# Patient Record
Sex: Female | Born: 1990 | Race: Black or African American | Hispanic: No | Marital: Single | Smoking: Former smoker
Health system: Southern US, Community
[De-identification: ages and names within clinical notes are randomized; demographics above are authoritative.]

## PROBLEM LIST (undated history)

## (undated) ENCOUNTER — Inpatient Hospital Stay (HOSPITAL_COMMUNITY): Payer: Self-pay

## (undated) ENCOUNTER — Emergency Department (HOSPITAL_COMMUNITY): Admission: EM | Payer: No Typology Code available for payment source | Source: Home / Self Care

## (undated) DIAGNOSIS — Z789 Other specified health status: Secondary | ICD-10-CM

## (undated) DIAGNOSIS — K259 Gastric ulcer, unspecified as acute or chronic, without hemorrhage or perforation: Secondary | ICD-10-CM

---
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
## 2012-12-13 ENCOUNTER — Other Ambulatory Visit: Payer: Self-pay | Admitting: Obstetrics and Gynecology

## 2012-12-13 DIAGNOSIS — N63 Unspecified lump in unspecified breast: Secondary | ICD-10-CM

## 2012-12-15 ENCOUNTER — Other Ambulatory Visit: Payer: Self-pay

## 2012-12-25 ENCOUNTER — Ambulatory Visit
Admission: RE | Admit: 2012-12-25 | Discharge: 2012-12-25 | Disposition: A | Discharge status: Home / Self Care | Payer: Commercial Indemnity | Source: Ambulatory Visit | Attending: Obstetrics and Gynecology | Admitting: Obstetrics and Gynecology

## 2012-12-25 DIAGNOSIS — N63 Unspecified lump in unspecified breast: Secondary | ICD-10-CM

## 2013-02-08 LAB — OB RESULTS CONSOLE HEPATITIS B SURFACE ANTIGEN: Hepatitis B Surface Ag: NEGATIVE

## 2013-02-08 LAB — OB RESULTS CONSOLE RPR: RPR: NONREACTIVE

## 2013-02-08 LAB — OB RESULTS CONSOLE RUBELLA ANTIBODY, IGM: Rubella: IMMUNE

## 2013-02-08 LAB — OB RESULTS CONSOLE ABO/RH: RH Type: POSITIVE

## 2013-02-08 LAB — OB RESULTS CONSOLE HIV ANTIBODY (ROUTINE TESTING): HIV: NONREACTIVE

## 2013-02-08 LAB — OB RESULTS CONSOLE ANTIBODY SCREEN: Antibody Screen: NEGATIVE

## 2013-07-05 NOTE — L&D Delivery Note (Signed)
Delivery Note At 11:36 AM a viable female was delivered via Vaginal, Spontaneous Delivery (Presentation: ;  ).  APGAR: 9, 9; weight .   Placenta status: Intact, Spontaneous.  Cord: 3 vessels with the following complications: Knot.  Cord pH: sent True knot in cord  Anesthesia: Epidural  Episiotomy: None Lacerations: 2nd degree Suture Repair: 3.0 vicryl rapide Est. Blood Loss (mL): 300  Mom to postpartum.  Baby to Nursery.  Meriel PicaHOLLAND,Rohen Kimes M 02/01/2014, 11:56 AM

## 2013-07-23 LAB — OB RESULTS CONSOLE ABO/RH: RH Type: POSITIVE

## 2013-07-23 LAB — OB RESULTS CONSOLE RPR: RPR: NONREACTIVE

## 2013-07-23 LAB — OB RESULTS CONSOLE HEPATITIS B SURFACE ANTIGEN: Hepatitis B Surface Ag: NEGATIVE

## 2013-07-23 LAB — OB RESULTS CONSOLE ANTIBODY SCREEN: ANTIBODY SCREEN: NEGATIVE

## 2013-07-23 LAB — OB RESULTS CONSOLE RUBELLA ANTIBODY, IGM: RUBELLA: IMMUNE

## 2013-07-23 LAB — OB RESULTS CONSOLE HIV ANTIBODY (ROUTINE TESTING): HIV: NONREACTIVE

## 2013-07-27 LAB — OB RESULTS CONSOLE GC/CHLAMYDIA
CHLAMYDIA, DNA PROBE: NEGATIVE
GC PROBE AMP, GENITAL: NEGATIVE

## 2014-01-10 LAB — OB RESULTS CONSOLE GBS: STREP GROUP B AG: NEGATIVE

## 2014-01-19 ENCOUNTER — Encounter (HOSPITAL_COMMUNITY): Payer: Self-pay | Admitting: *Deleted

## 2014-01-19 ENCOUNTER — Inpatient Hospital Stay (HOSPITAL_COMMUNITY)
Admission: AD | Admit: 2014-01-19 | Discharge: 2014-01-19 | Disposition: A | Discharge status: Home / Self Care | Payer: Managed Care, Other (non HMO) | Source: Ambulatory Visit | Attending: Obstetrics and Gynecology | Admitting: Obstetrics and Gynecology

## 2014-01-19 DIAGNOSIS — Z87891 Personal history of nicotine dependence: Secondary | ICD-10-CM | POA: Diagnosis not present

## 2014-01-19 DIAGNOSIS — O479 False labor, unspecified: Secondary | ICD-10-CM | POA: Insufficient documentation

## 2014-01-19 DIAGNOSIS — IMO0001 Reserved for inherently not codable concepts without codable children: Secondary | ICD-10-CM

## 2014-01-19 HISTORY — DX: Other specified health status: Z78.9

## 2014-01-19 LAB — CBC
HCT: 34.1 % — ABNORMAL LOW (ref 36.0–46.0)
Hemoglobin: 12.1 g/dL (ref 12.0–15.0)
MCH: 32 pg (ref 26.0–34.0)
MCHC: 35.5 g/dL (ref 30.0–36.0)
MCV: 90.2 fL (ref 78.0–100.0)
Platelets: 294 10*3/uL (ref 150–400)
RBC: 3.78 MIL/uL — ABNORMAL LOW (ref 3.87–5.11)
RDW: 13.2 % (ref 11.5–15.5)
WBC: 6.9 10*3/uL (ref 4.0–10.5)

## 2014-01-19 LAB — RPR

## 2014-01-19 MED ORDER — LACTATED RINGERS IV SOLN: 500.0000 mL | INTRAVENOUS | Status: DC | PRN

## 2014-01-19 MED ORDER — LACTATED RINGERS IV SOLN
INTRAVENOUS | Status: DC
  Administered 2014-01-19: 15:00:00 via INTRAVENOUS

## 2014-01-19 MED ORDER — LIDOCAINE HCL (PF) 1 % IJ SOLN
30.0000 mL | INTRAMUSCULAR | Status: DC | PRN
Start: 2014-01-19 — End: 2014-01-19

## 2014-01-19 MED ORDER — DIPHENHYDRAMINE HCL 50 MG/ML IJ SOLN: 12.5000 mg | INTRAMUSCULAR | Status: DC | PRN

## 2014-01-19 MED ORDER — EPHEDRINE 5 MG/ML INJ: 10.0000 mg | INTRAVENOUS | Status: DC | PRN

## 2014-01-19 MED ORDER — PHENYLEPHRINE 40 MCG/ML (10ML) SYRINGE FOR IV PUSH (FOR BLOOD PRESSURE SUPPORT): 80.0000 ug | PREFILLED_SYRINGE | INTRAVENOUS | Status: DC | PRN

## 2014-01-19 MED ORDER — ONDANSETRON HCL 4 MG/2ML IJ SOLN: 4.0000 mg | Freq: Four times a day (QID) | INTRAMUSCULAR | Status: DC | PRN

## 2014-01-19 MED ORDER — CITRIC ACID-SODIUM CITRATE 334-500 MG/5ML PO SOLN: 30.0000 mL | ORAL | Status: DC | PRN

## 2014-01-19 MED ORDER — OXYCODONE-ACETAMINOPHEN 5-325 MG PO TABS: 1.0000 | ORAL_TABLET | ORAL | Status: DC | PRN

## 2014-01-19 MED ORDER — IBUPROFEN 600 MG PO TABS: 600.0000 mg | ORAL_TABLET | Freq: Four times a day (QID) | ORAL | Status: DC | PRN

## 2014-01-19 MED ORDER — ACETAMINOPHEN 325 MG PO TABS: 650.0000 mg | ORAL_TABLET | ORAL | Status: DC | PRN

## 2014-01-19 MED ORDER — FLEET ENEMA 7-19 GM/118ML RE ENEM: 1.0000 | ENEMA | RECTAL | Status: DC | PRN

## 2014-01-19 MED ORDER — OXYTOCIN BOLUS FROM INFUSION: 500.0000 mL | INTRAVENOUS | Status: DC

## 2014-01-19 MED ORDER — LACTATED RINGERS IV SOLN: 500.0000 mL | Freq: Once | INTRAVENOUS | Status: DC

## 2014-01-19 MED ORDER — OXYTOCIN 40 UNITS IN LACTATED RINGERS INFUSION - SIMPLE MED: 62.5000 mL/h | INTRAVENOUS | Status: DC

## 2014-01-19 MED ORDER — FENTANYL 2.5 MCG/ML BUPIVACAINE 1/10 % EPIDURAL INFUSION (WH - ANES): 14.0000 mL/h | INTRAMUSCULAR | Status: DC | PRN

## 2014-01-19 NOTE — MAU Note (Signed)
Patient presents with complaint of contractions X 20 minutes.

## 2014-01-20 ENCOUNTER — Encounter (HOSPITAL_COMMUNITY): Payer: Self-pay | Admitting: *Deleted

## 2014-01-20 ENCOUNTER — Inpatient Hospital Stay (HOSPITAL_COMMUNITY)
Admission: AD | Admit: 2014-01-20 | Discharge: 2014-01-20 | Disposition: A | Discharge status: Home / Self Care | Payer: Managed Care, Other (non HMO) | Source: Ambulatory Visit | Attending: Obstetrics and Gynecology | Admitting: Obstetrics and Gynecology

## 2014-01-20 DIAGNOSIS — O479 False labor, unspecified: Secondary | ICD-10-CM | POA: Diagnosis present

## 2014-01-20 NOTE — Discharge Instructions (Signed)
Braxton Hicks Contractions °Contractions of the uterus can occur throughout pregnancy. Contractions are not always a sign that you are in labor.  °WHAT ARE BRAXTON HICKS CONTRACTIONS?  °Contractions that occur before labor are called Braxton Hicks contractions, or false labor. Toward the end of pregnancy (32-34 weeks), these contractions can develop more often and may become more forceful. This is not true labor because these contractions do not result in opening (dilatation) and thinning of the cervix. They are sometimes difficult to tell apart from true labor because these contractions can be forceful and people have different pain tolerances. You should not feel embarrassed if you go to the hospital with false labor. Sometimes, the only way to tell if you are in true labor is for your health care provider to look for changes in the cervix. °If there are no prenatal problems or other health problems associated with the pregnancy, it is completely safe to be sent home with false labor and await the onset of true labor. °HOW CAN YOU TELL THE DIFFERENCE BETWEEN TRUE AND FALSE LABOR? °False Labor °· The contractions of false labor are usually shorter and not as hard as those of true labor.   °· The contractions are usually irregular.   °· The contractions are often felt in the front of the lower abdomen and in the groin.   °· The contractions may go away when you walk around or change positions while lying down.   °· The contractions get weaker and are shorter lasting as time goes on.   °· The contractions do not usually become progressively stronger, regular, and closer together as with true labor.   °True Labor °· Contractions in true labor last 30-70 seconds, become very regular, usually become more intense, and increase in frequency.   °· The contractions do not go away with walking.   °· The discomfort is usually felt in the top of the uterus and spreads to the lower abdomen and low back.   °· True labor can be  determined by your health care provider with an exam. This will show that the cervix is dilating and getting thinner.   °WHAT TO REMEMBER °· Keep up with your usual exercises and follow other instructions given by your health care provider.   °· Take medicines as directed by your health care provider.   °· Keep your regular prenatal appointments.   °· Eat and drink lightly if you think you are going into labor.   °· If Braxton Hicks contractions are making you uncomfortable:   °¨ Change your position from lying down or resting to walking, or from walking to resting.   °¨ Sit and rest in a tub of warm water.   °¨ Drink 2-3 glasses of water. Dehydration may cause these contractions.   °¨ Do slow and deep breathing several times an hour.   °WHEN SHOULD I SEEK IMMEDIATE MEDICAL CARE? °Seek immediate medical care if: °· Your contractions become stronger, more regular, and closer together.   °· You have fluid leaking or gushing from your vagina.   °· You have a fever.   °· You pass blood-tinged mucus.   °· You have vaginal bleeding.   °· You have continuous abdominal pain.   °· You have low back pain that you never had before.   °· You feel your baby's head pushing down and causing pelvic pressure.   °· Your baby is not moving as much as it used to.   °Document Released: 06/21/2005 Document Revised: 06/26/2013 Document Reviewed: 04/02/2013 °ExitCare® Patient Information ©2015 [employer]. This information is not intended to replace advice given to you by your health care   provider. Make sure you discuss any questions you have with your health care provider. ° °

## 2014-01-23 ENCOUNTER — Encounter (HOSPITAL_COMMUNITY): Payer: Self-pay | Admitting: *Deleted

## 2014-01-23 ENCOUNTER — Inpatient Hospital Stay (HOSPITAL_COMMUNITY)
Admission: AD | Admit: 2014-01-23 | Discharge: 2014-01-24 | Disposition: A | Discharge status: Home / Self Care | Payer: Managed Care, Other (non HMO) | Source: Ambulatory Visit | Attending: Obstetrics and Gynecology | Admitting: Obstetrics and Gynecology

## 2014-01-23 DIAGNOSIS — O479 False labor, unspecified: Secondary | ICD-10-CM | POA: Insufficient documentation

## 2014-01-24 DIAGNOSIS — O479 False labor, unspecified: Secondary | ICD-10-CM | POA: Diagnosis present

## 2014-01-24 NOTE — Discharge Instructions (Signed)
Braxton Hicks Contractions Contractions of the uterus can occur throughout pregnancy. Contractions are not always a sign that you are in labor.  WHAT ARE BRAXTON HICKS CONTRACTIONS?  Contractions that occur before labor are called Braxton Hicks contractions, or false labor. Toward the end of pregnancy (32-34 weeks), these contractions can develop more often and may become more forceful. This is not true labor because these contractions do not result in opening (dilatation) and thinning of the cervix. They are sometimes difficult to tell apart from true labor because these contractions can be forceful and people have different pain tolerances. You should not feel embarrassed if you go to the hospital with false labor. Sometimes, the only way to tell if you are in true labor is for your health care provider to look for changes in the cervix. If there are no prenatal problems or other health problems associated with the pregnancy, it is completely safe to be sent home with false labor and await the onset of true labor. HOW CAN YOU TELL THE DIFFERENCE BETWEEN TRUE AND FALSE LABOR? False Labor  The contractions of false labor are usually shorter and not as hard as those of true labor.   The contractions are usually irregular.   The contractions are often felt in the front of the lower abdomen and in the groin.   The contractions may go away when you walk around or change positions while lying down.   The contractions get weaker and are shorter lasting as time goes on.   The contractions do not usually become progressively stronger, regular, and closer together as with true labor.  True Labor  Contractions in true labor last 30-70 seconds, become very regular, usually become more intense, and increase in frequency.   The contractions do not go away with walking.   The discomfort is usually felt in the top of the uterus and spreads to the lower abdomen and low back.   True labor can be  determined by your health care provider with an exam. This will show that the cervix is dilating and getting thinner.  WHAT TO REMEMBER  Keep up with your usual exercises and follow other instructions given by your health care provider.   Take medicines as directed by your health care provider.   Keep your regular prenatal appointments.   Eat and drink lightly if you think you are going into labor.   If Braxton Hicks contractions are making you uncomfortable:   Change your position from lying down or resting to walking, or from walking to resting.   Sit and rest in a tub of warm water.   Drink 2-3 glasses of water. Dehydration may cause these contractions.   Do slow and deep breathing several times an hour.  WHEN SHOULD I SEEK IMMEDIATE MEDICAL CARE? Seek immediate medical care if:  Your contractions become stronger, more regular, and closer together.   You have fluid leaking or gushing from your vagina.   You have a fever.   You pass blood-tinged mucus.   You have vaginal bleeding.   You have continuous abdominal pain.   You have low back pain that you never had before.   You feel your baby's head pushing down and causing pelvic pressure.   Your baby is not moving as much as it used to.  Document Released: 06/21/2005 Document Revised: 06/26/2013 Document Reviewed: 04/02/2013 ExitCare Patient Information 2015 [employer]. This information is not intended to replace advice given to you by your health care   provider. Make sure you discuss any questions you have with your health care provider.  Fetal Movement Counts Patient Name: __________________________________________________ Patient Due Date: ____________________ Performing a fetal movement count is highly recommended in high-risk pregnancies, but it is good for every pregnant woman to do. Your health care provider may ask you to start counting fetal movements at 28 weeks of the pregnancy. Fetal  movements often increase:  After eating a full meal.  After physical activity.  After eating or drinking something sweet or cold.  At rest. Pay attention to when you feel the baby is most active. This will help you notice a pattern of your baby's sleep and wake cycles and what factors contribute to an increase in fetal movement. It is important to perform a fetal movement count at the same time each day when your baby is normally most active.  HOW TO COUNT FETAL MOVEMENTS 1. Find a quiet and comfortable area to sit or lie down on your left side. Lying on your left side provides the best blood and oxygen circulation to your baby. 2. Write down the day and time on a sheet of paper or in a journal. 3. Start counting kicks, flutters, swishes, rolls, or jabs in a 2-hour period. You should feel at least 10 movements within 2 hours. 4. If you do not feel 10 movements in 2 hours, wait 2-3 hours and count again. Look for a change in the pattern or not enough counts in 2 hours. SEEK MEDICAL CARE IF:  You feel less than 10 counts in 2 hours, tried twice.  There is no movement in over an hour.  The pattern is changing or taking longer each day to reach 10 counts in 2 hours.  You feel the baby is not moving as he or she usually does. Date: ____________ Movements: ____________ Start time: ____________ Finish time: ____________  Date: ____________ Movements: ____________ Start time: ____________ Finish time: ____________ Date: ____________ Movements: ____________ Start time: ____________ Finish time: ____________ Date: ____________ Movements: ____________ Start time: ____________ Finish time: ____________ Date: ____________ Movements: ____________ Start time: ____________ Finish time: ____________ Date: ____________ Movements: ____________ Start time: ____________ Finish time: ____________ Date: ____________ Movements: ____________ Start time: ____________ Finish time: ____________ Date: ____________  Movements: ____________ Start time: ____________ Finish time: ____________  Date: ____________ Movements: ____________ Start time: ____________ Finish time: ____________ Date: ____________ Movements: ____________ Start time: ____________ Finish time: ____________ Date: ____________ Movements: ____________ Start time: ____________ Finish time: ____________ Date: ____________ Movements: ____________ Start time: ____________ Finish time: ____________ Date: ____________ Movements: ____________ Start time: ____________ Finish time: ____________ Date: ____________ Movements: ____________ Start time: ____________ Finish time: ____________ Date: ____________ Movements: ____________ Start time: ____________ Finish time: ____________  Date: ____________ Movements: ____________ Start time: ____________ Finish time: ____________ Date: ____________ Movements: ____________ Start time: ____________ Finish time: ____________ Date: ____________ Movements: ____________ Start time: ____________ Finish time: ____________ Date: ____________ Movements: ____________ Start time: ____________ Finish time: ____________ Date: ____________ Movements: ____________ Start time: ____________ Finish time: ____________ Date: ____________ Movements: ____________ Start time: ____________ Finish time: ____________ Date: ____________ Movements: ____________ Start time: ____________ Finish time: ____________  Date: ____________ Movements: ____________ Start time: ____________ Finish time: ____________ Date: ____________ Movements: ____________ Start time: ____________ Finish time: ____________ Date: ____________ Movements: ____________ Start time: ____________ Finish time: ____________ Date: ____________ Movements: ____________ Start time: ____________ Finish time: ____________ Date: ____________ Movements: ____________ Start time: ____________ Finish time: ____________ Date: ____________ Movements: ____________ Start time:  ____________ Finish time: ____________ Date: ____________ Movements:   ____________ Start time: ____________ Finish time: ____________  Date: ____________ Movements: ____________ Start time: ____________ Finish time: ____________ Date: ____________ Movements: ____________ Start time: ____________ Finish time: ____________ Date: ____________ Movements: ____________ Start time: ____________ Finish time: ____________ Date: ____________ Movements: ____________ Start time: ____________ Finish time: ____________ Date: ____________ Movements: ____________ Start time: ____________ Finish time: ____________ Date: ____________ Movements: ____________ Start time: ____________ Finish time: ____________ Date: ____________ Movements: ____________ Start time: ____________ Finish time: ____________  Date: ____________ Movements: ____________ Start time: ____________ Finish time: ____________ Date: ____________ Movements: ____________ Start time: ____________ Finish time: ____________ Date: ____________ Movements: ____________ Start time: ____________ Finish time: ____________ Date: ____________ Movements: ____________ Start time: ____________ Finish time: ____________ Date: ____________ Movements: ____________ Start time: ____________ Finish time: ____________ Date: ____________ Movements: ____________ Start time: ____________ Finish time: ____________ Date: ____________ Movements: ____________ Start time: ____________ Finish time: ____________  Date: ____________ Movements: ____________ Start time: ____________ Finish time: ____________ Date: ____________ Movements: ____________ Start time: ____________ Finish time: ____________ Date: ____________ Movements: ____________ Start time: ____________ Finish time: ____________ Date: ____________ Movements: ____________ Start time: ____________ Finish time: ____________ Date: ____________ Movements: ____________ Start time: ____________ Finish time: ____________ Date:  ____________ Movements: ____________ Start time: ____________ Finish time: ____________ Date: ____________ Movements: ____________ Start time: ____________ Finish time: ____________  Date: ____________ Movements: ____________ Start time: ____________ Finish time: ____________ Date: ____________ Movements: ____________ Start time: ____________ Finish time: ____________ Date: ____________ Movements: ____________ Start time: ____________ Finish time: ____________ Date: ____________ Movements: ____________ Start time: ____________ Finish time: ____________ Date: ____________ Movements: ____________ Start time: ____________ Finish time: ____________ Date: ____________ Movements: ____________ Start time: ____________ Finish time: ____________ Document Released: 07/21/2006 Document Revised: 11/05/2013 Document Reviewed: 04/17/2012 ExitCare Patient Information 2015 [employer]. This information is not intended to replace advice given to you by your health care provider. Make sure you discuss any questions you have with your health care provider.  

## 2014-01-25 ENCOUNTER — Inpatient Hospital Stay (HOSPITAL_COMMUNITY)
Admission: AD | Admit: 2014-01-25 | Discharge: 2014-01-25 | Disposition: A | Discharge status: Home / Self Care | Payer: Managed Care, Other (non HMO) | Source: Ambulatory Visit | Attending: Obstetrics and Gynecology | Admitting: Obstetrics and Gynecology

## 2014-01-25 ENCOUNTER — Encounter (HOSPITAL_COMMUNITY): Payer: Self-pay | Admitting: *Deleted

## 2014-01-25 DIAGNOSIS — Z87891 Personal history of nicotine dependence: Secondary | ICD-10-CM | POA: Diagnosis not present

## 2014-01-25 DIAGNOSIS — R51 Headache: Secondary | ICD-10-CM | POA: Diagnosis present

## 2014-01-25 DIAGNOSIS — O26893 Other specified pregnancy related conditions, third trimester: Secondary | ICD-10-CM

## 2014-01-25 DIAGNOSIS — O471 False labor at or after 37 completed weeks of gestation: Secondary | ICD-10-CM

## 2014-01-25 DIAGNOSIS — O479 False labor, unspecified: Secondary | ICD-10-CM | POA: Diagnosis not present

## 2014-01-25 DIAGNOSIS — R519 Headache, unspecified: Secondary | ICD-10-CM

## 2014-01-25 LAB — URINALYSIS, ROUTINE W REFLEX MICROSCOPIC
BILIRUBIN URINE: NEGATIVE
Glucose, UA: NEGATIVE mg/dL
Ketones, ur: NEGATIVE mg/dL
Leukocytes, UA: NEGATIVE
NITRITE: NEGATIVE
Protein, ur: NEGATIVE mg/dL
SPECIFIC GRAVITY, URINE: 1.025 (ref 1.005–1.030)
Urobilinogen, UA: 0.2 mg/dL (ref 0.0–1.0)
pH: 6 (ref 5.0–8.0)

## 2014-01-25 LAB — URINE MICROSCOPIC-ADD ON

## 2014-01-25 MED ORDER — ACETAMINOPHEN 325 MG PO TABS
650.0000 mg | ORAL_TABLET | Freq: Once | ORAL | Status: AC
  Administered 2014-01-25: 650 mg via ORAL
  Filled 2014-01-25: qty 2

## 2014-01-25 MED ORDER — M.V.I. ADULT IV INJ: Freq: Once | INTRAVENOUS | Status: DC

## 2014-01-25 NOTE — MAU Provider Note (Signed)
History     CSN: [id number]  Arrival date and time: 01/25/14 1622   First Provider Initiated Contact with Patient 01/25/14 1719      Chief Complaint  Patient presents with  . Headache  . Abdominal Pain  . Leg Swelling   HPI Erica Clements is 23 y.o. G1P0 [id number] weeks presenting with headache, lower pelvic pressure and feet are swollen.  She is a patient of Dr. Lynnell DikeGrewal's last seen 2 days ago.  She report uncomplicated pregnancy until 1 week ago when she presented to MAU for contractions.  She reports she was 4cm on that date.  She was seen in the office Wednesday.  Came in that night for contractions, 5cm that night.  She did not take Tylenol because "it never works.   Headache began suddenly 3pm.  Describes a sharp on her left side, not affecting her vision.  Rates the headache at its worse 8/10 and now 5/10.  Denies leaking of fluid or vaginal bleeding but has seen more mucous.  Feet are "less swollen today than they have been".  She has not had anything to drink today.   Past Medical History  Diagnosis Date  . Medical history non-contributory     Past Surgical History  Procedure Laterality Date  . No past surgeries      Family History  Problem Relation Age of Onset  . Cancer Paternal Grandmother   . Diabetes Paternal Grandmother     History  Substance Use Topics  . Smoking status: Former Games developermoker  . Smokeless tobacco: Never Used  . Alcohol Use: Yes     Comment: Socially    Allergies: No Known Allergies  Prescriptions prior to admission  Medication Sig Dispense Refill  . Prenatal Vit-Fe Fumarate-FA (PRENATAL MULTIVITAMIN) TABS tablet Take 1 tablet by mouth daily at 12 noon.        Review of Systems  Constitutional: Negative for fever and chills.  Cardiovascular: Positive for leg swelling (feet bilaterally).  Gastrointestinal: Positive for abdominal pain (lower abdominal pressure).  Genitourinary: Negative for dysuria, urgency and frequency.       Negative for vaginal  bleeding or loss of fluid.  + for mucous  Neurological: Positive for headaches.   Physical Exam   Blood pressure 94/51, pulse 99, temperature 99.3 F (37.4 C), temperature source Oral, resp. rate 16, last menstrual period 05/04/2013, SpO2 99.00%.  Physical Exam  Vitals reviewed. Constitutional: She is oriented to person, place, and time. She appears well-developed and well-nourished. No distress.  HENT:  Head: Normocephalic.  Neck: Normal range of motion.  Cardiovascular: Normal rate.   Respiratory: Effort normal.  Genitourinary:  Cervical exam by Scarlet, RN--5cm dilated, 70% effaced -1 - -2 station.  Neg for vaginal bleeding  Musculoskeletal: She exhibits edema (minimal edema in feet bilaterally).  Neurological: She is alert and oriented to person, place, and time. Coordination normal.  Skin: Skin is warm and dry.  Psychiatric: She has a normal mood and affect. Her behavior is normal.   NST--reactive.  Baseline 135 with mod variability.  Uterine irritability Results for orders placed during the hospital encounter of 01/25/14 (from the past 24 hour(s))  URINALYSIS, ROUTINE W REFLEX MICROSCOPIC     Status: Abnormal   Collection Time    01/25/14  4:55 PM      Result Value Ref Range   Color, Urine YELLOW  YELLOW   APPearance CLEAR  CLEAR   Specific Gravity, Urine 1.025  1.005 - 1.030   pH 6.0  5.0 - 8.0   Glucose, UA NEGATIVE  NEGATIVE mg/dL   Hgb urine dipstick TRACE (*) NEGATIVE   Bilirubin Urine NEGATIVE  NEGATIVE   Ketones, ur NEGATIVE  NEGATIVE mg/dL   Protein, ur NEGATIVE  NEGATIVE mg/dL   Urobilinogen, UA 0.2  0.0 - 1.0 mg/dL   Nitrite NEGATIVE  NEGATIVE   Leukocytes, UA NEGATIVE  NEGATIVE  URINE MICROSCOPIC-ADD ON     Status: Abnormal   Collection Time    01/25/14  4:55 PM      Result Value Ref Range   Squamous Epithelial / LPF FEW (*) RARE   WBC, UA 0-2  <3 WBC/hpf   Bacteria, UA FEW (*) RARE   MAU Course  Procedures  Tylenol 650mg  po given in MAU for  headache.  MDM Consulted with Dr. Rana Snare at 17:40.  BP is wnl, NST reactive.  Encourage her to drink "around the clock".  Keep scheduled appt in the office and call for worsening sxs/vaginal bleeding, leaking of fluid or decreased fetal movement.  May discharge to home when UA back.     Reviewed excellent blood pressure readings, NST and very little swelling in her feel.    Assessment and Plan  A:  Lower pelvic pressure at [redacted] weeks gestation       Braxton-Hicks contractions      Headache with normal BP and Reactive NST       P:  Encouraged her to drink very often to reduce cramping and headache per Dr. Rana Snare.      Return for worsening sxs, leaking of fluid, vaginal bleeding or decrease fetal movement.      Keep scheduled appointment KEY,EVE M 01/25/2014, 5:20 PM

## 2014-01-25 NOTE — MAU Note (Signed)
Patient states she has been having a lot of lower abdominal pressure. Started having a headache about one hour ago. Has a heavier than usual vaginal discharge. Denies bleeding or leaking and reports good fetal movement.

## 2014-01-25 NOTE — Discharge Instructions (Signed)
Braxton Hicks Contractions °Contractions of the uterus can occur throughout pregnancy. Contractions are not always a sign that you are in labor.  °WHAT ARE BRAXTON HICKS CONTRACTIONS?  °Contractions that occur before labor are called Braxton Hicks contractions, or false labor. Toward the end of pregnancy (32-34 weeks), these contractions can develop more often and may become more forceful. This is not true labor because these contractions do not result in opening (dilatation) and thinning of the cervix. They are sometimes difficult to tell apart from true labor because these contractions can be forceful and people have different pain tolerances. You should not feel embarrassed if you go to the hospital with false labor. Sometimes, the only way to tell if you are in true labor is for your health care provider to look for changes in the cervix. °If there are no prenatal problems or other health problems associated with the pregnancy, it is completely safe to be sent home with false labor and await the onset of true labor. °HOW CAN YOU TELL THE DIFFERENCE BETWEEN TRUE AND FALSE LABOR? °False Labor °· The contractions of false labor are usually shorter and not as hard as those of true labor.   °· The contractions are usually irregular.   °· The contractions are often felt in the front of the lower abdomen and in the groin.   °· The contractions may go away when you walk around or change positions while lying down.   °· The contractions get weaker and are shorter lasting as time goes on.   °· The contractions do not usually become progressively stronger, regular, and closer together as with true labor.   °True Labor °· Contractions in true labor last 30-70 seconds, become very regular, usually become more intense, and increase in frequency.   °· The contractions do not go away with walking.   °· The discomfort is usually felt in the top of the uterus and spreads to the lower abdomen and low back.   °· True labor can be  determined by your health care provider with an exam. This will show that the cervix is dilating and getting thinner.   °WHAT TO REMEMBER °· Keep up with your usual exercises and follow other instructions given by your health care provider.   °· Take medicines as directed by your health care provider.   °· Keep your regular prenatal appointments.   °· Eat and drink lightly if you think you are going into labor.   °· If Braxton Hicks contractions are making you uncomfortable:   °¨ Change your position from lying down or resting to walking, or from walking to resting.   °¨ Sit and rest in a tub of warm water.   °¨ Drink 2-3 glasses of water. Dehydration may cause these contractions.   °¨ Do slow and deep breathing several times an hour.   °WHEN SHOULD I SEEK IMMEDIATE MEDICAL CARE? °Seek immediate medical care if: °· Your contractions become stronger, more regular, and closer together.   °· You have fluid leaking or gushing from your vagina.   °· You have a fever.   °· You pass blood-tinged mucus.   °· You have vaginal bleeding.   °· You have continuous abdominal pain.   °· You have low back pain that you never had before.   °· You feel your baby's head pushing down and causing pelvic pressure.   °· Your baby is not moving as much as it used to.   °Document Released: 06/21/2005 Document Revised: 06/26/2013 Document Reviewed: 04/02/2013 °Exit[employer]® Patient Information ©2015 [employer]. This information is not intended to replace advice given to you by your health care   provider. Make sure you discuss any questions you have with your health care provider.  IT IS VERY IMPORTANT TO STAY WELL HYDRATED__6-8 glasses of water a day

## 2014-01-25 NOTE — MAU Note (Signed)
Patient states she was 5.5 cm on 7-22.

## 2014-01-30 NOTE — H&P (Signed)
Erica HarborKeisha Clements is a 23 y.o. female presenting AT 6237 WEEKS WITH POSSIBLE EARLY LABOR Maternal Medical History:  Reason for admission: Contractions.   Contractions: Onset was 3-5 hours ago.   Frequency: irregular.   Perceived severity is moderate.    Fetal activity: Perceived fetal activity is normal.    Prenatal complications: no prenatal complications Prenatal Complications - Diabetes: none.    OB History   Grav Para Term Preterm Abortions TAB SAB Ect Mult Living   1              Past Medical History  Diagnosis Date  . Medical history non-contributory    Past Surgical History  Procedure Laterality Date  . No past surgeries     Family History: family history includes Cancer in her paternal grandmother; Diabetes in her paternal grandmother. Social History:  reports that she has quit smoking. She has never used smokeless tobacco. She reports that she drinks alcohol. She reports that she does not use illicit drugs.   Prenatal Transfer Tool  Maternal Diabetes: No Genetic Screening: Normal Maternal Ultrasounds/Referrals: Normal Fetal Ultrasounds or other Referrals:  None Maternal Substance Abuse:  No Significant Maternal Medications:  None Significant Maternal Lab Results:  None Other Comments:  None  ROS  Dilation: 4.5 Effacement (%): 60 Station: -1 Exam by:: Erica Clements. Brown RNC Blood pressure 105/65, pulse 109, temperature 98.8 F (37.1 C), temperature source Oral, resp. rate 18, height 5\' 5"  (1.651 m), weight 68.493 kg (151 lb), last menstrual period 05/04/2013. Maternal Exam:  Uterine Assessment: Contraction strength is mild.  Contraction frequency is irregular.   Abdomen: Fundal height is 6.   Fetal presentation: vertex  Pelvis: adequate for delivery.   Cervix: CERVIX 2-3  Fetal Exam Fetal State Assessment: Category I - tracings are normal.     Physical Exam  Prenatal labs: ABO, Rh: B/Positive/-- (01/19 0000) Antibody: Negative (01/19 0000) Rubella: Immune  (01/19 0000) RPR: NON REAC (07/18 1435)  HBsAg: Negative (01/19 0000)  HIV: Non-reactive (01/19 0000)  GBS: Negative (07/09 0000)   Assessment/Plan: IUP AT 37 WEEKS EARLY LABOR?   Erica Clements S 01/30/2014, 4:03 PM

## 2014-01-31 ENCOUNTER — Telehealth (HOSPITAL_COMMUNITY): Payer: Self-pay | Admitting: *Deleted

## 2014-01-31 NOTE — Telephone Encounter (Signed)
Preadmission screen  

## 2014-02-01 ENCOUNTER — Inpatient Hospital Stay (HOSPITAL_COMMUNITY)
Admission: AD | Admit: 2014-02-01 | Discharge: 2014-02-03 | DRG: 775 | Disposition: A | Discharge status: Home / Self Care | Payer: Managed Care, Other (non HMO) | Source: Ambulatory Visit | Attending: Obstetrics and Gynecology | Admitting: Obstetrics and Gynecology

## 2014-02-01 ENCOUNTER — Inpatient Hospital Stay (HOSPITAL_COMMUNITY): Admission: RE | Admit: 2014-02-01 | Payer: Managed Care, Other (non HMO) | Source: Ambulatory Visit

## 2014-02-01 ENCOUNTER — Encounter (HOSPITAL_COMMUNITY): Payer: Self-pay | Admitting: *Deleted

## 2014-02-01 ENCOUNTER — Encounter (HOSPITAL_COMMUNITY): Payer: Managed Care, Other (non HMO) | Admitting: Anesthesiology

## 2014-02-01 ENCOUNTER — Inpatient Hospital Stay (HOSPITAL_COMMUNITY): Payer: Managed Care, Other (non HMO) | Admitting: Anesthesiology

## 2014-02-01 DIAGNOSIS — O479 False labor, unspecified: Secondary | ICD-10-CM | POA: Diagnosis present

## 2014-02-01 DIAGNOSIS — O0990 Supervision of high risk pregnancy, unspecified, unspecified trimester: Secondary | ICD-10-CM

## 2014-02-01 DIAGNOSIS — Z87891 Personal history of nicotine dependence: Secondary | ICD-10-CM

## 2014-02-01 DIAGNOSIS — Z833 Family history of diabetes mellitus: Secondary | ICD-10-CM

## 2014-02-01 LAB — TYPE AND SCREEN
ABO/RH(D): B POS
Antibody Screen: NEGATIVE

## 2014-02-01 LAB — SAMPLE TO BLOOD BANK

## 2014-02-01 LAB — RPR

## 2014-02-01 LAB — CBC
HEMATOCRIT: 35.8 % — AB (ref 36.0–46.0)
Hemoglobin: 12.6 g/dL (ref 12.0–15.0)
MCH: 31.3 pg (ref 26.0–34.0)
MCHC: 35.2 g/dL (ref 30.0–36.0)
MCV: 89.1 fL (ref 78.0–100.0)
Platelets: 346 10*3/uL (ref 150–400)
RBC: 4.02 MIL/uL (ref 3.87–5.11)
RDW: 13.1 % (ref 11.5–15.5)
WBC: 7.3 10*3/uL (ref 4.0–10.5)

## 2014-02-01 LAB — ABO/RH: ABO/RH(D): B POS

## 2014-02-01 MED ORDER — LACTATED RINGERS IV SOLN: 500.0000 mL | INTRAVENOUS | Status: DC | PRN

## 2014-02-01 MED ORDER — DIBUCAINE 1 % RE OINT
1.0000 "application " | TOPICAL_OINTMENT | RECTAL | Status: DC | PRN
  Administered 2014-02-01 – 2014-02-03 (×2): 1 via RECTAL
  Filled 2014-02-01 (×2): qty 28

## 2014-02-01 MED ORDER — LACTATED RINGERS IV SOLN: INTRAVENOUS | Status: DC

## 2014-02-01 MED ORDER — LIDOCAINE HCL (PF) 1 % IJ SOLN
30.0000 mL | INTRAMUSCULAR | Status: DC | PRN
  Administered 2014-02-01: 30 mL via SUBCUTANEOUS
  Filled 2014-02-01: qty 30

## 2014-02-01 MED ORDER — OXYTOCIN 40 UNITS IN LACTATED RINGERS INFUSION - SIMPLE MED
62.5000 mL/h | INTRAVENOUS | Status: DC
  Administered 2014-02-01: 62.5 mL/h via INTRAVENOUS
  Filled 2014-02-01: qty 1000

## 2014-02-01 MED ORDER — EPHEDRINE 5 MG/ML INJ
10.0000 mg | INTRAVENOUS | Status: DC | PRN
  Filled 2014-02-01: qty 2
  Filled 2014-02-01: qty 4

## 2014-02-01 MED ORDER — ZOLPIDEM TARTRATE 5 MG PO TABS: 5.0000 mg | ORAL_TABLET | Freq: Every evening | ORAL | Status: DC | PRN

## 2014-02-01 MED ORDER — CITRIC ACID-SODIUM CITRATE 334-500 MG/5ML PO SOLN: 30.0000 mL | ORAL | Status: DC | PRN

## 2014-02-01 MED ORDER — ONDANSETRON HCL 4 MG/2ML IJ SOLN: 4.0000 mg | INTRAMUSCULAR | Status: DC | PRN

## 2014-02-01 MED ORDER — ONDANSETRON HCL 4 MG/2ML IJ SOLN: 4.0000 mg | Freq: Four times a day (QID) | INTRAMUSCULAR | Status: DC | PRN

## 2014-02-01 MED ORDER — ACETAMINOPHEN 325 MG PO TABS: 650.0000 mg | ORAL_TABLET | ORAL | Status: DC | PRN

## 2014-02-01 MED ORDER — PHENYLEPHRINE 40 MCG/ML (10ML) SYRINGE FOR IV PUSH (FOR BLOOD PRESSURE SUPPORT)
80.0000 ug | PREFILLED_SYRINGE | INTRAVENOUS | Status: DC | PRN
  Filled 2014-02-01: qty 2
  Filled 2014-02-01: qty 10

## 2014-02-01 MED ORDER — BISACODYL 10 MG RE SUPP: 10.0000 mg | Freq: Every day | RECTAL | Status: DC | PRN

## 2014-02-01 MED ORDER — LIDOCAINE HCL (PF) 1 % IJ SOLN
INTRAMUSCULAR | Status: DC | PRN
  Administered 2014-02-01 (×2): 5 mL

## 2014-02-01 MED ORDER — FENTANYL 2.5 MCG/ML BUPIVACAINE 1/10 % EPIDURAL INFUSION (WH - ANES)
14.0000 mL/h | INTRAMUSCULAR | Status: DC | PRN
  Administered 2014-02-01: 14 mL/h via EPIDURAL
  Filled 2014-02-01: qty 125

## 2014-02-01 MED ORDER — LACTATED RINGERS IV SOLN: 500.0000 mL | Freq: Once | INTRAVENOUS | Status: DC

## 2014-02-01 MED ORDER — OXYCODONE-ACETAMINOPHEN 5-325 MG PO TABS: 1.0000 | ORAL_TABLET | ORAL | Status: DC | PRN

## 2014-02-01 MED ORDER — SODIUM CHLORIDE 0.9 % IV SOLN: Freq: Once | INTRAVENOUS | Status: DC

## 2014-02-01 MED ORDER — PRENATAL MULTIVITAMIN CH
1.0000 | ORAL_TABLET | Freq: Every day | ORAL | Status: DC
  Administered 2014-02-02 – 2014-02-03 (×2): 1 via ORAL
  Filled 2014-02-01 (×2): qty 1

## 2014-02-01 MED ORDER — OXYCODONE-ACETAMINOPHEN 5-325 MG PO TABS
1.0000 | ORAL_TABLET | Freq: Four times a day (QID) | ORAL | Status: DC | PRN
  Administered 2014-02-02 – 2014-02-03 (×5): 1 via ORAL
  Filled 2014-02-01 (×6): qty 1

## 2014-02-01 MED ORDER — EPHEDRINE 5 MG/ML INJ
10.0000 mg | INTRAVENOUS | Status: DC | PRN
  Filled 2014-02-01: qty 2

## 2014-02-01 MED ORDER — BENZOCAINE-MENTHOL 20-0.5 % EX AERO
1.0000 "application " | INHALATION_SPRAY | CUTANEOUS | Status: DC | PRN
  Administered 2014-02-01: 1 via TOPICAL
  Filled 2014-02-01: qty 56

## 2014-02-01 MED ORDER — PHENYLEPHRINE 40 MCG/ML (10ML) SYRINGE FOR IV PUSH (FOR BLOOD PRESSURE SUPPORT)
80.0000 ug | PREFILLED_SYRINGE | INTRAVENOUS | Status: DC | PRN
  Filled 2014-02-01: qty 2

## 2014-02-01 MED ORDER — WITCH HAZEL-GLYCERIN EX PADS
1.0000 "application " | MEDICATED_PAD | CUTANEOUS | Status: DC | PRN
  Administered 2014-02-01 – 2014-02-03 (×2): 1 via TOPICAL

## 2014-02-01 MED ORDER — OXYTOCIN BOLUS FROM INFUSION: 500.0000 mL | INTRAVENOUS | Status: DC

## 2014-02-01 MED ORDER — FLEET ENEMA 7-19 GM/118ML RE ENEM: 1.0000 | ENEMA | Freq: Every day | RECTAL | Status: DC | PRN

## 2014-02-01 MED ORDER — ONDANSETRON HCL 4 MG PO TABS: 4.0000 mg | ORAL_TABLET | ORAL | Status: DC | PRN

## 2014-02-01 MED ORDER — LANOLIN HYDROUS EX OINT: TOPICAL_OINTMENT | CUTANEOUS | Status: DC | PRN

## 2014-02-01 MED ORDER — MEASLES, MUMPS & RUBELLA VAC ~~LOC~~ INJ: 0.5000 mL | INJECTION | Freq: Once | SUBCUTANEOUS | Status: DC

## 2014-02-01 MED ORDER — SIMETHICONE 80 MG PO CHEW
80.0000 mg | CHEWABLE_TABLET | ORAL | Status: DC | PRN
Start: 2014-02-01 — End: 2014-02-03

## 2014-02-01 MED ORDER — FLEET ENEMA 7-19 GM/118ML RE ENEM: 1.0000 | ENEMA | RECTAL | Status: DC | PRN

## 2014-02-01 MED ORDER — DIPHENHYDRAMINE HCL 50 MG/ML IJ SOLN: 12.5000 mg | INTRAMUSCULAR | Status: DC | PRN

## 2014-02-01 MED ORDER — TETANUS-DIPHTH-ACELL PERTUSSIS 5-2.5-18.5 LF-MCG/0.5 IM SUSP
0.5000 mL | Freq: Once | INTRAMUSCULAR | Status: DC
  Filled 2014-02-01: qty 0.5

## 2014-02-01 MED ORDER — IBUPROFEN 600 MG PO TABS: 600.0000 mg | ORAL_TABLET | Freq: Four times a day (QID) | ORAL | Status: DC | PRN

## 2014-02-01 MED ORDER — DIPHENHYDRAMINE HCL 25 MG PO CAPS: 25.0000 mg | ORAL_CAPSULE | Freq: Four times a day (QID) | ORAL | Status: DC | PRN

## 2014-02-01 MED ORDER — SENNOSIDES-DOCUSATE SODIUM 8.6-50 MG PO TABS
2.0000 | ORAL_TABLET | ORAL | Status: DC
  Administered 2014-02-02 (×2): 2 via ORAL
  Filled 2014-02-01 (×2): qty 2

## 2014-02-01 MED ORDER — IBUPROFEN 800 MG PO TABS
800.0000 mg | ORAL_TABLET | Freq: Three times a day (TID) | ORAL | Status: DC | PRN
  Administered 2014-02-01 – 2014-02-03 (×5): 800 mg via ORAL
  Filled 2014-02-01 (×5): qty 1

## 2014-02-01 NOTE — Lactation Note (Signed)
This note was copied from the chart of Erica Clements. Lactation Consultation Note  Infant sleeping with pacifier upon entering the room.  Provided education on waiting with pacifier use. Mother has been taught hand expression. Reviewed cluster feeding, basics, Mom encouraged to feed baby 8-12 times/24 hours and with feeding cues for more than 10 min.  Mother states one of her  [name] glands on her left lateral areola a few weeks ago has a white head on it which mother squeezed.  Now it is tender.  No redness noted. Recommended Mother speak with her MD regarding tenderness. Mom made aware of O/P services, breastfeeding support groups, community resources, and our phone # for post-discharge questions.      Patient Name: Erica Clements's Date: 02/01/2014 Reason for consult: Initial assessment   Maternal Data    Feeding Feeding Type: Formula (mom request formula) Length of feed: 15 min  LATCH Score/Interventions Latch: Grasps breast easily, tongue down, lips flanged, rhythmical sucking.  Audible Swallowing: A few with stimulation Intervention(s): Skin to skin;Hand expression;Alternate breast massage  Type of Nipple: Everted at rest and after stimulation  Comfort (Breast/Nipple): Soft / non-tender     Hold (Positioning): Assistance needed to correctly position infant at breast and maintain latch. Intervention(s): Support Pillows;Skin to skin;Position options;Breastfeeding basics reviewed  LATCH Score: 8  Lactation Tools Discussed/Used     Consult Status Consult Status: Follow-up Date: 02/02/14 Follow-up type: In-patient    [name], Ruth [facility] 02/01/2014, 6:19 PM

## 2014-02-01 NOTE — H&P (Signed)
Erica Clements is a 23 y.o. female presenting for AROM. Maternal Medical History:  Contractions: Perceived severity is mild.    Fetal activity: Perceived fetal activity is normal.   Last perceived fetal movement was within the past hour.      OB History   Grav Para Term Preterm Abortions TAB SAB Ect Mult Living   1              Past Medical History  Diagnosis Date  . Medical history non-contributory    Past Surgical History  Procedure Laterality Date  . No past surgeries     Family History: family history includes Cancer in her paternal grandmother; Diabetes in her paternal grandmother. Social History:  reports that she has quit smoking. She has never used smokeless tobacco. She reports that she drinks alcohol. She reports that she does not use illicit drugs.   Prenatal Transfer Tool  Maternal Diabetes: No Genetic Screening: Normal Maternal Ultrasounds/Referrals: Normal Fetal Ultrasounds or other Referrals:  None Maternal Substance Abuse:  No Significant Maternal Medications:  None Significant Maternal Lab Results:  None Other Comments:  None  ROS    Blood pressure 114/64, pulse 89, temperature 98.6 F (37 C), resp. rate 16, height 5\' 5"  (1.651 m), weight 152 lb (68.947 kg), last menstrual period 05/04/2013. Maternal Exam:  Uterine Assessment: Contraction strength is mild.  Abdomen: Patient reports no abdominal tenderness. Fundal height is term FH.   Estimated fetal weight is AGA.   Fetal presentation: vertex  Introitus: Normal vulva. Normal vagina.  Cervix: Cervix evaluated by digital exam.     Physical Exam  Constitutional: She is oriented to person, place, and time. She appears well-developed and well-nourished.  HENT:  Head: Normocephalic and atraumatic.  Neck: Neck supple.  Cardiovascular: Normal rate and regular rhythm.   Respiratory: Effort normal and breath sounds normal.  GI:  Term FH/ FHR 148  Genitourinary:  5/C/-1  AROM>>clr  Musculoskeletal:  Normal range of motion.  Neurological: She is alert and oriented to person, place, and time.    Prenatal labs: ABO, Rh: B/Positive/-- (01/19 0000) Antibody: Negative (01/19 0000) Rubella: Immune (01/19 0000) RPR: NON REAC (07/18 1435)  HBsAg: Negative (01/19 0000)  HIV: Non-reactive (01/19 0000)  GBS: Negative (07/09 0000)   Assessment/Plan: Term IUP, fav cx , for AROM   Lauranne Beyersdorf M 02/01/2014, 7:56 AM

## 2014-02-01 NOTE — Anesthesia Procedure Notes (Signed)
Epidural Patient location during procedure: OB Start time: 02/01/2014 8:55 AM  Staffing Anesthesiologist: Brayton CavesJACKSON, Sheana Bir Performed by: anesthesiologist   Preanesthetic Checklist Completed: patient identified, site marked, surgical consent, pre-op evaluation, timeout performed, IV checked, risks and benefits discussed and monitors and equipment checked  Epidural Patient position: sitting Prep: site prepped and draped and DuraPrep Patient monitoring: continuous pulse ox and blood pressure Approach: midline Location: L3-L4 Injection technique: LOR air  Needle:  Needle type: Tuohy  Needle gauge: 17 G Needle length: 9 cm and 9 Needle insertion depth: 5 cm cm Catheter type: closed end flexible Catheter size: 19 Gauge Catheter at skin depth: 10 cm Test dose: negative  Assessment Events: blood not aspirated, injection not painful, no injection resistance, negative IV test and no paresthesia  Additional Notes Patient identified.  Risk benefits discussed including failed block, incomplete pain control, headache, nerve damage, paralysis, blood pressure changes, nausea, vomiting, reactions to medication both toxic or allergic, and postpartum back pain.  Patient expressed understanding and wished to proceed.  All questions were answered.  Sterile technique used throughout procedure and epidural site dressed with sterile barrier dressing. No paresthesia or other complications noted.The patient did not experience any signs of intravascular injection such as tinnitus or metallic taste in mouth nor signs of intrathecal spread such as rapid motor block. Please see nursing notes for vital signs.

## 2014-02-01 NOTE — Anesthesia Preprocedure Evaluation (Signed)

## 2014-02-02 LAB — CBC
HCT: 31.2 % — ABNORMAL LOW (ref 36.0–46.0)
HEMOGLOBIN: 10.7 g/dL — AB (ref 12.0–15.0)
MCH: 30.8 pg (ref 26.0–34.0)
MCHC: 34.3 g/dL (ref 30.0–36.0)
MCV: 89.9 fL (ref 78.0–100.0)
Platelets: 296 10*3/uL (ref 150–400)
RBC: 3.47 MIL/uL — ABNORMAL LOW (ref 3.87–5.11)
RDW: 13.2 % (ref 11.5–15.5)
WBC: 11.2 10*3/uL — ABNORMAL HIGH (ref 4.0–10.5)

## 2014-02-02 NOTE — Lactation Note (Signed)
This note was copied from the chart of Erica Mariangel [place]. Lactation Consultation Note Follow up visit at 33 hours of age.  [facility] RN reports visitor are not supportive of mom breastfeeding.  Several visitors in room at time of visit.  Mom reports giving formula in bottle  1 1/2 hours ago because her breast were sore.  MGM reports 2nd formula was from same bottle as previous and about 25mls.  Discussed small stomach size and encouraged smaller supplementation feedings.  Explained to mom and visitors bottle is only good for one hour after opening due to risk of infection from bacteria.  Nipples are erect with pink centers, but skin is intact.  Mom has comfort gels but not in place.  Encouraged to use to help with discomfort.  Assisted with hand expression and mom is not tolerating due to pain.  Encouraged mom to call for assist with latch to help assess pain with latch.  Mom did not make eye contact with me during this visit and mostly had her whole body turned away.  That may be due to discomfort from delivery, but noted body language was not receptive. Mom to call for assist as needed.   Patient Name: Erica Derryl HarborKeisha Dier ZOXWR'UToday's Date: 02/02/2014 Reason for consult: Follow-up assessment;Difficult latch;Breast/nipple pain   Maternal Data    Feeding Feeding Type: Formula Length of feed: 5 min  LATCH Score/Interventions                      Lactation Tools Discussed/Used     Consult Status Consult Status: Follow-up Date: 02/03/14 Follow-up type: In-patient    Erica Clements, Erica Clements 02/02/2014, 9:10 PM

## 2014-02-02 NOTE — Anesthesia Postprocedure Evaluation (Signed)
Anesthesia Post Note  Patient: Erica Clements  Procedure(s) Performed: * No procedures listed *  Anesthesia type: Epidural  Patient location: Mother/Baby  Post pain: Pain level controlled  Post assessment: Post-op Vital signs reviewed  Last Vitals:  Filed Vitals:   02/02/14 0225  BP: 117/64  Pulse: 78  Temp: 36.7 C    Post vital signs: Reviewed  Level of consciousness:alert  Complications: No apparent anesthesia complications

## 2014-02-02 NOTE — Progress Notes (Signed)
Post Partum Day 1 Subjective: no complaints  Objective: Blood pressure 117/64, pulse 78, temperature 98 F (36.7 C), temperature source Oral, resp. rate 20, height 5\' 5"  (1.651 m), weight 152 lb (68.947 kg), last menstrual period 05/04/2013, SpO2 100.00%, unknown if currently breastfeeding.  Physical Exam:  General: alert Lochia: appropriate Uterine Fundus: firm Incision: healing well DVT Evaluation: No evidence of DVT seen on physical exam.   Recent Labs  02/01/14 0800 02/02/14 0547  HGB 12.6 10.7*  HCT 35.8* 31.2*    Assessment/Plan: Plan for discharge tomorrow   LOS: 1 day   Kanai Hilger M 02/02/2014, 8:16 AM

## 2014-02-03 MED ORDER — IBUPROFEN 800 MG PO TABS: 800.0000 mg | ORAL_TABLET | Freq: Three times a day (TID) | ORAL | Status: DC | PRN

## 2014-02-03 MED ORDER — OXYCODONE-ACETAMINOPHEN 5-325 MG PO TABS: 1.0000 | ORAL_TABLET | Freq: Four times a day (QID) | ORAL | Status: DC | PRN

## 2014-02-03 NOTE — Discharge Summary (Signed)
Obstetric Discharge Summary Reason for Admission: induction of labor Prenatal Procedures: none Intrapartum Procedures: spontaneous vaginal delivery Postpartum Procedures: none Complications-Operative and Postpartum: none Hemoglobin  Date Value Ref Range Status  02/02/2014 10.7* 12.0 - 15.0 g/dL Final     HCT  Date Value Ref Range Status  02/02/2014 31.2* 36.0 - 46.0 % Final    Physical Exam:  General: alert Lochia: appropriate Uterine Fundus: firm Incision: healing well DVT Evaluation: No evidence of DVT seen on physical exam.  Discharge Diagnoses: Term Pregnancy-delivered  Discharge Information: Date: 02/03/2014 Activity: pelvic rest Diet: routine Medications: PNV, Ibuprofen and Percocet Condition: stable Instructions: refer to practice specific booklet Discharge to: home Follow-up Information   [facility] Schedule an appointment as soon as possible for a visit in 6 weeks.   Contact information:   [address] [place] [id number] [phone]      Newborn Data: Live born female  Birth Weight: 9 lb (4082 g) APGAR: 9, 9  Home with mother.  Meriel PicaHOLLAND,Abdirahman Chittum M 02/03/2014, 6:54 AM

## 2014-05-03 ENCOUNTER — Emergency Department (HOSPITAL_COMMUNITY)
Admission: EM | Admit: 2014-05-03 | Discharge: 2014-05-03 | Disposition: A | Discharge status: Home / Self Care | Payer: Managed Care, Other (non HMO) | Attending: Emergency Medicine | Admitting: Emergency Medicine

## 2014-05-03 ENCOUNTER — Encounter (HOSPITAL_COMMUNITY): Payer: Self-pay | Admitting: Emergency Medicine

## 2014-05-03 DIAGNOSIS — Y9389 Activity, other specified: Secondary | ICD-10-CM | POA: Diagnosis not present

## 2014-05-03 DIAGNOSIS — S3992XA Unspecified injury of lower back, initial encounter: Secondary | ICD-10-CM | POA: Insufficient documentation

## 2014-05-03 DIAGNOSIS — M545 Low back pain, unspecified: Secondary | ICD-10-CM

## 2014-05-03 DIAGNOSIS — Z87891 Personal history of nicotine dependence: Secondary | ICD-10-CM | POA: Insufficient documentation

## 2014-05-03 DIAGNOSIS — Z79899 Other long term (current) drug therapy: Secondary | ICD-10-CM | POA: Insufficient documentation

## 2014-05-03 DIAGNOSIS — Y92410 Unspecified street and highway as the place of occurrence of the external cause: Secondary | ICD-10-CM | POA: Diagnosis not present
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
## 2014-05-03 NOTE — ED Provider Notes (Signed)
Medical screening examination/treatment/procedure(s) were performed by non-physician practitioner and as supervising physician I was immediately available for consultation/collaboration.   Toy CookeyMegan Docherty, MD 05/03/14 [phone]
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
## 2014-05-03 NOTE — Discharge Instructions (Signed)
Back Pain, Adult Low back pain is very common. About 1 in 5 people have back pain.The cause of low back pain is rarely dangerous. The pain often gets better over time.About half of people with a sudden onset of back pain feel better in just 2 weeks. About 8 in 10 people feel better by 6 weeks.  CAUSES Some common causes of back pain include:  Strain of the muscles or ligaments supporting the spine.  Wear and tear (degeneration) of the spinal discs.  Arthritis.  Direct injury to the back. DIAGNOSIS Most of the time, the direct cause of low back pain is not known.However, back pain can be treated effectively even when the exact cause of the pain is unknown.Answering your caregiver's questions about your overall health and symptoms is one of the most accurate ways to make sure the cause of your pain is not dangerous. If your caregiver needs more information, he or she may order lab work or imaging tests (X-rays or MRIs).However, even if imaging tests show changes in your back, this usually does not require surgery. HOME CARE INSTRUCTIONS For many people, back pain returns.Since low back pain is rarely dangerous, it is often a condition that people can learn to manageon their own.   Remain active. It is stressful on the back to sit or stand in one place. Do not sit, drive, or stand in one place for more than 30 minutes at a time. Take short walks on level surfaces as soon as pain allows.Try to increase the length of time you walk each day.  Do not stay in bed.Resting more than 1 or 2 days can delay your recovery.  Do not avoid exercise or work.Your body is made to move.It is not dangerous to be active, even though your back may hurt.Your back will likely heal faster if you return to being active before your pain is gone.  Pay attention to your body when you bend and lift. Many people have less discomfortwhen lifting if they bend their knees, keep the load close to their bodies,and  avoid twisting. Often, the most comfortable positions are those that put less stress on your recovering back.  Find a comfortable position to sleep. Use a firm mattress and lie on your side with your knees slightly bent. If you lie on your back, put a pillow under your knees.  Only take over-the-counter or prescription medicines as directed by your caregiver. Over-the-counter medicines to reduce pain and inflammation are often the most helpful.Your caregiver may prescribe muscle relaxant drugs.These medicines help dull your pain so you can more quickly return to your normal activities and healthy exercise.  Put ice on the injured area.  Put ice in a plastic bag.  Place a towel between your skin and the bag.  Leave the ice on for 15-20 minutes, 03-04 times a day for the first 2 to 3 days. After that, ice and heat may be alternated to reduce pain and spasms.  Ask your caregiver about trying back exercises and gentle massage. This may be of some benefit.  Avoid feeling anxious or stressed.Stress increases muscle tension and can worsen back pain.It is important to recognize when you are anxious or stressed and learn ways to manage it.Exercise is a great option. SEEK MEDICAL CARE IF:  You have pain that is not relieved with rest or medicine.  You have pain that does not improve in 1 week.  You have new symptoms.  You are generally not feeling well. SEEK   IMMEDIATE MEDICAL CARE IF:   You have pain that radiates from your back into your legs.  You develop new bowel or bladder control problems.  You have unusual weakness or numbness in your arms or legs.  You develop nausea or vomiting.  You develop abdominal pain.  You feel faint. Document Released: 06/21/2005 Document Revised: 12/21/2011 Document Reviewed: 10/23/2013 [employer] Patient Information 2015 [employer]. This information is not intended to replace advice given to you by your health care provider. Make sure you  discuss any questions you have with your health care provider.  

## 2014-05-06 ENCOUNTER — Encounter (HOSPITAL_COMMUNITY): Payer: Self-pay | Admitting: Emergency Medicine

## 2015-02-03 ENCOUNTER — Emergency Department (HOSPITAL_COMMUNITY)
Admission: EM | Admit: 2015-02-03 | Discharge: 2015-02-03 | Disposition: A | Discharge status: Home / Self Care | Payer: Managed Care, Other (non HMO) | Attending: Emergency Medicine | Admitting: Emergency Medicine

## 2015-02-03 ENCOUNTER — Encounter (HOSPITAL_COMMUNITY): Payer: Self-pay | Admitting: Emergency Medicine

## 2015-02-03 DIAGNOSIS — N730 Acute parametritis and pelvic cellulitis: Secondary | ICD-10-CM

## 2015-02-03 DIAGNOSIS — N939 Abnormal uterine and vaginal bleeding, unspecified: Secondary | ICD-10-CM | POA: Diagnosis present

## 2015-02-03 DIAGNOSIS — Z87891 Personal history of nicotine dependence: Secondary | ICD-10-CM | POA: Insufficient documentation

## 2015-02-03 DIAGNOSIS — Z3202 Encounter for pregnancy test, result negative: Secondary | ICD-10-CM | POA: Insufficient documentation

## 2015-02-03 LAB — WET PREP, GENITAL
Clue Cells Wet Prep HPF POC: NONE SEEN
TRICH WET PREP: NONE SEEN
Yeast Wet Prep HPF POC: NONE SEEN

## 2015-02-03 LAB — URINALYSIS, ROUTINE W REFLEX MICROSCOPIC
Bilirubin Urine: NEGATIVE
Glucose, UA: NEGATIVE mg/dL
HGB URINE DIPSTICK: NEGATIVE
Ketones, ur: 15 mg/dL — AB
NITRITE: NEGATIVE
PROTEIN: NEGATIVE mg/dL
SPECIFIC GRAVITY, URINE: 1.025 (ref 1.005–1.030)
Urobilinogen, UA: 1 mg/dL (ref 0.0–1.0)
pH: 7 (ref 5.0–8.0)

## 2015-02-03 LAB — BASIC METABOLIC PANEL
Anion gap: 6 (ref 5–15)
BUN: 8 mg/dL (ref 6–20)
CALCIUM: 8.7 mg/dL — AB (ref 8.9–10.3)
CO2: 25 mmol/L (ref 22–32)
Chloride: 106 mmol/L (ref 101–111)
Creatinine, Ser: 0.67 mg/dL (ref 0.44–1.00)
GFR calc non Af Amer: >60 mL/min (ref >60)
GLUCOSE: 93 mg/dL (ref 65–99)
Potassium: 3.3 mmol/L — ABNORMAL LOW (ref 3.5–5.1)
Sodium: 137 mmol/L (ref 135–145)

## 2015-02-03 LAB — URINE MICROSCOPIC-ADD ON

## 2015-02-03 LAB — CBC WITH DIFFERENTIAL/PLATELET
Basophils Absolute: 0 10*3/uL (ref 0.0–0.1)
Basophils Relative: 0 % (ref 0–1)
Eosinophils Absolute: 0.1 10*3/uL (ref 0.0–0.7)
Eosinophils Relative: 1 % (ref 0–5)
HCT: 36.9 % (ref 36.0–46.0)
Hemoglobin: 12.5 g/dL (ref 12.0–15.0)
Lymphocytes Relative: 10 % — ABNORMAL LOW (ref 12–46)
Lymphs Abs: 1.3 10*3/uL (ref 0.7–4.0)
MCH: 30.3 pg (ref 26.0–34.0)
MCHC: 33.9 g/dL (ref 30.0–36.0)
MCV: 89.6 fL (ref 78.0–100.0)
Monocytes Absolute: 0.8 10*3/uL (ref 0.1–1.0)
Monocytes Relative: 7 % (ref 3–12)
Neutro Abs: 10.6 10*3/uL — ABNORMAL HIGH (ref 1.7–7.7)
Neutrophils Relative %: 82 % — ABNORMAL HIGH (ref 43–77)
Platelets: 296 10*3/uL (ref 150–400)
RBC: 4.12 MIL/uL (ref 3.87–5.11)
RDW: 12 % (ref 11.5–15.5)
WBC: 12.8 10*3/uL — AB (ref 4.0–10.5)

## 2015-02-03 LAB — POC URINE PREG, ED: Preg Test, Ur: NEGATIVE

## 2015-02-03 MED ORDER — LIDOCAINE HCL 1 % IJ SOLN
INTRAMUSCULAR | Status: AC
  Administered 2015-02-03: 1 mL
  Filled 2015-02-03: qty 20

## 2015-02-03 MED ORDER — FENTANYL CITRATE (PF) 100 MCG/2ML IJ SOLN
25.0000 ug | Freq: Once | INTRAMUSCULAR | Status: AC
  Administered 2015-02-03: 25 ug via INTRAVENOUS
  Filled 2015-02-03: qty 2

## 2015-02-03 MED ORDER — ONDANSETRON HCL 4 MG/2ML IJ SOLN
4.0000 mg | Freq: Once | INTRAMUSCULAR | Status: AC
  Administered 2015-02-03: 4 mg via INTRAVENOUS
  Filled 2015-02-03: qty 2

## 2015-02-03 MED ORDER — CEFTRIAXONE SODIUM 250 MG IJ SOLR
250.0000 mg | Freq: Once | INTRAMUSCULAR | Status: AC
  Administered 2015-02-03: 250 mg via INTRAMUSCULAR
  Filled 2015-02-03: qty 250

## 2015-02-03 MED ORDER — POTASSIUM CHLORIDE CRYS ER 20 MEQ PO TBCR
20.0000 meq | EXTENDED_RELEASE_TABLET | Freq: Once | ORAL | Status: AC
  Administered 2015-02-03: 20 meq via ORAL
  Filled 2015-02-03: qty 1

## 2015-02-03 MED ORDER — SODIUM CHLORIDE 0.9 % IV BOLUS (SEPSIS)
1000.0000 mL | Freq: Once | INTRAVENOUS | Status: AC
  Administered 2015-02-03: 1000 mL via INTRAVENOUS

## 2015-02-03 MED ORDER — DOXYCYCLINE HYCLATE 100 MG PO CAPS: 100.0000 mg | ORAL_CAPSULE | Freq: Two times a day (BID) | ORAL | Status: DC

## 2015-02-03 NOTE — ED Notes (Signed)
Pt reports improvement in nausea.

## 2015-02-03 NOTE — Discharge Instructions (Signed)
Pelvic Inflammatory Disease Follow up with women's outpatient clinic. Take doxycycline as prescribed. Return for worsening abdominal pain, vomiting. Pelvic inflammatory disease (PID) refers to an infection in some or all of the female organs. The infection can be in the uterus, ovaries, fallopian tubes, or the surrounding tissues in the pelvis. PID can cause abdominal or pelvic pain that comes on suddenly (acute pelvic pain). PID is a serious infection because it can lead to lasting (chronic) pelvic pain or the inability to have children (infertile).  CAUSES  The infection is often caused by the normal bacteria found in the vaginal tissues. PID may also be caused by an infection that is spread during sexual contact. PID can also occur following:   The birth of a baby.   A miscarriage.   An abortion.   Major pelvic surgery.   The use of an intrauterine device (IUD).   A sexual assault.  RISK FACTORS Certain factors can put a person at higher risk for PID, such as:  Being younger than 25 years.  Being sexually active at [place] age.  Usingnonbarrier contraception.  Havingmultiple sexual partners.  Having sex with someone who has symptoms of a genital infection.  Using oral contraception. Other times, certain behaviors can increase the possibility of getting PID, such as:  Having sex during your period.  Using a vaginal douche.  Having an intrauterine device (IUD) in place. SYMPTOMS   Abdominal or pelvic pain.   Fever.   Chills.   Abnormal vaginal discharge.  Abnormal uterine bleeding.   Unusual pain shortly after finishing your period. DIAGNOSIS  Your caregiver will choose some of the following methods to make a diagnosis, such as:   Performinga physical exam and history. A pelvic exam typically reveals a very tender uterus and surrounding pelvis.   Ordering laboratory tests including a pregnancy test, blood tests, and urine  test.  Orderingcultures of the vagina and cervix to check for a sexually transmitted infection (STI).  Performing an ultrasound.   Performing a laparoscopic procedure to look inside the pelvis.  TREATMENT   Antibiotic medicines may be prescribed and taken by mouth.   Sexual partners may be treated when the infection is caused by a sexually transmitted disease (STD).   Hospitalization may be needed to give antibiotics intravenously.  Surgery may be needed, but this is rare. It may take weeks until you are completely well. If you are diagnosed with PID, you should also be checked for human immunodeficiency virus (HIV). HOME CARE INSTRUCTIONS   If given, take your antibiotics as directed. Finish the medicine even if you start to feel better.   Only take over-the-counter or prescription medicines for pain, discomfort, or fever as directed by your caregiver.   Do not have sexual intercourse until treatment is completed or as directed by your caregiver. If PID is confirmed, your recent sexual partner(s) will need treatment.   Keep your follow-up appointments. SEEK MEDICAL CARE IF:   You have increased or abnormal vaginal discharge.   You need prescription medicine for your pain.   You vomit.   You cannot take your medicines.   Your partner has an STD.  SEEK IMMEDIATE MEDICAL CARE IF:   You have a fever.   You have increased abdominal or pelvic pain.   You have chills.   You have pain when you urinate.   You are not better after 72 hours following treatment.  MAKE SURE YOU:   Understand these instructions.  Will watch your  condition.  Will get help right away if you are not doing well or get worse. Document Released: 06/21/2005 Document Revised: 10/16/2012 Document Reviewed: 06/17/2011 [facility] Patient Information 2015 [place], [place]. This information is not intended to replace advice given to you by your health care provider. Make sure you  discuss any questions you have with your health care provider.

## 2015-02-03 NOTE — ED Notes (Signed)
Pt complaint of abdominal pain and nausea onset this morning; pt continues to reports vaginal bleeding onset Wednesday post Implanon taken out on Tuesday.

## 2015-02-03 NOTE — ED Provider Notes (Signed)
CSN: [id number]     Arrival date & time 02/03/15  1305 History   First MD Initiated Contact with Patient 02/03/15 1322     Chief Complaint  Patient presents with  . Abdominal Pain  . Vaginal Bleeding     (Consider location/radiation/quality/duration/timing/severity/associated sxs/prior Treatment) Patient is a 24 y.o. female presenting with abdominal pain and vaginal bleeding. The history is provided by the patient. No language interpreter was used.  Abdominal Pain Associated symptoms: vaginal bleeding   Associated symptoms: no fever   Vaginal Bleeding Associated symptoms: abdominal pain   Associated symptoms: no fever   Erica Clements is a 24 y.o female who presents for constant and worsening abdominal pain and nausea that began that began this morning. She states her last menstrual period was 15 days ago. She had her Implanon removed 6 days ago. She reports that she has had vaginal bleeding since the day after it was removed. She states she is sexually active. She denies being pregnant. She denies any history of STDs. She does report having bacterial vaginosis previously. She denies any fever, chills, shortness of breath, chest pain, vomiting, diarrhea, constipation, dysuria, hematuria, urinary frequency, vaginal odor or discharge.  Past Medical History  Diagnosis Date  . Medical history non-contributory    Past Surgical History  Procedure Laterality Date  . No past surgeries     Family History  Problem Relation Age of Onset  . Cancer Paternal Grandmother   . Diabetes Paternal Grandmother    History  Substance Use Topics  . Smoking status: Former Games developer  . Smokeless tobacco: Never Used  . Alcohol Use: Yes     Comment: Socially   OB History    Gravida Para Term Preterm AB TAB SAB Ectopic Multiple Living   Review of Systems  Constitutional: Negative for fever.  Gastrointestinal: Positive for abdominal pain.  Genitourinary: Positive for vaginal bleeding.    All other systems reviewed and are negative.     Allergies  Review of patient's allergies indicates no known allergies.  Home Medications   Prior to Admission medications   Medication Sig Start Date End Date Taking? Authorizing Provider  doxycycline (VIBRAMYCIN) 100 MG capsule Take 1 capsule (100 mg total) by mouth 2 (two) times daily. 02/03/15   Orange Hilligoss Patel-Mills, PA-C  ibuprofen (ADVIL,MOTRIN) 800 MG tablet Take 1 tablet (800 mg total) by mouth every 8 (eight) hours as needed for moderate pain. Patient not taking: Reported on 02/03/2015 02/03/14   Richarda Overlie, MD  oxyCODONE-acetaminophen (PERCOCET/ROXICET) 5-325 MG per tablet Take 1-2 tablets by mouth every 6 (six) hours as needed for moderate pain. Patient not taking: Reported on 02/03/2015 02/03/14   Richarda Overlie, MD   BP 95/58 mmHg  Pulse 93  Temp(Src) 98.9 F (37.2 C) (Oral)  Resp 20  SpO2 100%  LMP 01/29/2015 Physical Exam  Constitutional: She is oriented to person, place, and time. She appears well-developed and well-nourished.  HENT:  Head: Normocephalic and atraumatic.  Eyes: Conjunctivae are normal.  Neck: Normal range of motion. Neck supple.  Cardiovascular: Normal rate, regular rhythm and normal heart sounds.   Pulmonary/Chest: Effort normal and breath sounds normal. No respiratory distress. She has no wheezes.  Abdominal: Soft. Normal appearance. She exhibits no distension and no mass. There is tenderness in the suprapubic area. There is guarding. There is no rigidity and no rebound.    Genitourinary: Pelvic exam was performed with patient  supine. No erythema or bleeding in the vagina. No foreign body around the vagina. Vaginal discharge found.   Pelvic exam: Chaperone present. Copious amounts of green vaginal malodorous discharge. No vaginal bleeding. Left adnexal tenderness but no rebound. Cervical os closed.  Musculoskeletal: Normal range of motion.  Neurological: She is alert and oriented to person, place, and  time.  Skin: Skin is warm and dry.  Nursing note and vitals reviewed.   ED Course  Procedures (including critical care time) Labs Review Labs Reviewed  WET PREP, GENITAL - Abnormal; Notable for the following:    WBC, Wet Prep HPF POC MANY (*)    All other components within normal limits  CBC WITH DIFFERENTIAL/PLATELET - Abnormal; Notable for the following:    WBC 12.8 (*)    Neutrophils Relative % 82 (*)    Neutro Abs 10.6 (*)    Lymphocytes Relative 10 (*)    All other components within normal limits  BASIC METABOLIC PANEL - Abnormal; Notable for the following:    Potassium 3.3 (*)    Calcium 8.7 (*)    All other components within normal limits  URINALYSIS, ROUTINE W REFLEX MICROSCOPIC (NOT AT [facility]) - Abnormal; Notable for the following:    Ketones, ur 15 (*)    Leukocytes, UA TRACE (*)    All other components within normal limits  URINE MICROSCOPIC-ADD ON  POC URINE PREG, ED  GC/CHLAMYDIA PROBE AMP ([place]) NOT AT [facility]    Imaging Review No results found.   EKG Interpretation None      MDM   Final diagnoses:  PID (acute pelvic inflammatory disease)   Patient presents for abdominal pain and nausea. I believe the patient has PID. He is mildly hypokalemic but otherwise her labs are not concerning. She does not have a UTI. Medications  sodium chloride 0.9 % bolus 1,000 mL (0 mLs Intravenous Stopped 02/03/15 1445)  fentaNYL (SUBLIMAZE) injection 25 mcg (25 mcg Intravenous Given 02/03/15 1351)  ondansetron (ZOFRAN) injection 4 mg (4 mg Intravenous Given 02/03/15 1352)  potassium chloride SA (K-DUR,KLOR-CON) CR tablet 20 mEq (20 mEq Oral Given 02/03/15 1459)  cefTRIAXone (ROCEPHIN) injection 250 mg (250 mg Intramuscular Given 02/03/15 1602)  lidocaine (XYLOCAINE) 1 % (with pres) injection (1 mL  Given 02/03/15 1601)  I gave her prescription for doxycycline to go home with. I also discussed return precautions and gave her women's outpatient clinic for follow-up.  I also  discussed that GC chlamydia results would take a few days and that the hospital would call her if they were positive and treated at that time.Patient verbally agrees with the plan.    Catha Gosselin, PA-C 02/03/15 1623  Pricilla Loveless, MD 02/04/15 [phone]

## 2015-02-04 ENCOUNTER — Emergency Department (HOSPITAL_COMMUNITY)
Admission: EM | Admit: 2015-02-04 | Discharge: 2015-02-04 | Disposition: A | Discharge status: Home / Self Care | Payer: Managed Care, Other (non HMO) | Attending: Emergency Medicine | Admitting: Emergency Medicine

## 2015-02-04 ENCOUNTER — Emergency Department (HOSPITAL_COMMUNITY): Payer: Managed Care, Other (non HMO)

## 2015-02-04 DIAGNOSIS — R102 Pelvic and perineal pain: Secondary | ICD-10-CM | POA: Diagnosis not present

## 2015-02-04 DIAGNOSIS — Z792 Long term (current) use of antibiotics: Secondary | ICD-10-CM | POA: Diagnosis not present

## 2015-02-04 DIAGNOSIS — Z87891 Personal history of nicotine dependence: Secondary | ICD-10-CM | POA: Diagnosis not present

## 2015-02-04 DIAGNOSIS — R112 Nausea with vomiting, unspecified: Secondary | ICD-10-CM | POA: Diagnosis not present

## 2015-02-04 DIAGNOSIS — R531 Weakness: Secondary | ICD-10-CM | POA: Diagnosis present

## 2015-02-04 DIAGNOSIS — R55 Syncope and collapse: Secondary | ICD-10-CM | POA: Diagnosis not present

## 2015-02-04 LAB — I-STAT CHEM 8, ED
BUN: 6 mg/dL (ref 6–20)
CHLORIDE: 103 mmol/L (ref 101–111)
CREATININE: 0.6 mg/dL (ref 0.44–1.00)
Calcium, Ion: 1.15 mmol/L (ref 1.12–1.23)
GLUCOSE: 76 mg/dL (ref 65–99)
HCT: 38 % (ref 36.0–46.0)
Hemoglobin: 12.9 g/dL (ref 12.0–15.0)
Potassium: 4 mmol/L (ref 3.5–5.1)
SODIUM: 139 mmol/L (ref 135–145)
TCO2: 23 mmol/L (ref 0–100)

## 2015-02-04 LAB — GC/CHLAMYDIA PROBE AMP (~~LOC~~) NOT AT ARMC
CHLAMYDIA, DNA PROBE: NEGATIVE
NEISSERIA GONORRHEA: NEGATIVE

## 2015-02-04 MED ORDER — SODIUM CHLORIDE 0.9 % IV BOLUS (SEPSIS)
1000.0000 mL | Freq: Once | INTRAVENOUS | Status: AC
Start: 2015-02-04 — End: 2015-02-04
  Administered 2015-02-04: 1000 mL via INTRAVENOUS

## 2015-02-04 MED ORDER — ONDANSETRON HCL 4 MG/2ML IJ SOLN
4.0000 mg | Freq: Once | INTRAMUSCULAR | Status: AC
  Administered 2015-02-04: 4 mg via INTRAVENOUS
  Filled 2015-02-04: qty 2

## 2015-02-04 NOTE — ED Notes (Signed)
Pt was at work and was found in the utility room on the floor by another staff member.  Pt told EMS she went in there to lay down d/t weakness and not feeling well.  Was seen here yesterday and put on doxycycline.  Told ems she took last nights and todays dose on an empty stomach.  EMS VSS: 112/70, 74, 16, 100RA, CBG 77.

## 2015-02-04 NOTE — Discharge Instructions (Signed)
Pelvic Pain Female pelvic pain can be caused by many different things and start from a variety of places. Pelvic pain refers to pain that is located in the lower half of the abdomen and between your hips. The pain may occur over a short period of time (acute) or may be reoccurring (chronic). The cause of pelvic pain may be related to disorders affecting the female reproductive organs (gynecologic), but it may also be related to the bladder, kidney stones, an intestinal complication, or muscle or skeletal problems. Getting help right away for pelvic pain is important, especially if there has been severe, sharp, or a sudden onset of unusual pain. It is also important to get help right away because some types of pelvic pain can be life threatening.  CAUSES  Below are only some of the causes of pelvic pain. The causes of pelvic pain can be in one of several categories.   Gynecologic.  Pelvic inflammatory disease.  Sexually transmitted infection.  Ovarian cyst or a twisted ovarian ligament (ovarian torsion).  Uterine lining that grows outside the uterus (endometriosis).  Fibroids, cysts, or tumors.  Ovulation.  Pregnancy.  Pregnancy that occurs outside the uterus (ectopic pregnancy).  Miscarriage.  Labor.  Abruption of the placenta or ruptured uterus.  Infection.  Uterine infection (endometritis).  Bladder infection.  Diverticulitis.  Miscarriage related to a uterine infection (septic abortion).  Bladder.  Inflammation of the bladder (cystitis).  Kidney stone(s).  Gastrointestinal.  Constipation.  Diverticulitis.  Neurologic.  Trauma.  Feeling pelvic pain because of mental or emotional causes (psychosomatic).  Cancers of the bowel or pelvis. EVALUATION  Your caregiver will want to take a careful history of your concerns. This includes recent changes in your health, a careful gynecologic history of your periods (menses), and a sexual history. Obtaining your family  history and medical history is also important. Your caregiver may suggest a pelvic exam. A pelvic exam will help identify the location and severity of the pain. It also helps in the evaluation of which organ system may be involved. In order to identify the cause of the pelvic pain and be properly treated, your caregiver may order tests. These tests may include:   A pregnancy test.  Pelvic ultrasonography.  An X-ray exam of the abdomen.  A urinalysis or evaluation of vaginal discharge.  Blood tests. HOME CARE INSTRUCTIONS   Only take over-the-counter or prescription medicines for pain, discomfort, or fever as directed by your caregiver.   Rest as directed by your caregiver.   Eat a balanced diet.   Drink enough fluids to make your urine clear or pale yellow, or as directed.   Avoid sexual intercourse if it causes pain.   Apply warm or cold compresses to the lower abdomen depending on which one helps the pain.   Avoid stressful situations.   Keep a journal of your pelvic pain. Write down when it started, where the pain is located, and if there are things that seem to be associated with the pain, such as food or your menstrual cycle.  Follow up with your caregiver as directed.  SEEK MEDICAL CARE IF:  Your medicine does not help your pain.  You have abnormal vaginal discharge. SEEK IMMEDIATE MEDICAL CARE IF:   You have heavy bleeding from the vagina.   Your pelvic pain increases.   You feel light-headed or faint.   You have chills.   You have pain with urination or blood in your urine.   You have uncontrolled diarrhea   or vomiting.   You have a fever or persistent symptoms for more than 3 days.  You have a fever and your symptoms suddenly get worse.   You are being physically or sexually abused.  MAKE SURE YOU:  Understand these instructions.  Will watch your condition.  Will get help if you are not doing well or get worse. Document Released:  05/18/2004 Document Revised: 11/05/2013 Document Reviewed: 10/11/2011 [employer] Patient Information 2015 [employer]. This information is not intended to replace advice given to you by your health care provider. Make sure you discuss any questions you have with your health care provider.  

## 2015-02-04 NOTE — ED Notes (Signed)
Patient walked to restroom

## 2015-02-04 NOTE — ED Provider Notes (Signed)
CSN: [id number]     Arrival date & time 02/04/15  1914 History   First MD Initiated Contact with Patient 02/04/15 1020     Chief Complaint  Patient presents with  . Weakness     (Consider location/radiation/quality/duration/timing/severity/associated sxs/prior Treatment) HPI Comments: Patient presents with weakness. She states that she was at work and was feeling very weak and had an episode of nausea and vomiting. She had a brief syncopal episode. This happened after she had already laid down on the floor. She did not fall. She was seen here yesterday with lower abdominal pain and green vaginal discharge. She was diagnosed with PID. She was given a shot of Rocephin and started on doxycycline. She took a dose of doxycycline this morning on an empty stomach and she feels like that is what made her feel sick. She does say that her abdominal pain is continuing. She denies any fevers. She still little bit nauseated.  Patient is a 24 y.o. female presenting with weakness.  Weakness Associated symptoms include abdominal pain. Pertinent negatives include no chest pain, no headaches and no shortness of breath.    Past Medical History  Diagnosis Date  . Medical history non-contributory    Past Surgical History  Procedure Laterality Date  . No past surgeries     Family History  Problem Relation Age of Onset  . Cancer Paternal Grandmother   . Diabetes Paternal Grandmother    History  Substance Use Topics  . Smoking status: Former Games developer  . Smokeless tobacco: Never Used  . Alcohol Use: Yes     Comment: Socially   OB History    Gravida Para Term Preterm AB TAB SAB Ectopic Multiple Living   Review of Systems  Constitutional: Positive for fatigue. Negative for fever, chills and diaphoresis.  HENT: Negative for congestion, rhinorrhea and sneezing.   Eyes: Negative.   Respiratory: Negative for cough, chest tightness and shortness of breath.   Cardiovascular: Negative for  chest pain and leg swelling.  Gastrointestinal: Positive for nausea, vomiting and abdominal pain. Negative for diarrhea and blood in stool.  Genitourinary: Positive for vaginal discharge. Negative for frequency, hematuria, flank pain, vaginal bleeding and difficulty urinating.  Musculoskeletal: Negative for back pain and arthralgias.  Skin: Negative for rash.  Neurological: Positive for syncope and weakness (generalized). Negative for dizziness, speech difficulty, numbness and headaches.      Allergies  Review of patient's allergies indicates no known allergies.  Home Medications   Prior to Admission medications   Medication Sig Start Date End Date Taking? Authorizing Provider  doxycycline (VIBRAMYCIN) 100 MG capsule Take 1 capsule (100 mg total) by mouth 2 (two) times daily. 02/03/15  Yes Hanna Patel-Mills, PA-C  ibuprofen (ADVIL,MOTRIN) 800 MG tablet Take 1 tablet (800 mg total) by mouth every 8 (eight) hours as needed for moderate pain. Patient not taking: Reported on 02/03/2015 02/03/14   Richarda Overlie, MD  oxyCODONE-acetaminophen (PERCOCET/ROXICET) 5-325 MG per tablet Take 1-2 tablets by mouth every 6 (six) hours as needed for moderate pain. Patient not taking: Reported on 02/03/2015 02/03/14   Richarda Overlie, MD   BP 92/49 mmHg  Pulse 69  Resp 16  Ht  (1.651 m)  Wt 123 lb (55.792 kg)  BMI 20.47 kg/m2  SpO2 100%  LMP 01/29/2015 Physical Exam  Constitutional: She is oriented to person, place, and time. She appears well-developed and well-nourished.  HENT:  Head:  Normocephalic and atraumatic.  Eyes: Pupils are equal, round, and reactive to light.  Neck: Normal range of motion. Neck supple.  Cardiovascular: Normal rate, regular rhythm and normal heart sounds.   Pulmonary/Chest: Effort normal and breath sounds normal. No respiratory distress. She has no wheezes. She has no rales. She exhibits no tenderness.  Abdominal: Soft. Bowel sounds are normal. There is tenderness (Moderate  tenderness to the left lower pelvic area). There is no rebound and no guarding.  Musculoskeletal: Normal range of motion. She exhibits no edema.  Lymphadenopathy:    She has no cervical adenopathy.  Neurological: She is alert and oriented to person, place, and time.  Skin: Skin is warm and dry. No rash noted.  Psychiatric: She has a normal mood and affect.    ED Course  Procedures (including critical care time) Labs Review Labs Reviewed  I-STAT CHEM 8, ED    Imaging Review US Transvaginal Non-ob  02/04/2015   CLINICAL DATA:  Left lower quadrant abdominal pain.  EXAM: TRANSABDOMINAL AND TRANSVAGINAL ULTRASOUND OF PELVIS  TECHNIQUE: Both transabdominal and transvaginal ultrasound examinations of the pelvis were performed. Transabdominal technique was performed for global imaging of the pelvis including uterus, ovaries, adnexal regions, and pelvic cul-de-sac. It was necessary to proceed with endovaginal exam following the transabdominal exam to visualize the endometrium.  COMPARISON:  None  FINDINGS: Uterus  Measurements: 8.4 x 4.1 x 5.2 cm, within normal limits. No fibroids or other mass visualized.  Endometrium  Thickness: 2.8 mm, within normal limits. No focal abnormality visualized.  Right ovary  Measurements: 3.6 x 1.7 x 1.9 cm, within normal limits. Normal appearance/no adnexal mass.  Left ovary  Measurements: 3.9 x 2.4 x 1.9 cm, within normal limits. Normal appearance/no adnexal mass.  Other findings  No free fluid.  IMPRESSION: Negative pelvic ultrasound. No acute focal lesion to explain the patient's left lower quadrant abdominal pain.   Electronically Signed   By: Marin Roberts M.D.   On: 02/04/2015 12:10   US Pelvis Complete  02/04/2015   CLINICAL DATA:  Left lower quadrant abdominal pain.  EXAM: TRANSABDOMINAL AND TRANSVAGINAL ULTRASOUND OF PELVIS  TECHNIQUE: Both transabdominal and transvaginal ultrasound examinations of the pelvis were performed. Transabdominal technique was  performed for global imaging of the pelvis including uterus, ovaries, adnexal regions, and pelvic cul-de-sac. It was necessary to proceed with endovaginal exam following the transabdominal exam to visualize the endometrium.  COMPARISON:  None  FINDINGS: Uterus  Measurements: 8.4 x 4.1 x 5.2 cm, within normal limits. No fibroids or other mass visualized.  Endometrium  Thickness: 2.8 mm, within normal limits. No focal abnormality visualized.  Right ovary  Measurements: 3.6 x 1.7 x 1.9 cm, within normal limits. Normal appearance/no adnexal mass.  Left ovary  Measurements: 3.9 x 2.4 x 1.9 cm, within normal limits. Normal appearance/no adnexal mass.  Other findings  No free fluid.  IMPRESSION: Negative pelvic ultrasound. No acute focal lesion to explain the patient's left lower quadrant abdominal pain.   Electronically Signed   By: Marin Roberts M.D.   On: 02/04/2015 12:10     EKG Interpretation None      MDM   Final diagnoses:  Pelvic pain in female  Near syncope    Patient feels much better after IV fluids. She's tolerating by mouth fluids and is ambulating without dizziness. Given her ongoing abdominal pain I did do a pelvic ultrasound which did not show any evidence of an abscess or ovarian mass. She was advised to  continue her treatment with doxycycline for presumed PID. She was encouraged to follow-up with the women's outpatient clinic if her symptoms are not improving.    Rolan Bucco, MD 02/04/15 1335

## 2015-02-04 NOTE — Progress Notes (Signed)
  [facility] ED CM spoke with pt on how to obtain an in network pcp with insurance coverage via the customer service number or web site  Cm reviewed ED level of care for crisis/emergent services and community pcp level of care to manage continuous or chronic medical concerns.  The pt voiced understanding CM encouraged pt and discussed pt's responsibility to verify with pt's insurance carrier that any recommended medical provider offered by any emergency room or a hospital provider is within the carrier's network. The pt voiced understanding   

## 2015-02-04 NOTE — ED Notes (Signed)
Bed: WA06 Expected date:  Expected time:  Means of arrival:  Comments: Abdominal pain 37f

## 2015-02-04 NOTE — Care Management Note (Deleted)
Case Management Note  Patient Details  Name: Erica Clements MRN: [id number] Date of Birth: 1991-01-29  Subjective/Objective:                    Action/Plan:   Expected Discharge Date:                  Expected Discharge Plan:     In-House Referral:     Discharge planning Services     Post Acute Care Choice:    Choice offered to:     DME Arranged:    DME Agency:     HH Arranged:    HH Agency:     Status of Service:     Medicare Important Message Given:    Date Medicare IM Given:    Medicare IM give by:    Date Additional Medicare IM Given:    Additional Medicare Important Message give by:     If discussed at Long Length of Stay Meetings, dates discussed:    Additional Comments:  Ophelia Shoulder, RN 02/04/2015, 1:07 PM

## 2015-02-04 NOTE — ED Notes (Signed)
Patient tolerated water

## 2015-03-29 ENCOUNTER — Encounter (HOSPITAL_COMMUNITY): Payer: Self-pay | Admitting: *Deleted

## 2015-03-29 ENCOUNTER — Inpatient Hospital Stay (HOSPITAL_COMMUNITY)
Admission: AD | Admit: 2015-03-29 | Discharge: 2015-03-30 | Disposition: A | Discharge status: Home / Self Care | Payer: Managed Care, Other (non HMO) | Source: Ambulatory Visit | Attending: Obstetrics and Gynecology | Admitting: Obstetrics and Gynecology

## 2015-03-29 ENCOUNTER — Inpatient Hospital Stay (HOSPITAL_COMMUNITY): Payer: Managed Care, Other (non HMO)

## 2015-03-29 DIAGNOSIS — O26899 Other specified pregnancy related conditions, unspecified trimester: Secondary | ICD-10-CM

## 2015-03-29 DIAGNOSIS — O26891 Other specified pregnancy related conditions, first trimester: Secondary | ICD-10-CM | POA: Diagnosis not present

## 2015-03-29 DIAGNOSIS — Z3A01 Less than 8 weeks gestation of pregnancy: Secondary | ICD-10-CM | POA: Diagnosis not present

## 2015-03-29 DIAGNOSIS — R109 Unspecified abdominal pain: Secondary | ICD-10-CM | POA: Insufficient documentation

## 2015-03-29 DIAGNOSIS — O9989 Other specified diseases and conditions complicating pregnancy, childbirth and the puerperium: Secondary | ICD-10-CM | POA: Diagnosis not present

## 2015-03-29 DIAGNOSIS — Z87891 Personal history of nicotine dependence: Secondary | ICD-10-CM | POA: Insufficient documentation

## 2015-03-29 DIAGNOSIS — Z3491 Encounter for supervision of normal pregnancy, unspecified, first trimester: Secondary | ICD-10-CM

## 2015-03-29 LAB — WET PREP, GENITAL
Trich, Wet Prep: NONE SEEN
YEAST WET PREP: NONE SEEN

## 2015-03-29 LAB — URINALYSIS, ROUTINE W REFLEX MICROSCOPIC
BILIRUBIN URINE: NEGATIVE
Glucose, UA: NEGATIVE mg/dL
HGB URINE DIPSTICK: NEGATIVE
Ketones, ur: 15 mg/dL — AB
Leukocytes, UA: NEGATIVE
Nitrite: NEGATIVE
Protein, ur: NEGATIVE mg/dL
Specific Gravity, Urine: 1.02 (ref 1.005–1.030)
UROBILINOGEN UA: 0.2 mg/dL (ref 0.0–1.0)
pH: 7 (ref 5.0–8.0)

## 2015-03-29 LAB — CBC
HCT: 33.3 % — ABNORMAL LOW (ref 36.0–46.0)
Hemoglobin: 11.5 g/dL — ABNORMAL LOW (ref 12.0–15.0)
MCH: 30.6 pg (ref 26.0–34.0)
MCHC: 34.5 g/dL (ref 30.0–36.0)
MCV: 88.6 fL (ref 78.0–100.0)
Platelets: 305 10*3/uL (ref 150–400)
RBC: 3.76 MIL/uL — ABNORMAL LOW (ref 3.87–5.11)
RDW: 12.5 % (ref 11.5–15.5)
WBC: 5.8 10*3/uL (ref 4.0–10.5)

## 2015-03-29 LAB — POCT PREGNANCY, URINE: PREG TEST UR: POSITIVE — AB

## 2015-03-29 NOTE — MAU Provider Note (Signed)
Chief Complaint: Abdominal Cramping   First Provider Initiated Contact with Patient 03/29/15 2239      SUBJECTIVE HPI: Erica Clements is a 24 y.o. G2P1001 at [redacted]w[redacted]d by LMP who presents to maternity admissions reporting abdominal cramping that is intermittent, in the center of her lower abdomen, and unrelieved by ibuprofen, with onset 2 days ago.  Patient's last menstrual period was 02/22/2015 (exact date).  She is not using any contraception.  She was treated for PID in the ED on 02/04/15 and had negative pregnancy test at that time.   She denies vaginal bleeding, vaginal itching/burning, urinary symptoms, h/a, dizziness, n/v, or fever/chills.     Abdominal Cramping This is a new problem. The current episode started in the past 7 days. The onset quality is gradual. The problem occurs intermittently. The problem has been waxing and waning. The pain is located in the LLQ and RLQ. The pain is moderate. The quality of the pain is cramping. The abdominal pain does not radiate. Pertinent negatives include no constipation, diarrhea, dysuria, fever, frequency, headaches, nausea or vomiting. The pain is relieved by nothing. Treatments tried: ibuprofen. The treatment provided no relief.    Past Medical History  Diagnosis Date  . Medical history non-contributory    Past Surgical History  Procedure Laterality Date  . No past surgeries     Social History   Social History  . Marital Status: Single    Spouse Name: N/A  . Number of Children: N/A  . Years of Education: N/A   Occupational History  . Not on file.   Social History Main Topics  . Smoking status: Former Games developer  . Smokeless tobacco: Never Used  . Alcohol Use: Yes     Comment: Socially  . Drug Use: No  . Sexual Activity: Yes    Birth Control/ Protection: None   Other Topics Concern  . Not on file   Social History Narrative   No current facility-administered medications on file prior to encounter.   No current outpatient  prescriptions on file prior to encounter.   No Known Allergies  ROS:  Review of Systems  Constitutional: Negative for fever, chills and fatigue.  HENT: Negative for sinus pressure.   Eyes: Negative for photophobia.  Respiratory: Negative for shortness of breath.   Cardiovascular: Negative for chest pain.  Gastrointestinal: Negative for nausea, vomiting, diarrhea and constipation.  Genitourinary: Negative for dysuria, frequency, flank pain, vaginal bleeding, vaginal discharge, difficulty urinating, vaginal pain and pelvic pain.  Musculoskeletal: Negative for neck pain.  Neurological: Negative for dizziness, weakness and headaches.  Psychiatric/Behavioral: Negative.      I have reviewed patient's Past Medical Hx, Surgical Hx, Family Hx, Social Hx, medications and allergies.   Physical Exam   Patient Vitals for the past 24 hrs:  BP Pulse Resp  03/30/15 0017 (!) 101/54 mmHg 89 18   Constitutional: Well-developed, well-nourished female in no acute distress.  Cardiovascular: normal rate Respiratory: normal effort GI: Abd soft, non-tender. Pos BS x 4 MS: Extremities nontender, no edema, normal ROM Neurologic: Alert and oriented x 4.  GU: Neg CVAT.  PELVIC EXAM: Cervix pink, visually closed, without lesion, scant white creamy discharge, vaginal walls and external genitalia normal Bimanual exam: Cervix 0/long/high, firm, anterior, neg CMT, uterus nontender, nonenlarged, adnexa without tenderness, enlargement, or mass   LAB RESULTS Results for orders placed or performed during the hospital encounter of 03/29/15 (from the past 24 hour(s))  Urinalysis, Routine w reflex microscopic (not at [facility])  Status: Abnormal   Collection Time: 03/29/15 10:20 PM  Result Value Ref Range   Color, Urine YELLOW YELLOW   APPearance CLEAR CLEAR   Specific Gravity, Urine 1.020 1.005 - 1.030   pH 7.0 5.0 - 8.0   Glucose, UA NEGATIVE NEGATIVE mg/dL   Hgb urine dipstick NEGATIVE NEGATIVE   Bilirubin  Urine NEGATIVE NEGATIVE   Ketones, ur 15 (A) NEGATIVE mg/dL   Protein, ur NEGATIVE NEGATIVE mg/dL   Urobilinogen, UA 0.2 0.0 - 1.0 mg/dL   Nitrite NEGATIVE NEGATIVE   Leukocytes, UA NEGATIVE NEGATIVE  Pregnancy, urine POC     Status: Abnormal   Collection Time: 03/29/15 10:31 PM  Result Value Ref Range   Preg Test, Ur POSITIVE (A) NEGATIVE  CBC     Status: Abnormal   Collection Time: 03/29/15 10:55 PM  Result Value Ref Range   WBC 5.8 4.0 - 10.5 K/uL   RBC 3.76 (L) 3.87 - 5.11 MIL/uL   Hemoglobin 11.5 (L) 12.0 - 15.0 g/dL   HCT 16.1 (L) 09.6 - 04.5 %   MCV 88.6 78.0 - 100.0 fL   MCH 30.6 26.0 - 34.0 pg   MCHC 34.5 30.0 - 36.0 g/dL   RDW 40.9 81.1 - 91.4 %   Platelets 305 150 - 400 K/uL  hCG, quantitative, pregnancy     Status: Abnormal   Collection Time: 03/29/15 10:55 PM  Result Value Ref Range   hCG, Beta Chain, Quant, S 7235 (H) <5 mIU/mL  Wet prep, genital     Status: Abnormal   Collection Time: 03/29/15 10:55 PM  Result Value Ref Range   Yeast Wet Prep HPF POC NONE SEEN NONE SEEN   Trich, Wet Prep NONE SEEN NONE SEEN   Clue Cells Wet Prep HPF POC FEW (A) NONE SEEN   WBC, Wet Prep HPF POC FEW (A) NONE SEEN       IMAGING US Ob Comp Less 14 Wks  03/30/2015   CLINICAL DATA:  Abdominal pain. Five weeks and 0 days pregnant by last menstrual period.  EXAM: OBSTETRIC <14 WK [place] AND TRANSVAGINAL OB US  TECHNIQUE: Both transabdominal and transvaginal ultrasound examinations were performed for complete evaluation of the gestation as well as the maternal uterus, adnexal regions, and pelvic cul-de-sac. Transvaginal technique was performed to assess early pregnancy.  COMPARISON:  Pelvic ultrasound dated 02/04/2015.  FINDINGS: Intrauterine gestational sac: Visualized, somewhat flattened  Yolk sac:  Visualized, normal in shape  Embryo:  Not visualized  Cardiac Activity: Not applicable  MSD: 6.5  mm   5 w   2  d         [place] EDC: 11/27/2015  Maternal uterus/adnexae: Small subchorionic  hemorrhage. Normal appearing ovaries with a right ovarian corpus luteum noted. Small amount of free peritoneal fluid.  IMPRESSION: Single intrauterine gestation with an estimated gestational age of [redacted] weeks and 2 days. No fetal pole is visible at this time. Recommend follow-up quantitative B-HCG levels and follow-up US in 14 days to confirm and assess viability. This recommendation follows SRU consensus guidelines: Diagnostic Criteria for Nonviable Pregnancy Early in the First Trimester. Malva Limes Med 2013; 782:9562-13.   Electronically Signed   By: Beckie Salts M.D.   On: 03/30/2015 00:21   US Ob Transvaginal  03/30/2015   CLINICAL DATA:  Abdominal pain. Five weeks and 0 days pregnant by last menstrual period.  EXAM: OBSTETRIC <14 WK [place] AND TRANSVAGINAL OB US  TECHNIQUE: Both transabdominal and transvaginal ultrasound examinations were performed  for complete evaluation of the gestation as well as the maternal uterus, adnexal regions, and pelvic cul-de-sac. Transvaginal technique was performed to assess early pregnancy.  COMPARISON:  Pelvic ultrasound dated 02/04/2015.  FINDINGS: Intrauterine gestational sac: Visualized, somewhat flattened  Yolk sac:  Visualized, normal in shape  Embryo:  Not visualized  Cardiac Activity: Not applicable  MSD: 6.5  mm   5 w   2  d         [place] EDC: 11/27/2015  Maternal uterus/adnexae: Small subchorionic hemorrhage. Normal appearing ovaries with a right ovarian corpus luteum noted. Small amount of free peritoneal fluid.  IMPRESSION: Single intrauterine gestation with an estimated gestational age of [redacted] weeks and 2 days. No fetal pole is visible at this time. Recommend follow-up quantitative B-HCG levels and follow-up US in 14 days to confirm and assess viability. This recommendation follows SRU consensus guidelines: Diagnostic Criteria for Nonviable Pregnancy Early in the First Trimester. Malva Limes Med 2013; 401:0272-53.   Electronically Signed   By: Beckie Salts M.D.   On: 03/30/2015  00:21    MAU Management/MDM: Ordered labs and imaging and reviewed results.  Pt stable at time of discharge.  ASSESSMENT 1. Normal IUP (intrauterine pregnancy) on prenatal ultrasound, first trimester   2. Abdominal pain affecting pregnancy     PLAN Discharge home with first trimester precautions [facility] pending F/U with early prenatal care    Medication List    STOP taking these medications        doxycycline 100 MG capsule  Commonly known as:  VIBRAMYCIN     ibuprofen 200 MG tablet  Commonly known as:  ADVIL,MOTRIN      TAKE these medications        Prenatal Vitamins 28-0.8 MG Tabs  Take 1 tablet by mouth daily.       Follow-up Information    Follow up with [facility] Cristie Hem, MD.   Specialty:  Obstetrics and Gynecology   Why:  As needed   Contact information:   [address] [place] [address] [phone]       Follow up with [facility] [place] [facility]ERN[facility] ADMISSIONS.   Why:  As needed for emergencies   Contact information:   [address] [id number] mc [place] [address] [phone]      Sharen Counter Certified Nurse-Midwife 03/30/2015  1:19 AM

## 2015-03-29 NOTE — MAU Note (Signed)
Pt reports cramping for two days. Pt states that she has taken ibuprofen w/ no relief. Last taken at 7pm .

## 2015-03-30 DIAGNOSIS — O9989 Other specified diseases and conditions complicating pregnancy, childbirth and the puerperium: Secondary | ICD-10-CM

## 2015-03-30 DIAGNOSIS — R109 Unspecified abdominal pain: Secondary | ICD-10-CM

## 2015-03-30 DIAGNOSIS — O26891 Other specified pregnancy related conditions, first trimester: Secondary | ICD-10-CM | POA: Diagnosis not present

## 2015-03-30 LAB — HIV ANTIBODY (ROUTINE TESTING W REFLEX): HIV Screen 4th Generation wRfx: NONREACTIVE

## 2015-03-30 LAB — HCG, QUANTITATIVE, PREGNANCY: HCG, BETA CHAIN, QUANT, S: 7235 m[IU]/mL — AB (ref <5)

## 2015-03-30 MED ORDER — PRENATAL VITAMINS 28-0.8 MG PO TABS: 1.0000 | ORAL_TABLET | Freq: Every day | ORAL | Status: DC

## 2015-03-30 NOTE — Discharge Instructions (Signed)

## 2015-03-31 LAB — GC/CHLAMYDIA PROBE AMP (~~LOC~~) NOT AT ARMC
Chlamydia: NEGATIVE
Neisseria Gonorrhea: NEGATIVE

## 2015-05-15 LAB — OB RESULTS CONSOLE RUBELLA ANTIBODY, IGM: RUBELLA: IMMUNE

## 2015-05-15 LAB — OB RESULTS CONSOLE HEPATITIS B SURFACE ANTIGEN: Hepatitis B Surface Ag: NEGATIVE

## 2015-05-15 LAB — OB RESULTS CONSOLE RPR: RPR: NONREACTIVE

## 2015-05-15 LAB — OB RESULTS CONSOLE ANTIBODY SCREEN: ANTIBODY SCREEN: NEGATIVE

## 2015-05-15 LAB — OB RESULTS CONSOLE ABO/RH: RH TYPE: POSITIVE

## 2015-05-15 LAB — OB RESULTS CONSOLE HIV ANTIBODY (ROUTINE TESTING): HIV: NONREACTIVE

## 2015-05-21 LAB — OB RESULTS CONSOLE GC/CHLAMYDIA
Chlamydia: NEGATIVE
GC PROBE AMP, GENITAL: NEGATIVE

## 2015-07-06 NOTE — L&D Delivery Note (Signed)
SVD of VMI at 0847 on 11/25/15.  EBL 200cc.  APGARs 9,9.  Placenta to L&D. Head delivered ROA and body followed atraumatically.  Baby to abdomen.  Cord was clamped and cut.  Cord blood was obtained . Placenta delivered S/I/3VC.  Fundus was firmed with pitocin and massage.  Bilateral periurethral lacs and left vaginal lac were repaired with 3-0 Rapide in the normal fashion.  Mom and baby stable.  Mitchel HonourMegan Hajar Penninger, DO

## 2015-09-20 ENCOUNTER — Encounter (HOSPITAL_COMMUNITY): Payer: Self-pay | Admitting: *Deleted

## 2015-09-20 ENCOUNTER — Inpatient Hospital Stay (HOSPITAL_COMMUNITY)
Admission: AD | Admit: 2015-09-20 | Discharge: 2015-09-20 | Disposition: A | Discharge status: Home / Self Care | Payer: Managed Care, Other (non HMO) | Source: Ambulatory Visit | Attending: Obstetrics and Gynecology | Admitting: Obstetrics and Gynecology

## 2015-09-20 DIAGNOSIS — O26893 Other specified pregnancy related conditions, third trimester: Secondary | ICD-10-CM | POA: Diagnosis not present

## 2015-09-20 DIAGNOSIS — R1032 Left lower quadrant pain: Secondary | ICD-10-CM | POA: Diagnosis present

## 2015-09-20 DIAGNOSIS — R109 Unspecified abdominal pain: Secondary | ICD-10-CM | POA: Diagnosis not present

## 2015-09-20 DIAGNOSIS — E860 Dehydration: Secondary | ICD-10-CM | POA: Insufficient documentation

## 2015-09-20 DIAGNOSIS — O26899 Other specified pregnancy related conditions, unspecified trimester: Secondary | ICD-10-CM

## 2015-09-20 DIAGNOSIS — O9989 Other specified diseases and conditions complicating pregnancy, childbirth and the puerperium: Secondary | ICD-10-CM | POA: Diagnosis not present

## 2015-09-20 DIAGNOSIS — Z3A30 30 weeks gestation of pregnancy: Secondary | ICD-10-CM | POA: Insufficient documentation

## 2015-09-20 LAB — URINALYSIS, ROUTINE W REFLEX MICROSCOPIC
BILIRUBIN URINE: NEGATIVE
GLUCOSE, UA: NEGATIVE mg/dL
Hgb urine dipstick: NEGATIVE
KETONES UR: 15 mg/dL — AB
Nitrite: NEGATIVE
Protein, ur: NEGATIVE mg/dL
Specific Gravity, Urine: 1.025 (ref 1.005–1.030)
pH: 6 (ref 5.0–8.0)

## 2015-09-20 LAB — URINE MICROSCOPIC-ADD ON: RBC / HPF: NONE SEEN RBC/hpf (ref 0–5)

## 2015-09-20 MED ORDER — NITROFURANTOIN MONOHYD MACRO 100 MG PO CAPS: 100.0000 mg | ORAL_CAPSULE | Freq: Two times a day (BID) | ORAL | Status: DC

## 2015-09-20 NOTE — Discharge Instructions (Signed)

## 2015-09-20 NOTE — MAU Provider Note (Signed)
History     CSN: [id number]  Arrival date and time: 09/20/15 1513   First Provider Initiated Contact with Patient 09/20/15 1707      Chief Complaint  Patient presents with  . Abdominal Pain   HPI   Ms.Erica Clements is a 25 y.o. female G2P1001 at [redacted]w[redacted]d presenting with left lower quadrant pain; the pain started today. The pain feels like cramping pain. The pain comes and goes. She attests to dysuria. She took some tylenol this morning which did not help much. She currently denies pain at this time.   The patient does not drink water at all. She drinks 3-4 soda's per day.  + fetal movement, denies vaginal bleeding  OB History    Gravida Para Term Preterm AB TAB SAB Ectopic Multiple Living   Past Medical History  Diagnosis Date  . Medical history non-contributory     Past Surgical History  Procedure Laterality Date  . No past surgeries      Family History  Problem Relation Age of Onset  . Cancer Paternal Grandmother   . Diabetes Paternal Grandmother     Social History  Substance Use Topics  . Smoking status: Never Smoker   . Smokeless tobacco: Never Used  . Alcohol Use: No     Comment: Socially    Allergies: No Known Allergies  Prescriptions prior to admission  Medication Sig Dispense Refill Last Dose  . metroNIDAZOLE (METROGEL) 0.75 % vaginal gel Place 1 Applicatorful vaginally 2 (two) times daily.   09/19/2015 at Unknown time  . Prenatal Vit-Fe Fumarate-FA (PRENATAL VITAMINS) 28-0.8 MG TABS Take 1 tablet by mouth daily. (Patient not taking: Reported on 09/20/2015) 30 tablet 5 Not Taking at Unknown time   Results for orders placed or performed during the hospital encounter of 09/20/15 (from the past 48 hour(s))  Urinalysis, Routine w reflex microscopic (not at [facility])     Status: Abnormal   Collection Time: 09/20/15  3:25 PM  Result Value Ref Range   Color, Urine YELLOW YELLOW   APPearance CLEAR CLEAR   Specific Gravity, Urine 1.025 1.005 -  1.030   pH 6.0 5.0 - 8.0   Glucose, UA NEGATIVE NEGATIVE mg/dL   Hgb urine dipstick NEGATIVE NEGATIVE   Bilirubin Urine NEGATIVE NEGATIVE   Ketones, ur 15 (A) NEGATIVE mg/dL   Protein, ur NEGATIVE NEGATIVE mg/dL   Nitrite NEGATIVE NEGATIVE   Leukocytes, UA SMALL (A) NEGATIVE  Urine microscopic-add on     Status: Abnormal   Collection Time: 09/20/15  3:25 PM  Result Value Ref Range   Squamous Epithelial / LPF TOO NUMEROUS TO COUNT (A) NONE SEEN   WBC, UA 6-30 0 - 5 WBC/hpf   RBC / HPF NONE SEEN 0 - 5 RBC/hpf   Bacteria, UA MANY (A) NONE SEEN   Urine-Other MUCOUS PRESENT      Review of Systems  Constitutional: Negative for fever.  Gastrointestinal: Negative for nausea and vomiting.  Genitourinary: Positive for dysuria, urgency and frequency. Negative for flank pain.  Musculoskeletal: Positive for back pain.   Physical Exam   Blood pressure 107/58, pulse 103, temperature 98.4 F (36.9 C), temperature source Oral, resp. rate 18, height  (1.651 m), weight 154 lb 9.6 oz (70.126 kg), last menstrual period 02/22/2015, SpO2 99 %, not currently breastfeeding.  Physical Exam  Constitutional: She is oriented to person, place, and time. She appears well-developed and well-nourished.  No distress.  HENT:  Head: Normocephalic.  Eyes: Pupils are equal, round, and reactive to light.  GI: Soft. There is no tenderness.  Genitourinary:  Dilation: Fingertip (external os open, internal os closed) Effacement (%): Thick Position of cervix: middle  Exam by:: J Rasch NP  Musculoskeletal: Normal range of motion.  Neurological: She is alert and oriented to person, place, and time.  Skin: Skin is warm. She is not diaphoretic.  Psychiatric: Her behavior is normal.   Fetal Tracing: Baseline: 125 bpm  Variability: Moderate  Accelerations: 15x15 Decelerations: None Toco: none- quiet   MAU Course  Procedures  None  MDM  Urine culture pending.  1500: patient denies pain. Patient  sleeping on her side in the room at this time.  Discussed patient with Dr. Marcelle OverlieHolland at 1745   Assessment and Plan   A:  1. Abdominal pain in pregnancy   2. Mild dehydration     P:  Discharge home in stable condition Increase PO fluid intake Ok to use tylenol as directed on the bottle  Preterm labor precautions Return to MAU as needed if symptoms worsen.  Follow up with OB as scheduled   Duane LopeJennifer I Rasch, NP 09/20/2015 5:09 PM

## 2015-09-20 NOTE — MAU Note (Signed)
Patient states she started having left sided abdominal pain around 11am today that started suddenly and has been constant and ongoing.  Pt states she tried to eat to see if that would help, but it didn't.  Denies any vaginal bleeding or LOF.  Reports +fetal movement and that it hurts more when baby moves.

## 2015-09-22 LAB — CULTURE, OB URINE: Special Requests: NORMAL

## 2015-10-11 ENCOUNTER — Inpatient Hospital Stay (HOSPITAL_COMMUNITY): Payer: Managed Care, Other (non HMO)

## 2015-10-11 ENCOUNTER — Encounter (HOSPITAL_COMMUNITY): Payer: Self-pay | Admitting: *Deleted

## 2015-10-11 ENCOUNTER — Observation Stay (HOSPITAL_COMMUNITY)
Admission: AD | Admit: 2015-10-11 | Discharge: 2015-10-13 | Disposition: A | Discharge status: Home / Self Care | Payer: Managed Care, Other (non HMO) | Source: Ambulatory Visit | Attending: Obstetrics and Gynecology | Admitting: Obstetrics and Gynecology

## 2015-10-11 DIAGNOSIS — O4703 False labor before 37 completed weeks of gestation, third trimester: Secondary | ICD-10-CM | POA: Diagnosis not present

## 2015-10-11 DIAGNOSIS — Z3A33 33 weeks gestation of pregnancy: Secondary | ICD-10-CM | POA: Insufficient documentation

## 2015-10-11 LAB — URINALYSIS, ROUTINE W REFLEX MICROSCOPIC
Bilirubin Urine: NEGATIVE
Glucose, UA: 100 mg/dL — AB
Hgb urine dipstick: NEGATIVE
Ketones, ur: NEGATIVE mg/dL
Nitrite: NEGATIVE
PH: 6 (ref 5.0–8.0)
Protein, ur: NEGATIVE mg/dL
SPECIFIC GRAVITY, URINE: 1.015 (ref 1.005–1.030)

## 2015-10-11 LAB — URINE MICROSCOPIC-ADD ON

## 2015-10-11 LAB — CBC
HEMATOCRIT: 30.8 % — AB (ref 36.0–46.0)
HEMOGLOBIN: 10.5 g/dL — AB (ref 12.0–15.0)
MCH: 29.4 pg (ref 26.0–34.0)
MCHC: 34.1 g/dL (ref 30.0–36.0)
MCV: 86.3 fL (ref 78.0–100.0)
Platelets: 284 10*3/uL (ref 150–400)
RBC: 3.57 MIL/uL — ABNORMAL LOW (ref 3.87–5.11)
RDW: 12.9 % (ref 11.5–15.5)
WBC: 13 10*3/uL — ABNORMAL HIGH (ref 4.0–10.5)

## 2015-10-11 LAB — TYPE AND SCREEN
ABO/RH(D): B POS
Antibody Screen: NEGATIVE

## 2015-10-11 LAB — FETAL FIBRONECTIN: Fetal Fibronectin: NEGATIVE

## 2015-10-11 MED ORDER — ACETAMINOPHEN 325 MG PO TABS: 650.0000 mg | ORAL_TABLET | ORAL | Status: DC | PRN

## 2015-10-11 MED ORDER — CALCIUM CARBONATE ANTACID 500 MG PO CHEW: 2.0000 | CHEWABLE_TABLET | ORAL | Status: DC | PRN

## 2015-10-11 MED ORDER — ZOLPIDEM TARTRATE 5 MG PO TABS
5.0000 mg | ORAL_TABLET | Freq: Every evening | ORAL | Status: DC | PRN
  Administered 2015-10-11 – 2015-10-12 (×2): 5 mg via ORAL
  Filled 2015-10-11 (×2): qty 1

## 2015-10-11 MED ORDER — OXYCODONE-ACETAMINOPHEN 5-325 MG PO TABS
1.0000 | ORAL_TABLET | ORAL | Status: DC | PRN
  Administered 2015-10-11 – 2015-10-13 (×2): 1 via ORAL
  Filled 2015-10-11 (×2): qty 1

## 2015-10-11 MED ORDER — BETAMETHASONE SOD PHOS & ACET 6 (3-3) MG/ML IJ SUSP
12.0000 mg | INTRAMUSCULAR | Status: AC
  Administered 2015-10-11 – 2015-10-12 (×2): 12 mg via INTRAMUSCULAR
  Filled 2015-10-11 (×2): qty 2

## 2015-10-11 MED ORDER — TERBUTALINE SULFATE 1 MG/ML IJ SOLN
0.2500 mg | Freq: Once | INTRAMUSCULAR | Status: AC
  Administered 2015-10-11: 0.25 mg via SUBCUTANEOUS
  Filled 2015-10-11: qty 1

## 2015-10-11 MED ORDER — DOCUSATE SODIUM 100 MG PO CAPS
100.0000 mg | ORAL_CAPSULE | Freq: Every day | ORAL | Status: DC
  Administered 2015-10-12 – 2015-10-13 (×2): 100 mg via ORAL
  Filled 2015-10-11 (×2): qty 1

## 2015-10-11 MED ORDER — PRENATAL MULTIVITAMIN CH
1.0000 | ORAL_TABLET | Freq: Every day | ORAL | Status: DC
  Administered 2015-10-12 – 2015-10-13 (×2): 1 via ORAL
  Filled 2015-10-11 (×2): qty 1

## 2015-10-11 MED ORDER — SODIUM CHLORIDE 0.9 % IV SOLN
INTRAVENOUS | Status: DC
  Administered 2015-10-11 – 2015-10-13 (×3): via INTRAVENOUS

## 2015-10-11 NOTE — MAU Provider Note (Signed)
Chief Complaint:  Pelvic Pain   First Provider Initiated Contact with Patient 10/11/15 1228     HPI: Erica Clements is a 25 y.o. G2P1001 at 22w0dwho presents to maternity admissions reporting "vaginal pain" but points to suprapubic area when referring to vaginal pain.   When I told her the vagina was where you put a tampon, she states she does not have pain there.  States has had the pain for a week but has not told anyone at the office.  When asked if it was in the pubic bone, she says yes. It hurts more when she moves in bed or walks. She reports good fetal movement, denies LOF, vaginal bleeding, vaginal itching/burning, urinary symptoms, h/a, dizziness, n/v, diarrhea, constipation or fever/chills.  She denies headache, visual changes or upper abdominal pain.  Abdominal Pain This is a new problem. The current episode started in the past 7 days. The onset quality is gradual. The problem occurs intermittently. The problem has been unchanged. The pain is located in the suprapubic region. The pain is moderate. The quality of the pain is sharp. The abdominal pain does not radiate. Pertinent negatives include no constipation, diarrhea, dysuria, fever, headaches, nausea or vomiting. The pain is aggravated by certain positions, palpation and movement. The pain is relieved by being still. She has tried nothing for the symptoms.   Past Medical History: Past Medical History  Diagnosis Date  . Medical history non-contributory     Past obstetric history: OB History  Gravida Para Term Preterm AB SAB TAB Ectopic Multiple Living  # Outcome Date GA Lbr Len/2nd Weight Sex Delivery Anes PTL Lv  2 Current           1 Term 02/01/14 [redacted]w[redacted]d 309:30 / 00:36 9 lb (4.082 kg) M Vag-Spont EPI  Y      Past Surgical History: Past Surgical History  Procedure Laterality Date  . No past surgeries      Family History: Family History  Problem Relation Age of Onset  . Cancer Paternal Grandmother   .  Diabetes Paternal Grandmother     Social History: Social History  Substance Use Topics  . Smoking status: Never Smoker   . Smokeless tobacco: Never Used  . Alcohol Use: No     Comment: Socially    Allergies: No Known Allergies  Meds:  Prescriptions prior to admission  Medication Sig Dispense Refill Last Dose  . metroNIDAZOLE (METROGEL) 0.75 % vaginal gel Place 1 Applicatorful vaginally 2 (two) times daily.   09/19/2015 at Unknown time  . nitrofurantoin, macrocrystal-monohydrate, (MACROBID) 100 MG capsule Take 1 capsule (100 mg total) by mouth 2 (two) times daily. 10 capsule 0   . Prenatal Vit-Fe Fumarate-FA (PRENATAL VITAMINS) 28-0.8 MG TABS Take 1 tablet by mouth daily. (Patient not taking: Reported on 09/20/2015) 30 tablet 5 Not Taking at Unknown time    I have reviewed patient's Past Medical Hx, Surgical Hx, Family Hx, Social Hx, medications and allergies.   ROS:  Review of Systems  Constitutional: Negative for fever, chills and fatigue.  Respiratory: Negative for shortness of breath.   Gastrointestinal: Positive for abdominal pain. Negative for nausea, vomiting, diarrhea and constipation.  Genitourinary: Positive for vaginal pain and pelvic pain. Negative for dysuria, vaginal bleeding and vaginal discharge.  Musculoskeletal: Negative for back pain.  Neurological: Negative for weakness and headaches.   Other systems negative  Physical Exam  Patient Vitals for the past  24 hrs:  BP Temp Temp src Pulse Resp Weight  10/11/15 1209 110/64 mmHg 98.2 F (36.8 C) Oral 96 18 157 lb 6.4 oz (71.396 kg)   Constitutional: Well-developed, well-nourished female in no acute distress.  Cardiovascular: normal rate and rhythm Respiratory: normal effort, clear to auscultation bilaterally GI: Abd soft, non-tender, gravid appropriate for gestational age.   No rebound or guarding. MS: Extremities nontender, no edema, normal ROM Neurologic: Alert and oriented x 4.  GU: Neg CVAT.  PELVIC  EXAM: Cervix pink, visually closed, without lesion, scant white creamy discharge, vaginal walls and external genitalia normal Bimanual exam: Cervix soft, anterior,  uterus nontender, Fundal Height consistent with dates, adnexa without tenderness, enlargement, or mass  Movement of legs (such as bending knees for pelvic exam) makes her cry out in pain    Dilation: 1.5 Effacement (%): 90 Station: -2 Exam by:: Antony BlackbirdMarie Wiliams, CNM   FHT:  Baseline 145-150 , moderate variability, accelerations present, no decelerations Contractions: q 2-3 mins Irregular    DIfficult to trace   Labs: Results for orders placed or performed during the hospital encounter of 10/11/15 (from the past 24 hour(s))  Urinalysis, Routine w reflex microscopic (not at [facility])     Status: Abnormal   Collection Time: 10/11/15 12:10 PM  Result Value Ref Range   Color, Urine YELLOW YELLOW   APPearance CLEAR CLEAR   Specific Gravity, Urine 1.015 1.005 - 1.030   pH 6.0 5.0 - 8.0   Glucose, UA 100 (A) NEGATIVE mg/dL   Hgb urine dipstick NEGATIVE NEGATIVE   Bilirubin Urine NEGATIVE NEGATIVE   Ketones, ur NEGATIVE NEGATIVE mg/dL   Protein, ur NEGATIVE NEGATIVE mg/dL   Nitrite NEGATIVE NEGATIVE   Leukocytes, UA MODERATE (A) NEGATIVE  Urine microscopic-add on     Status: Abnormal   Collection Time: 10/11/15 12:10 PM  Result Value Ref Range   Squamous Epithelial / LPF 6-30 (A) NONE SEEN   WBC, UA 6-30 0 - 5 WBC/hpf   RBC / HPF 0-5 0 - 5 RBC/hpf   Bacteria, UA FEW (A) NONE SEEN  Fetal fibronectin     Status: None   Collection Time: 10/11/15  1:10 PM  Result Value Ref Range   Fetal Fibronectin NEGATIVE NEGATIVE    Imaging:   AFI 14.7cm Cervical length 2cm Posterior placenta  MAU Course/MDM: I have ordered labs and reviewed results.  NST reviewed Consult Dr Marcelle OverlieHolland with presentation, exam findings and test results.   Will get US to measure Cervical length  >>  Films state 1.9cm, report states 2.0cm  Per Dr Marcelle OverlieHolland,  will admit for steroids and observation  Assessment: SIUP at 3240w0d  Preterm contractions with effacement and dilation of cervix Reactive FHR  Plan: Admit to Antenatal  Observation Betamethasone MD to follow    Medication List    ASK your doctor about these medications        metroNIDAZOLE 0.75 % vaginal gel  Commonly known as:  METROGEL  Place 1 Applicatorful vaginally 2 (two) times daily.     nitrofurantoin (macrocrystal-monohydrate) 100 MG capsule  Commonly known as:  MACROBID  Take 1 capsule (100 mg total) by mouth 2 (two) times daily.     Prenatal Vitamins 28-0.8 MG Tabs  Take 1 tablet by mouth daily.       Pt stable at time of discharge.  Wynelle BourgeoisMarie Kimberley Speece CNM, MSN Certified Nurse-Midwife 10/11/2015 12:44 PM

## 2015-10-11 NOTE — Progress Notes (Signed)
Pt still eating, up to bathroom.  ? fhr tracing after 1722

## 2015-10-11 NOTE — Progress Notes (Signed)
Pt finished eating  

## 2015-10-11 NOTE — Progress Notes (Signed)
Pt sitting up to eat fhr intermittent

## 2015-10-11 NOTE — Progress Notes (Signed)
Dr. Marcelle OverlieHolland notified that pt is requesting pain medications.  Provider gave verbal pain medication order.

## 2015-10-11 NOTE — H&P (Signed)
Erica Clements is a 25 y.o. female presenting for pelvic discomfort, in MAU had cx exam 1-2/90% with 1.9 cm cx on US, adm for BMZ + OBSV. Maternal Medical History:  Reason for admission: Contractions.   Contractions: Onset was 3-5 hours ago.    Fetal activity: Perceived fetal activity is normal.   Last perceived fetal movement was within the past hour.      OB History    Gravida Para Term Preterm AB TAB SAB Ectopic Multiple Living   2 1 1       1      Past Medical History  Diagnosis Date  . Medical history non-contributory    Past Surgical History  Procedure Laterality Date  . No past surgeries     Family History: family history includes Cancer in her paternal grandmother; Diabetes in her paternal grandmother. Social History:  reports that she has never smoked. She has never used smokeless tobacco. She reports that she does not drink alcohol or use illicit drugs.   Prenatal Transfer Tool  Maternal Diabetes: No Genetic Screening: Normal Maternal Ultrasounds/Referrals: Normal Fetal Ultrasounds or other Referrals:  None Maternal Substance Abuse:  No Significant Maternal Medications:  None Significant Maternal Lab Results:  None Other Comments:  None  ROS  Dilation: 1.5 Effacement (%): 90 Station: -2 Exam by:: Antony BlackbirdMarie Wiliams, CNM Blood pressure 121/68, pulse 106, temperature 98.1 F (36.7 C), temperature source Oral, resp. rate 20, height 5\' 5"  (1.651 m), weight 157 lb (71.215 kg), last menstrual period 02/22/2015, not currently breastfeeding. Exam Physical Exam  Constitutional: She is oriented to person, place, and time. She appears well-developed and well-nourished.  HENT:  Head: Normocephalic and atraumatic.  Neck: Normal range of motion. Neck supple.  Cardiovascular: Normal rate and regular rhythm.   Respiratory: Effort normal and breath sounds normal.  GI:  Fundus soft, nontender, FHR 142  Genitourinary:  1-2/90%/vtx  Musculoskeletal: Normal range of motion.   Neurological: She is alert and oriented to person, place, and time.    Prenatal labs: ABO, Rh: B/Positive/-- (11/10 0000) Antibody: Negative (11/10 0000) Rubella: Immune (11/10 0000) RPR: Nonreactive (11/10 0000)  HBsAg: Negative (11/10 0000)  HIV: Non-reactive (11/10 0000)  GBS:     Assessment/Plan: 1853w0d PTL>>for OBSV + BMZ   Erica Clements M 10/11/2015, 6:19 PM

## 2015-10-11 NOTE — MAU Note (Signed)
Lot of pressure in vagina, going on for past wk, got worse this morning.

## 2015-10-12 NOTE — Progress Notes (Signed)
Patient ID: Erica Clements, female   DOB: 05-May-1991, 25 y.o.   MRN: [id number] 2245w1d  S// c/o suprapubic pain when she walks, no leaking, bleeding or ctx   O//BP 97/42 mmHg  Pulse 112  Temp(Src) 98.5 F (36.9 C) (Oral)  Resp 20  Ht 5\' 5"  (1.651 m)  Wt 157 lb (71.215 kg)  BMI 26.13 kg/m2  LMP 02/22/2015 (Exact Date)  FHR stable, toco only some irritability  Repeat cx exam this am>> 1/post/vtx  A+P//4145w1d, PTL, for BMZ #2 @ 1600, poss D/C in AM

## 2015-10-13 NOTE — Progress Notes (Signed)
Reviewed D/C instructions with Erica Clements.  Copy given to Erica Clements.  Keep appt with Dr Renaldo FiddlerAdkins office on Thursday 10/16/15.

## 2015-10-13 NOTE — Discharge Summary (Signed)
Physician Discharge Summary  Patient ID: Erica Clements MRN: [id number] DOB/AGE: 07-Nov-1990 24 y.o.  Admit date: 10/11/2015 Discharge date: 10/13/2015  Admission Diagnoses: PTL  Discharge Diagnoses:  Active Problems:   Preterm contractions   Preterm labor   Discharged Condition: stable  Hospital Course: Pt admitted with preterm contractions.  Contractions resolved with procardia.  Pt received BMZ series and cervix remained unchanged.  Today, pt denies ctx, vb and lof.  + FM  Consults: None  Significant Diagnostic Studies:    Treatments: steroids: BMZ  Discharge Exam: Blood pressure 115/50, pulse 100, temperature 98 F (36.7 C), temperature source Axillary, resp. rate 14, height 5\' 5"  (1.651 m), weight 157 lb (71.215 kg), last menstrual period 02/22/2015, not currently breastfeeding. General appearance: alert and cooperative GI: normal findings: gravid, NT  Disposition: 01-Home or Self Care     Medication List    STOP taking these medications        nitrofurantoin (macrocrystal-monohydrate) 100 MG capsule  Commonly known as:  MACROBID      TAKE these medications        Prenatal Vitamins 28-0.8 MG Tabs  Take 1 tablet by mouth daily.           Follow-up Information    Schedule an appointment as soon as possible for a visit in 3 days to follow up.      Signed: Tanija Germani 10/13/2015, 8:39 AM

## 2015-10-16 ENCOUNTER — Encounter (HOSPITAL_COMMUNITY): Payer: Self-pay | Admitting: *Deleted

## 2015-10-16 ENCOUNTER — Inpatient Hospital Stay (HOSPITAL_COMMUNITY)
Admission: AD | Admit: 2015-10-16 | Discharge: 2015-10-16 | Disposition: A | Discharge status: Home / Self Care | Payer: Managed Care, Other (non HMO) | Source: Ambulatory Visit | Attending: Obstetrics and Gynecology | Admitting: Obstetrics and Gynecology

## 2015-10-16 DIAGNOSIS — R109 Unspecified abdominal pain: Secondary | ICD-10-CM | POA: Diagnosis not present

## 2015-10-16 DIAGNOSIS — Z3A34 34 weeks gestation of pregnancy: Secondary | ICD-10-CM | POA: Diagnosis not present

## 2015-10-16 DIAGNOSIS — O4703 False labor before 37 completed weeks of gestation, third trimester: Secondary | ICD-10-CM | POA: Diagnosis not present

## 2015-10-16 DIAGNOSIS — O26893 Other specified pregnancy related conditions, third trimester: Secondary | ICD-10-CM | POA: Insufficient documentation

## 2015-10-16 DIAGNOSIS — Z3A33 33 weeks gestation of pregnancy: Secondary | ICD-10-CM | POA: Diagnosis not present

## 2015-10-16 LAB — URINALYSIS, ROUTINE W REFLEX MICROSCOPIC
BILIRUBIN URINE: NEGATIVE
Glucose, UA: NEGATIVE mg/dL
Ketones, ur: 15 mg/dL — AB
NITRITE: NEGATIVE
PROTEIN: 30 mg/dL — AB
SPECIFIC GRAVITY, URINE: 1.02 (ref 1.005–1.030)
pH: 6.5 (ref 5.0–8.0)

## 2015-10-16 LAB — URINE MICROSCOPIC-ADD ON

## 2015-10-16 MED ORDER — NIFEDIPINE 10 MG PO CAPS: 10.0000 mg | ORAL_CAPSULE | Freq: Three times a day (TID) | ORAL | Status: DC

## 2015-10-16 MED ORDER — LACTATED RINGERS IV BOLUS (SEPSIS)
1000.0000 mL | Freq: Once | INTRAVENOUS | Status: AC
  Administered 2015-10-16: 1000 mL via INTRAVENOUS

## 2015-10-16 MED ORDER — TERBUTALINE SULFATE 1 MG/ML IJ SOLN
0.2500 mg | Freq: Once | INTRAMUSCULAR | Status: AC
  Administered 2015-10-16: 0.25 mg via SUBCUTANEOUS
  Filled 2015-10-16: qty 1

## 2015-10-16 NOTE — MAU Provider Note (Signed)
  History     CSN: [id number]  Arrival date and time: 10/16/15 1249   First Provider Initiated Contact with Patient 10/16/15 1412      Chief Complaint  Patient presents with  . Contractions   HPI  Erica Clements 25 y.o. G2P1001 @ 6584w5d presents to MAU stating that she has been having uncomfortable contractions and vaginal pressure since 1100am. Pt has received a round of Betamethasone.    Past Medical History  Diagnosis Date  . Medical history non-contributory     Past Surgical History  Procedure Laterality Date  . No past surgeries      Family History  Problem Relation Age of Onset  . Cancer Paternal Grandmother   . Diabetes Paternal Grandmother     Social History  Substance Use Topics  . Smoking status: Never Smoker   . Smokeless tobacco: Never Used  . Alcohol Use: No     Comment: Socially    Allergies: No Known Allergies  Prescriptions prior to admission  Medication Sig Dispense Refill Last Dose  . Prenatal Vit-Fe Fumarate-FA (PRENATAL VITAMINS) 28-0.8 MG TABS Take 1 tablet by mouth daily. (Patient not taking: Reported on 09/20/2015) 30 tablet 5 Not Taking at Unknown time    Review of Systems  Constitutional: Negative for fever.  Gastrointestinal: Positive for abdominal pain.  All other systems reviewed and are negative.  Physical Exam   Blood pressure 108/62, pulse 111, temperature 98.3 F (36.8 C), resp. rate 16, last menstrual period 02/22/2015, not currently breastfeeding.  Physical Exam  Nursing note and vitals reviewed. Constitutional: She is oriented to person, place, and time. She appears well-developed and well-nourished. No distress.  HENT:  Head: Normocephalic and atraumatic.  Cardiovascular: Normal rate and regular rhythm.   Respiratory: Effort normal. No respiratory distress.  GI: Soft. There is no tenderness.  Genitourinary: Vagina normal.  Musculoskeletal: Normal range of motion.  Neurological: She is alert and oriented to person,  place, and time.  Skin: Skin is warm and dry.  Psychiatric: She has a normal mood and affect. Her behavior is normal. Judgment and thought content normal.   Dilation: 2 Effacement (%): 80 Station: -1 Presentation: Vertex Exam by:: ginger morris rn  MAU Course  Procedures  MDM Pt received 1 bag LR; Approximately 3 contractions in the past hour and no cervical change. Discussed POC with Dr. Marcelle OverlieHolland. Pt will be given a dose of terbutaline and then sent home on Procardia over the holiday weekend. She is to follow up at the office on Monday. Fhr reactive. Category 1.  Assessment and Plan  Preterm Contractions Procardia 10mg  q 8 hours x 5 days Follow up at the office on Monday Return to MAU if further problems Discharge  Clemmons,Lori Grissett 10/16/2015, 2:13 PM

## 2015-10-16 NOTE — Discharge Instructions (Signed)

## 2015-11-03 ENCOUNTER — Encounter (HOSPITAL_COMMUNITY): Payer: Self-pay

## 2015-11-03 ENCOUNTER — Inpatient Hospital Stay (HOSPITAL_COMMUNITY)
Admission: AD | Admit: 2015-11-03 | Discharge: 2015-11-03 | Disposition: A | Discharge status: Home / Self Care | Payer: Managed Care, Other (non HMO) | Source: Ambulatory Visit | Attending: Obstetrics and Gynecology | Admitting: Obstetrics and Gynecology

## 2015-11-03 DIAGNOSIS — Z3A36 36 weeks gestation of pregnancy: Secondary | ICD-10-CM | POA: Diagnosis not present

## 2015-11-03 DIAGNOSIS — O4703 False labor before 37 completed weeks of gestation, third trimester: Secondary | ICD-10-CM | POA: Diagnosis not present

## 2015-11-03 DIAGNOSIS — O98813 Other maternal infectious and parasitic diseases complicating pregnancy, third trimester: Secondary | ICD-10-CM | POA: Diagnosis not present

## 2015-11-03 MED ORDER — LACTATED RINGERS IV BOLUS (SEPSIS)
1000.0000 mL | Freq: Once | INTRAVENOUS | Status: AC
  Administered 2015-11-03: 1000 mL via INTRAVENOUS

## 2015-11-03 MED ORDER — OXYCODONE-ACETAMINOPHEN 5-325 MG PO TABS
1.0000 | ORAL_TABLET | Freq: Once | ORAL | Status: AC
Start: 2015-11-03 — End: 2015-11-03
  Administered 2015-11-03: 1 via ORAL
  Filled 2015-11-03: qty 1

## 2015-11-03 NOTE — MAU Provider Note (Signed)
S:  Ms..Erica Clements is a 25 y.o. female G2P1001 At 7274w2d presenting with contractions. The contractions started at 1200 noon today. I was called in by the nurses due to the patients discomfort level.  Patient reports being 3 cm at her office visit this week.   + fetal movement Denies vaginal bleeding or leaking of fluid.   Feeling contractions every 4 hours.    O:  GENERAL: Well-developed, well-nourished female in no acute distress.  LUNGS: Effort normal SKIN: Warm, dry and without erythema PSYCH: Normal mood and affect ABDOMEN: Soft, non tender    Filed Vitals:   11/03/15 1323 11/03/15 1329  BP:  106/67  Pulse:  80  Temp: 98.1 F (36.7 C)   Resp:  18   No results found for this or any previous visit (from the past 48 hour(s)).  Dilation: 3 Effacement (%): 70 Exam by:: j. Amneet Cendejas,np    A:  Braxton hicks contractions Pelvic pressure   P:  Percocet 1 tab Rn to call Dr. Rana SnareLowe for further orders.  Category 1 fetal tracing.   Duane LopeJennifer I Doni Bacha, NP 11/03/2015 2:09 PM

## 2015-11-04 ENCOUNTER — Inpatient Hospital Stay (HOSPITAL_COMMUNITY)
Admission: AD | Admit: 2015-11-04 | Discharge: 2015-11-04 | Disposition: A | Discharge status: Home / Self Care | Payer: Managed Care, Other (non HMO) | Source: Ambulatory Visit | Attending: Obstetrics and Gynecology | Admitting: Obstetrics and Gynecology

## 2015-11-04 DIAGNOSIS — O98813 Other maternal infectious and parasitic diseases complicating pregnancy, third trimester: Secondary | ICD-10-CM | POA: Diagnosis not present

## 2015-11-04 DIAGNOSIS — B3731 Acute candidiasis of vulva and vagina: Secondary | ICD-10-CM

## 2015-11-04 DIAGNOSIS — B373 Candidiasis of vulva and vagina: Secondary | ICD-10-CM

## 2015-11-04 MED ORDER — TRIAMCINOLONE ACETONIDE 0.1 % EX CREA: 1.0000 "application " | TOPICAL_CREAM | Freq: Two times a day (BID) | CUTANEOUS | Status: DC

## 2015-11-04 MED ORDER — NYSTATIN 100000 UNIT/GM EX CREA: TOPICAL_CREAM | CUTANEOUS | Status: DC

## 2015-11-04 NOTE — Discharge Instructions (Signed)
Cutaneous Candidiasis °Cutaneous candidiasis is a condition in which there is an overgrowth of yeast (candida) on the skin. Yeast normally live on the skin, but in small enough numbers not to cause any symptoms. In certain cases, increased growth of the yeast may cause an actual yeast infection. This kind of infection usually occurs in areas of the skin that are constantly warm and moist, such as the armpits or the groin. Yeast is the most common cause of diaper rash in babies and in people who cannot control their bowel movements (incontinence). °CAUSES  °The fungus that most often causes cutaneous candidiasis is Candida albicans. Conditions that can increase the risk of getting a yeast infection of the skin include: °· Obesity. °· Pregnancy. °· Diabetes. °· Taking antibiotic medicine. °· Taking birth control pills. °· Taking steroid medicines. °· Thyroid disease. °· An iron or zinc deficiency. °· Problems with the immune system. °SYMPTOMS  °· Red, swollen area of the skin. °· Bumps on the skin. °· Itchiness. °DIAGNOSIS  °The diagnosis of cutaneous candidiasis is usually based on its appearance. Light scrapings of the skin may also be taken and viewed under a microscope to identify the presence of yeast. °TREATMENT  °Antifungal creams may be applied to the infected skin. In severe cases, oral medicines may be needed.  °HOME CARE INSTRUCTIONS  °· Keep your skin clean and dry. °· Maintain a healthy weight. °· If you have diabetes, keep your blood sugar under control. °SEEK IMMEDIATE MEDICAL CARE IF: °· Your rash continues to spread despite treatment. °· You have a fever, chills, or abdominal pain. °  °This information is not intended to replace advice given to you by your health care provider. Make sure you discuss any questions you have with your health care provider. °  °Document Released: 03/09/2011 Document Revised: 09/13/2011 Document Reviewed: 12/23/2014 °Elsevier Interactive Patient Education ©2016 [employer] ° °

## 2015-11-04 NOTE — MAU Note (Signed)
Urine collected and sent to lab.

## 2015-11-04 NOTE — MAU Provider Note (Signed)
  History     CSN: [id number]  Arrival date and time: 11/04/15 1949   First Provider Initiated Contact with Patient 11/04/15 2055      Chief Complaint  Patient presents with  . Vaginal Pain   HPI  Erica Clements is a 25 y.o. G2P1001 at 8545w3d who presents to MAU today with complaint of left labial swelling since this morning. The patient was seen in MAU yesterday for preterm contractions. She denies contractions today. She also denies vaginal bleeding, discharge or LOF. She does endorse some itching and irritation and states labia is painful with walking. She reports good fetal movement.   OB History    Gravida Para Term Preterm AB TAB SAB Ectopic Multiple Living   2 1 1       1       Past Medical History  Diagnosis Date  . Medical history non-contributory     Past Surgical History  Procedure Laterality Date  . No past surgeries      Family History  Problem Relation Age of Onset  . Cancer Paternal Grandmother   . Diabetes Paternal Grandmother     Social History  Substance Use Topics  . Smoking status: Never Smoker   . Smokeless tobacco: Never Used  . Alcohol Use: No     Comment: Socially    Allergies: No Known Allergies  No prescriptions prior to admission    Review of Systems  Constitutional: Negative for fever and malaise/fatigue.  Gastrointestinal: Negative for abdominal pain.  Genitourinary:       Neg - vaginal bleeding, discharge, LOF + vaginal swelling, pain   Physical Exam   Blood pressure 122/65, pulse 102, temperature 97.9 F (36.6 C), temperature source Oral, resp. rate 17, height 5\' 5"  (1.651 m), weight 170 lb (77.111 kg), last menstrual period 02/22/2015, SpO2 99 %, not currently breastfeeding.  Physical Exam  Nursing note and vitals reviewed. Constitutional: She is oriented to person, place, and time. She appears well-developed and well-nourished. No distress.  HENT:  Head: Normocephalic and atraumatic.  Cardiovascular: Normal rate.     Respiratory: Effort normal.  GI: Soft. She exhibits no distension.  Genitourinary:    There is no rash, tenderness, lesion or injury on the right labia. There is tenderness on the left labia. There is no rash, lesion or injury on the left labia.  Neurological: She is alert and oriented to person, place, and time.  Skin: Skin is warm and dry. No erythema.  Psychiatric: She has a normal mood and affect.    Fetal Monitoring: Baseline: 120 bpm Variability: moderate Accelerations: 15 x 15 Decelerations: none Contractions: mild to moderate UI  MAU Course  Procedures None  MDM Discussed patient with Dr. Marcelle OverlieHolland. Agrees with plan for topical yeast treatment at this time.    Assessment and Plan  A: SIUP at 8645w3d Cutaneous yeast, vaginal  P: Discharge home Rx for Nystatin and triamcinolone cream given to patient  Preterm labor precautions discussed Patient advised to follow-up with [facility] Patient may return to MAU as needed or if her condition were to change or worsen  Marny LowensteinJulie N Saamiya Jeppsen, PA-C  11/05/2015, 1:36 AM

## 2015-11-04 NOTE — MAU Note (Signed)
Pt c/o swollen vagina that she noticed today. Denies vaginal discharge or odor. Has some itching. States that it hurts 9/10. Denies contractions. +FM.

## 2015-11-18 ENCOUNTER — Encounter (HOSPITAL_COMMUNITY): Payer: Self-pay | Admitting: *Deleted

## 2015-11-18 ENCOUNTER — Inpatient Hospital Stay (HOSPITAL_COMMUNITY)
Admission: AD | Admit: 2015-11-18 | Discharge: 2015-11-18 | Disposition: A | Discharge status: Home / Self Care | Payer: Managed Care, Other (non HMO) | Source: Ambulatory Visit | Attending: Obstetrics and Gynecology | Admitting: Obstetrics and Gynecology

## 2015-11-18 DIAGNOSIS — Z3493 Encounter for supervision of normal pregnancy, unspecified, third trimester: Secondary | ICD-10-CM | POA: Diagnosis present

## 2015-11-18 NOTE — MAU Note (Signed)
Pt states she has been having contractions since she woke up this morning and they have progressively gotten worse.  Denies lOF/VB.

## 2015-11-18 NOTE — Discharge Instructions (Signed)
Fetal Movement Counts  Patient Name: __________________________________________________ Patient Due Date: ____________________  Performing a fetal movement count is highly recommended in high-risk pregnancies, but it is good for every pregnant woman to do. Your health care provider may ask you to start counting fetal movements at 28 weeks of the pregnancy. Fetal movements often increase:  · After eating a full meal.  · After physical activity.  · After eating or drinking something sweet or cold.  · At rest.  Pay attention to when you feel the baby is most active. This will help you notice a pattern of your baby's sleep and wake cycles and what factors contribute to an increase in fetal movement. It is important to perform a fetal movement count at the same time each day when your baby is normally most active.   HOW TO COUNT FETAL MOVEMENTS  1. Find a quiet and comfortable area to sit or lie down on your left side. Lying on your left side provides the best blood and oxygen circulation to your baby.  2. Write down the day and time on a sheet of paper or in a journal.  3. Start counting kicks, flutters, swishes, rolls, or jabs in a 2-hour period. You should feel at least 10 movements within 2 hours.  4. If you do not feel 10 movements in 2 hours, wait 2-3 hours and count again. Look for a change in the pattern or not enough counts in 2 hours.  SEEK MEDICAL CARE IF:  · You feel less than 10 counts in 2 hours, tried twice.  · There is no movement in over an hour.  · The pattern is changing or taking longer each day to reach 10 counts in 2 hours.  · You feel the baby is not moving as he or she usually does.  Date: ____________ Movements: ____________ Start time: ____________ Finish time: ____________   Date: ____________ Movements: ____________ Start time: ____________ Finish time: ____________  Date: ____________ Movements: ____________ Start time: ____________ Finish time: ____________  Date: ____________ Movements:  ____________ Start time: ____________ Finish time: ____________  Date: ____________ Movements: ____________ Start time: ____________ Finish time: ____________  Date: ____________ Movements: ____________ Start time: ____________ Finish time: ____________  Date: ____________ Movements: ____________ Start time: ____________ Finish time: ____________  Date: ____________ Movements: ____________ Start time: ____________ Finish time: ____________   Date: ____________ Movements: ____________ Start time: ____________ Finish time: ____________  Date: ____________ Movements: ____________ Start time: ____________ Finish time: ____________  Date: ____________ Movements: ____________ Start time: ____________ Finish time: ____________  Date: ____________ Movements: ____________ Start time: ____________ Finish time: ____________  Date: ____________ Movements: ____________ Start time: ____________ Finish time: ____________  Date: ____________ Movements: ____________ Start time: ____________ Finish time: ____________  Date: ____________ Movements: ____________ Start time: ____________ Finish time: ____________   Date: ____________ Movements: ____________ Start time: ____________ Finish time: ____________  Date: ____________ Movements: ____________ Start time: ____________ Finish time: ____________  Date: ____________ Movements: ____________ Start time: ____________ Finish time: ____________  Date: ____________ Movements: ____________ Start time: ____________ Finish time: ____________  Date: ____________ Movements: ____________ Start time: ____________ Finish time: ____________  Date: ____________ Movements: ____________ Start time: ____________ Finish time: ____________  Date: ____________ Movements: ____________ Start time: ____________ Finish time: ____________   Date: ____________ Movements: ____________ Start time: ____________ Finish time: ____________  Date: ____________ Movements: ____________ Start time: ____________ Finish  time: ____________  Date: ____________ Movements: ____________ Start time: ____________ Finish time: ____________  Date: ____________ Movements: ____________ Start time:   ____________ Finish time: ____________  Date: ____________ Movements: ____________ Start time: ____________ Finish time: ____________  Date: ____________ Movements: ____________ Start time: ____________ Finish time: ____________  Date: ____________ Movements: ____________ Start time: ____________ Finish time: ____________   Date: ____________ Movements: ____________ Start time: ____________ Finish time: ____________  Date: ____________ Movements: ____________ Start time: ____________ Finish time: ____________  Date: ____________ Movements: ____________ Start time: ____________ Finish time: ____________  Date: ____________ Movements: ____________ Start time: ____________ Finish time: ____________  Date: ____________ Movements: ____________ Start time: ____________ Finish time: ____________  Date: ____________ Movements: ____________ Start time: ____________ Finish time: ____________  Date: ____________ Movements: ____________ Start time: ____________ Finish time: ____________   Date: ____________ Movements: ____________ Start time: ____________ Finish time: ____________  Date: ____________ Movements: ____________ Start time: ____________ Finish time: ____________  Date: ____________ Movements: ____________ Start time: ____________ Finish time: ____________  Date: ____________ Movements: ____________ Start time: ____________ Finish time: ____________  Date: ____________ Movements: ____________ Start time: ____________ Finish time: ____________  Date: ____________ Movements: ____________ Start time: ____________ Finish time: ____________  Date: ____________ Movements: ____________ Start time: ____________ Finish time: ____________   Date: ____________ Movements: ____________ Start time: ____________ Finish time: ____________  Date: ____________  Movements: ____________ Start time: ____________ Finish time: ____________  Date: ____________ Movements: ____________ Start time: ____________ Finish time: ____________  Date: ____________ Movements: ____________ Start time: ____________ Finish time: ____________  Date: ____________ Movements: ____________ Start time: ____________ Finish time: ____________  Date: ____________ Movements: ____________ Start time: ____________ Finish time: ____________  Date: ____________ Movements: ____________ Start time: ____________ Finish time: ____________   Date: ____________ Movements: ____________ Start time: ____________ Finish time: ____________  Date: ____________ Movements: ____________ Start time: ____________ Finish time: ____________  Date: ____________ Movements: ____________ Start time: ____________ Finish time: ____________  Date: ____________ Movements: ____________ Start time: ____________ Finish time: ____________  Date: ____________ Movements: ____________ Start time: ____________ Finish time: ____________  Date: ____________ Movements: ____________ Start time: ____________ Finish time: ____________     This information is not intended to replace advice given to you by your health care provider. Make sure you discuss any questions you have with your health care provider.     Document Released: 07/21/2006 Document Revised: 07/12/2014 Document Reviewed: 04/17/2012  Elsevier Interactive Patient Education ©2016 [employer]

## 2015-11-21 ENCOUNTER — Encounter (HOSPITAL_COMMUNITY): Payer: Self-pay | Admitting: *Deleted

## 2015-11-21 ENCOUNTER — Telehealth (HOSPITAL_COMMUNITY): Payer: Self-pay | Admitting: *Deleted

## 2015-11-21 NOTE — Telephone Encounter (Signed)
Preadmission screen  

## 2015-11-25 ENCOUNTER — Inpatient Hospital Stay (HOSPITAL_COMMUNITY): Payer: Managed Care, Other (non HMO) | Admitting: Anesthesiology

## 2015-11-25 ENCOUNTER — Inpatient Hospital Stay (HOSPITAL_COMMUNITY)
Admission: AD | Admit: 2015-11-25 | Discharge: 2015-11-27 | DRG: 775 | Disposition: A | Discharge status: Home / Self Care | Payer: Managed Care, Other (non HMO) | Source: Ambulatory Visit | Attending: Obstetrics & Gynecology | Admitting: Obstetrics & Gynecology

## 2015-11-25 ENCOUNTER — Encounter (HOSPITAL_COMMUNITY): Payer: Self-pay | Admitting: *Deleted

## 2015-11-25 DIAGNOSIS — Z3A39 39 weeks gestation of pregnancy: Secondary | ICD-10-CM

## 2015-11-25 DIAGNOSIS — Z833 Family history of diabetes mellitus: Secondary | ICD-10-CM

## 2015-11-25 DIAGNOSIS — O99824 Streptococcus B carrier state complicating childbirth: Secondary | ICD-10-CM | POA: Diagnosis present

## 2015-11-25 DIAGNOSIS — O0990 Supervision of high risk pregnancy, unspecified, unspecified trimester: Secondary | ICD-10-CM

## 2015-11-25 LAB — RPR: RPR Ser Ql: NONREACTIVE

## 2015-11-25 LAB — CBC
HEMATOCRIT: 31.4 % — AB (ref 36.0–46.0)
HEMOGLOBIN: 10.4 g/dL — AB (ref 12.0–15.0)
MCH: 28.4 pg (ref 26.0–34.0)
MCHC: 33.1 g/dL (ref 30.0–36.0)
MCV: 85.8 fL (ref 78.0–100.0)
Platelets: 286 10*3/uL (ref 150–400)
RBC: 3.66 MIL/uL — ABNORMAL LOW (ref 3.87–5.11)
RDW: 14.2 % (ref 11.5–15.5)
WBC: 10.1 10*3/uL (ref 4.0–10.5)

## 2015-11-25 LAB — TYPE AND SCREEN
ABO/RH(D): B POS
Antibody Screen: NEGATIVE

## 2015-11-25 LAB — OB RESULTS CONSOLE GBS: GBS: POSITIVE

## 2015-11-25 MED ORDER — PHENYLEPHRINE 40 MCG/ML (10ML) SYRINGE FOR IV PUSH (FOR BLOOD PRESSURE SUPPORT)
80.0000 ug | PREFILLED_SYRINGE | INTRAVENOUS | Status: DC | PRN
  Filled 2015-11-25: qty 5

## 2015-11-25 MED ORDER — LIDOCAINE HCL (PF) 1 % IJ SOLN
INTRAMUSCULAR | Status: DC | PRN
  Administered 2015-11-25: 2 mL
  Administered 2015-11-25: 5 mL
  Administered 2015-11-25: 3 mL

## 2015-11-25 MED ORDER — ACETAMINOPHEN 325 MG PO TABS: 650.0000 mg | ORAL_TABLET | ORAL | Status: DC | PRN

## 2015-11-25 MED ORDER — FLEET ENEMA 7-19 GM/118ML RE ENEM: 1.0000 | ENEMA | RECTAL | Status: DC | PRN

## 2015-11-25 MED ORDER — LACTATED RINGERS IV SOLN
INTRAVENOUS | Status: DC
  Administered 2015-11-25 (×2): via INTRAVENOUS

## 2015-11-25 MED ORDER — PHENYLEPHRINE 40 MCG/ML (10ML) SYRINGE FOR IV PUSH (FOR BLOOD PRESSURE SUPPORT): 80.0000 ug | PREFILLED_SYRINGE | INTRAVENOUS | Status: DC | PRN

## 2015-11-25 MED ORDER — ONDANSETRON HCL 4 MG/2ML IJ SOLN: 4.0000 mg | Freq: Four times a day (QID) | INTRAMUSCULAR | Status: DC | PRN

## 2015-11-25 MED ORDER — OXYCODONE-ACETAMINOPHEN 5-325 MG PO TABS: 2.0000 | ORAL_TABLET | ORAL | Status: DC | PRN

## 2015-11-25 MED ORDER — IBUPROFEN 600 MG PO TABS
600.0000 mg | ORAL_TABLET | Freq: Four times a day (QID) | ORAL | Status: DC
  Administered 2015-11-25 – 2015-11-27 (×9): 600 mg via ORAL
  Filled 2015-11-25 (×10): qty 1

## 2015-11-25 MED ORDER — ONDANSETRON HCL 4 MG/2ML IJ SOLN: 4.0000 mg | INTRAMUSCULAR | Status: DC | PRN

## 2015-11-25 MED ORDER — SIMETHICONE 80 MG PO CHEW: 80.0000 mg | CHEWABLE_TABLET | ORAL | Status: DC | PRN

## 2015-11-25 MED ORDER — PRENATAL MULTIVITAMIN CH
1.0000 | ORAL_TABLET | Freq: Every day | ORAL | Status: DC
  Administered 2015-11-26 – 2015-11-27 (×2): 1 via ORAL
  Filled 2015-11-25 (×3): qty 1

## 2015-11-25 MED ORDER — BENZOCAINE-MENTHOL 20-0.5 % EX AERO
1.0000 "application " | INHALATION_SPRAY | CUTANEOUS | Status: DC | PRN
  Administered 2015-11-25: 1 via TOPICAL
  Filled 2015-11-25: qty 56

## 2015-11-25 MED ORDER — PENICILLIN G POTASSIUM 5000000 UNITS IJ SOLR
2.5000 10*6.[IU] | INTRAVENOUS | Status: DC
  Administered 2015-11-25: 2.5 10*6.[IU] via INTRAVENOUS
  Filled 2015-11-25 (×3): qty 2.5

## 2015-11-25 MED ORDER — DIBUCAINE 1 % RE OINT: 1.0000 "application " | TOPICAL_OINTMENT | RECTAL | Status: DC | PRN

## 2015-11-25 MED ORDER — OXYTOCIN 40 UNITS IN LACTATED RINGERS INFUSION - SIMPLE MED
1.0000 m[IU]/min | INTRAVENOUS | Status: DC
  Administered 2015-11-25: 2 m[IU]/min via INTRAVENOUS

## 2015-11-25 MED ORDER — TERBUTALINE SULFATE 1 MG/ML IJ SOLN
0.2500 mg | Freq: Once | INTRAMUSCULAR | Status: DC | PRN
  Filled 2015-11-25: qty 1

## 2015-11-25 MED ORDER — FENTANYL 2.5 MCG/ML BUPIVACAINE 1/10 % EPIDURAL INFUSION (WH - ANES)
14.0000 mL/h | INTRAMUSCULAR | Status: DC | PRN
  Administered 2015-11-25: 14 mL/h via EPIDURAL

## 2015-11-25 MED ORDER — WITCH HAZEL-GLYCERIN EX PADS: 1.0000 "application " | MEDICATED_PAD | CUTANEOUS | Status: DC | PRN

## 2015-11-25 MED ORDER — DIPHENHYDRAMINE HCL 25 MG PO CAPS: 25.0000 mg | ORAL_CAPSULE | Freq: Four times a day (QID) | ORAL | Status: DC | PRN

## 2015-11-25 MED ORDER — EPHEDRINE 5 MG/ML INJ: 10.0000 mg | INTRAVENOUS | Status: DC | PRN

## 2015-11-25 MED ORDER — CITRIC ACID-SODIUM CITRATE 334-500 MG/5ML PO SOLN: 30.0000 mL | ORAL | Status: DC | PRN

## 2015-11-25 MED ORDER — EPHEDRINE 5 MG/ML INJ
10.0000 mg | INTRAVENOUS | Status: DC | PRN
  Filled 2015-11-25: qty 2

## 2015-11-25 MED ORDER — LACTATED RINGERS IV SOLN: 500.0000 mL | Freq: Once | INTRAVENOUS | Status: DC

## 2015-11-25 MED ORDER — ZOLPIDEM TARTRATE 5 MG PO TABS: 5.0000 mg | ORAL_TABLET | Freq: Every evening | ORAL | Status: DC | PRN

## 2015-11-25 MED ORDER — FENTANYL 2.5 MCG/ML BUPIVACAINE 1/10 % EPIDURAL INFUSION (WH - ANES)
INTRAMUSCULAR | Status: AC
  Filled 2015-11-25: qty 125

## 2015-11-25 MED ORDER — DIPHENHYDRAMINE HCL 50 MG/ML IJ SOLN: 12.5000 mg | INTRAMUSCULAR | Status: DC | PRN

## 2015-11-25 MED ORDER — LACTATED RINGERS IV SOLN
500.0000 mL | INTRAVENOUS | Status: DC | PRN
  Administered 2015-11-25: 500 mL via INTRAVENOUS

## 2015-11-25 MED ORDER — OXYTOCIN 40 UNITS IN LACTATED RINGERS INFUSION - SIMPLE MED
2.5000 [IU]/h | INTRAVENOUS | Status: DC
  Filled 2015-11-25: qty 1000

## 2015-11-25 MED ORDER — TETANUS-DIPHTH-ACELL PERTUSSIS 5-2.5-18.5 LF-MCG/0.5 IM SUSP: 0.5000 mL | Freq: Once | INTRAMUSCULAR | Status: DC

## 2015-11-25 MED ORDER — PHENYLEPHRINE 40 MCG/ML (10ML) SYRINGE FOR IV PUSH (FOR BLOOD PRESSURE SUPPORT)
PREFILLED_SYRINGE | INTRAVENOUS | Status: AC
  Filled 2015-11-25: qty 20

## 2015-11-25 MED ORDER — SENNOSIDES-DOCUSATE SODIUM 8.6-50 MG PO TABS
2.0000 | ORAL_TABLET | ORAL | Status: DC
  Administered 2015-11-25 – 2015-11-26 (×2): 2 via ORAL
  Filled 2015-11-25 (×2): qty 2

## 2015-11-25 MED ORDER — ONDANSETRON HCL 4 MG PO TABS: 4.0000 mg | ORAL_TABLET | ORAL | Status: DC | PRN

## 2015-11-25 MED ORDER — LIDOCAINE HCL (PF) 1 % IJ SOLN
30.0000 mL | INTRAMUSCULAR | Status: DC | PRN
  Filled 2015-11-25: qty 30

## 2015-11-25 MED ORDER — PENICILLIN G POTASSIUM 5000000 UNITS IJ SOLR
5.0000 10*6.[IU] | Freq: Once | INTRAVENOUS | Status: AC
  Administered 2015-11-25: 5 10*6.[IU] via INTRAVENOUS
  Filled 2015-11-25: qty 5

## 2015-11-25 MED ORDER — OXYCODONE-ACETAMINOPHEN 5-325 MG PO TABS
1.0000 | ORAL_TABLET | ORAL | Status: DC | PRN
  Administered 2015-11-25 – 2015-11-27 (×5): 1 via ORAL
  Filled 2015-11-25 (×5): qty 1

## 2015-11-25 MED ORDER — OXYTOCIN BOLUS FROM INFUSION
500.0000 mL | INTRAVENOUS | Status: DC
  Administered 2015-11-25: 500 mL via INTRAVENOUS

## 2015-11-25 MED ORDER — COCONUT OIL OIL
1.0000 "application " | TOPICAL_OIL | Status: DC | PRN
  Administered 2015-11-25: 1 via TOPICAL
  Filled 2015-11-25: qty 120

## 2015-11-25 MED ORDER — OXYCODONE-ACETAMINOPHEN 5-325 MG PO TABS: 1.0000 | ORAL_TABLET | ORAL | Status: DC | PRN

## 2015-11-25 NOTE — H&P (Signed)
Erica HarborKeisha Clements is a 25 y.o. female presenting for SOL.  She reports leakage once on L&D but unsure of when she ruptured.  Currently comfortable with epidural.  Antepartum course complicated by h/o cryo and macrosomia in last delivery (9#).  GBS postiive.   Maternal Medical History:  Reason for admission: Contractions.   Contractions: Onset was 6-12 hours ago.   Frequency: regular.   Perceived severity is moderate.    Fetal activity: Perceived fetal activity is normal.   Last perceived fetal movement was within the past hour.    Prenatal complications: no prenatal complications Prenatal Complications - Diabetes: none.    OB History    Gravida Para Term Preterm AB TAB SAB Ectopic Multiple Living   2 1 1       1      Past Medical History  Diagnosis Date  . Medical history non-contributory    Past Surgical History  Procedure Laterality Date  . No past surgeries     Family History: family history includes Cancer in her paternal grandmother; Diabetes in her paternal grandmother. Social History:  reports that she has never smoked. She has never used smokeless tobacco. She reports that she does not drink alcohol or use illicit drugs.   Prenatal Transfer Tool  Maternal Diabetes: No Genetic Screening: Normal Maternal Ultrasounds/Referrals: Normal Fetal Ultrasounds or other Referrals:  None Maternal Substance Abuse:  No Significant Maternal Medications:  None Significant Maternal Lab Results:  Lab values include: Group B Strep positive Other Comments:  None  ROS  Dilation: 4 Effacement (%): 90 Station: -2 Exam by:: Dr. Langston MaskerMorris  Blood pressure 122/90, pulse 94, temperature 98.6 F (37 C), temperature source Oral, resp. rate 18, height 5\' 5"  (1.651 m), weight 170 lb (77.111 kg), last menstrual period 02/22/2015, SpO2 100 %, not currently breastfeeding.  Ruptured forebag  Maternal Exam:  Uterine Assessment: Contraction strength is moderate.  Contraction frequency is irregular.    Abdomen: Patient reports no abdominal tenderness. Fundal height is c/w dates.   Estimated fetal weight is 8#12.   Fetal presentation: vertex  Introitus: Normal vulva. Pelvis: adequate for delivery.   Cervix: Cervix evaluated by digital exam.     Physical Exam  Constitutional: She is oriented to person, place, and time. She appears well-developed and well-nourished.  GI: Soft. There is no rebound and no guarding.  Neurological: She is alert and oriented to person, place, and time.  Skin: Skin is warm and dry.  Psychiatric: She has a normal mood and affect. Her behavior is normal.    Prenatal labs: ABO, Rh: --/--/B POS (05/23 0140) Antibody: NEG (05/23 0140) Rubella: Immune (11/10 0000) RPR: Nonreactive (11/10 0000)  HBsAg: Negative (11/10 0000)  HIV: Non-reactive (11/10 0000)  GBS: Positive (05/23 0000)   Assessment/Plan: 24yo G2P1001 at 2573w3d with labor -Add pitocin  -PCN for GBS ppx -Anticipate NSVD  Loyd Salvador 11/25/2015, 7:15 AM

## 2015-11-25 NOTE — Anesthesia Preprocedure Evaluation (Signed)
Anesthesia Evaluation  Patient identified by MRN, date of birth, ID band Patient awake    Reviewed: Allergy & Precautions, Patient's Chart, lab work & pertinent test results  Airway Mallampati: II       Dental   Pulmonary neg pulmonary ROS,    Pulmonary exam normal        Cardiovascular negative cardio ROS Normal cardiovascular exam     Neuro/Psych negative neurological ROS     GI/Hepatic negative GI ROS, Neg liver ROS,   Endo/Other  negative endocrine ROS  Renal/GU negative Renal ROS     Musculoskeletal   Abdominal   Peds  Hematology negative hematology ROS (+)   Anesthesia Other Findings   Reproductive/Obstetrics (+) Pregnancy                             Lab Results  Component Value Date   WBC 10.1 11/25/2015   HGB 10.4* 11/25/2015   HCT 31.4* 11/25/2015   MCV 85.8 11/25/2015   PLT 286 11/25/2015    Anesthesia Physical Anesthesia Plan  ASA: II  Anesthesia Plan: Epidural   Post-op Pain Management:    Induction:   Airway Management Planned: Natural Airway  Additional Equipment:   Intra-op Plan:   Post-operative Plan:   Informed Consent: I have reviewed the patients History and Physical, chart, labs and discussed the procedure including the risks, benefits and alternatives for the proposed anesthesia with the patient or authorized representative who has indicated his/her understanding and acceptance.     Plan Discussed with:   Anesthesia Plan Comments:         Anesthesia Quick Evaluation

## 2015-11-25 NOTE — Lactation Note (Signed)
This note was copied from a baby's chart. Lactation Consultation Note  Patient Name: Erica Derryl HarborKeisha Beavers ZOXWR'UToday's Date: 11/25/2015 Reason for consult: Initial assessment Baby at 8 hr of life. Mom reports bf is going well. She denies breast or nipple pain, voiced no concerns. She has a room full of visitors that she is more interested in talking to than talking with lactation. She stated she had no trouble for the 2 wk that she bf her older child and "knows how to do it". Given lactation handouts. Aware of OP services and support group.   Maternal Data Has patient been taught Hand Expression?: Yes Does the patient have breastfeeding experience prior to this delivery?: Yes  Feeding Feeding Type: Breast Fed Length of feed: 10 min  LATCH Score/Interventions Latch: Repeated attempts needed to sustain latch, nipple held in mouth throughout feeding, stimulation needed to elicit sucking reflex. (tight mouth, work on depth) Intervention(s): Adjust position;Assist with latch;Breast compression  Audible Swallowing: A few with stimulation Intervention(s): Skin to skin;Hand expression;Alternate breast massage  Type of Nipple: Everted at rest and after stimulation  Comfort (Breast/Nipple): Filling, red/small blisters or bruises, mild/mod discomfort     Hold (Positioning): Assistance needed to correctly position infant at breast and maintain latch.  LATCH Score: 6  Lactation Tools Discussed/Used WIC Program: Yes   Consult Status Consult Status: Follow-up Date: 11/26/15 Follow-up type: In-patient    Rulon Eisenmengerlizabeth E Frannie Shedrick 11/25/2015, 5:46 PM

## 2015-11-25 NOTE — Anesthesia Procedure Notes (Signed)
Epidural Patient location during procedure: OB  Staffing Anesthesiologist: Marcene DuosFITZGERALD, Shaketta Rill Performed by: anesthesiologist   Preanesthetic Checklist Completed: patient identified, site marked, surgical consent, pre-op evaluation, timeout performed, IV checked, risks and benefits discussed and monitors and equipment checked  Epidural Patient position: sitting Prep: site prepped and draped and DuraPrep Patient monitoring: continuous pulse ox and blood pressure Approach: midline Location: L4-L5 Injection technique: LOR air  Needle:  Needle type: Tuohy  Needle gauge: 17 G Needle length: 9 cm and 9 Needle insertion depth: 6 cm Catheter type: closed end flexible Catheter size: 19 Gauge Catheter at skin depth: 11 cm Test dose: negative  Assessment Events: blood not aspirated, injection not painful, no injection resistance, negative IV test and no paresthesia

## 2015-11-25 NOTE — Anesthesia Postprocedure Evaluation (Signed)
Anesthesia Post Note  Patient: Erica Clements  Procedure(s) Performed: * No procedures listed *  Patient location during evaluation: Mother Baby Anesthesia Type: Epidural Level of consciousness: awake, awake and alert, oriented and patient cooperative Pain management: pain level controlled Vital Signs Assessment: post-procedure vital signs reviewed and stable Respiratory status: spontaneous breathing, nonlabored ventilation and respiratory function stable Cardiovascular status: stable Postop Assessment: no headache, no backache, patient able to bend at knees and no signs of nausea or vomiting Anesthetic complications: no     Last Vitals:  Filed Vitals:   11/25/15 1145 11/25/15 1500  BP: 129/72 119/57  Pulse: 98 92  Temp: 36.6 C 36.7 C  Resp: 18 20    Last Pain:  Filed Vitals:   11/25/15 1500  PainSc: 6    Pain Goal:                 Zamari Bonsall L

## 2015-11-25 NOTE — Anesthesia Pain Management Evaluation Note (Signed)
  [facility] Pain Management Visit Note  Patient: Erica Clements, 25 y.o., female  "Hello I am a [facility] of the anesthesia [facility] at [facility]. We have an anesthesia team available at all times to provide care throughout the hospital, including epidural management and anesthesia for C-section. I don't know your plan for the delivery whether it a natural birth, water birth, IV sedation, nitrous supplementation, doula or epidural, but we want to meet your pain goals."   1.Was your pain managed to your expectations on prior hospitalizations?   yes  2.What is your expectation for pain management during this hospitalization?     epidural  3.How can we help you reach that goal?epidural  Record the patient's initial score and the patient's pain goal.  Pain: 10  Pain Goal: 0 [facility] wants you to be able to say your pain was always managed very well.  Erica Clements, Erica Clements 11/25/2015

## 2015-11-26 ENCOUNTER — Inpatient Hospital Stay (HOSPITAL_COMMUNITY): Admission: RE | Admit: 2015-11-26 | Payer: No Typology Code available for payment source | Source: Ambulatory Visit

## 2015-11-26 LAB — CBC
HCT: 28.7 % — ABNORMAL LOW (ref 36.0–46.0)
Hemoglobin: 9.5 g/dL — ABNORMAL LOW (ref 12.0–15.0)
MCH: 28.5 pg (ref 26.0–34.0)
MCHC: 33.1 g/dL (ref 30.0–36.0)
MCV: 86.2 fL (ref 78.0–100.0)
PLATELETS: 268 10*3/uL (ref 150–400)
RBC: 3.33 MIL/uL — ABNORMAL LOW (ref 3.87–5.11)
RDW: 14.5 % (ref 11.5–15.5)
WBC: 12.3 10*3/uL — ABNORMAL HIGH (ref 4.0–10.5)

## 2015-11-26 NOTE — Progress Notes (Signed)
Patient without complaints.  BP 111/67 mmHg  Pulse 81  Temp(Src) 98 F (36.7 C) (Oral)  Resp 18  Ht 5\' 5"  (1.651 m)  Wt 77.111 kg (170 lb)  BMI 28.29 kg/m2  SpO2 100%  LMP 02/22/2015 (Exact Date)  Breastfeeding? Unknown Results for orders placed or performed during the hospital encounter of 11/25/15 (from the past 24 hour(s))  CBC     Status: Abnormal   Collection Time: 11/26/15  5:25 AM  Result Value Ref Range   WBC 12.3 (H) 4.0 - 10.5 K/uL   RBC 3.33 (L) 3.87 - 5.11 MIL/uL   Hemoglobin 9.5 (L) 12.0 - 15.0 g/dL   HCT 16.128.7 (L) 09.636.0 - 04.546.0 %   MCV 86.2 78.0 - 100.0 fL   MCH 28.5 26.0 - 34.0 pg   MCHC 33.1 30.0 - 36.0 g/dL   RDW 40.914.5 81.111.5 - 91.415.5 %   Platelets 268 150 - 400 K/uL   Abdomen is soft and non tender Lochia WNL PPD#1 Doing well  Routine care

## 2015-11-26 NOTE — Lactation Note (Signed)
This note was copied from a baby's chart. Lactation Consultation Note  Patient Name: Boy Derryl HarborKeisha Kleve NWGNF'AToday's Date: 11/26/2015 Reason for consult: Follow-up assessment;Breast/nipple pain Visited with Mom, baby 30 hrs old.  Mom states she has pain with latch, left nipple abraded on tip, right side has dimple but no visible broken skin.  Encouraged Mom to use her EBM on nipples after breast feeding to treat soreness.  Comfort Gels to be brought in by RN to wear between feedings.  Mom has long artifical nails which make it impossible for her to break the suction before removing baby from the breast, so she has been pulling baby off and pulling nipple.  Talked about importance of breaking suction prior to unlatching baby.  Baby noted to have a short anterior lingual frenulum, leading to tongue being unable to lift in mouth.  (curling of tongue edges noted)  Assisted with positioning baby in football hold, lots of teaching done during assist.  Initial pain felt, but it subsided  Mom not wanting to support and sandwich her breast during feeding, even after explaining importance of use alternate breast compression.  No swallowing audible, or visible in suck pattern  De-latched baby from the breast after 10 minutes to check nipple.  Nipple noted to be slightly pinched on both sides.  Assisted Mom in latching baby back to breast, with little difficulty, and decreased discomfort per Mom.   Encouraged Mom to keep baby skin to skin as much as possible, so baby will cue to feed more often.  Mom gave baby a few bottles during the night.  Baby's output great. To continue to monitor how breastfeeding is going, nipple soreness, baby's output, baby's weight.  Follow up prn and in AM.       Judee ClaraSmith, Tekesha Almgren E 11/26/2015, 3:41 PM

## 2015-11-27 MED ORDER — OXYCODONE-ACETAMINOPHEN 5-325 MG PO TABS: 1.0000 | ORAL_TABLET | ORAL | Status: DC | PRN

## 2015-11-27 NOTE — Lactation Note (Signed)
This note was copied from a baby's chart. Lactation Consultation Note  Patient Name: Erica Derryl HarborKeisha Musco JXBJY'NToday's Date: 11/27/2015 Reason for consult: Follow-up assessment;Infant weight loss   Follow up with mom of 50 hour old infant. Infant with 11 BF for 10-30 minutes, 3 bottles of formula of 13-20 cc, 2 voids and 5 stools in last 24 hours. LATCH Scores 7-8 by Bedside RN. Asher MuirJamie, RN reports hearing more swallows today. Infant weight 8 lb 8.3 oz with 7% weight loss since birth.   Mom reports BF is going well. She denies nipple pain and breast feeling fuller. Mom reports she just finished feeding infant in side lying position. Infant was drowsy and laying next to mom quietly. Advosed mom to ensure head support with side lying position to encourage deep latch to maximize milk transfer.   Mom to call and make Ped f/u appt for tomorrow. She has a manual pump to take home. Enc her to call insurance company to see if they provide breast pump.  Reviewed all BF information in Taking Care of Baby and Me Booklet. Reviewed Engorgement prevention/treatment/pre pumping to soften areola, and comfort pumping with mom. Reviewed I/O and enc mom to maintain feeding log and take to Ped appt.  Reviewed LC Brochure and BF Resources Sheet. Mom aware of OP Services, BF Support Groups and LC phone #. Enc mom to call with questions/concerns prn.   Maternal Data Formula Feeding for Exclusion: No Does the patient have breastfeeding experience prior to this delivery?: Yes  Feeding Feeding Type: Breast Fed  LATCH Score/Interventions Latch: Grasps breast easily, tongue down, lips flanged, rhythmical sucking. Intervention(s): Teach feeding cues;Skin to skin Intervention(s): Adjust position;Assist with latch;Breast massage;Breast compression  Audible Swallowing: A few with stimulation Intervention(s): Hand expression  Type of Nipple: Everted at rest and after stimulation  Comfort (Breast/Nipple): Filling, red/small  blisters or bruises, mild/mod discomfort  Problem noted: Mild/Moderate discomfort Interventions  (Cracked/bleeding/bruising/blister): Hand pump;Expressed breast milk to nipple Interventions (Mild/moderate discomfort): Comfort gels;Hand expression  Hold (Positioning): Assistance needed to correctly position infant at breast and maintain latch.  LATCH Score: 7  Lactation Tools Discussed/Used WIC Program: No Pump Review: Setup, frequency, and cleaning;Milk Storage   Consult Status Consult Status: Complete Follow-up type: Call as needed    Ed BlalockSharon S Omero Kowal 11/27/2015, 11:09 AM

## 2015-11-27 NOTE — Discharge Summary (Signed)
Obstetric Discharge Summary Reason for Admission: onset of labor Prenatal Procedures: none Intrapartum Procedures: spontaneous vaginal delivery Postpartum Procedures: none Complications-Operative and Postpartum: none HEMOGLOBIN  Date Value Ref Range Status  11/26/2015 9.5* 12.0 - 15.0 g/dL Final   HCT  Date Value Ref Range Status  11/26/2015 28.7* 36.0 - 46.0 % Final    Physical Exam:  General: alert, cooperative, appears stated age and no distress Lochia: appropriate Uterine Fundus: firm Incision: healing well DVT Evaluation: No evidence of DVT seen on physical exam.  Discharge Diagnoses: Term Pregnancy-delivered  Discharge Information: Date: 11/27/2015 Activity: pelvic rest Diet: routine Medications: Percocet Condition: stable Instructions: refer to practice specific booklet Discharge to: home   Newborn Data: Live born female  Birth Weight: 9 lb 2.6 oz (4156 g) APGAR: 9, 9  Home with mother.  Erica Clements C 11/27/2015, 8:59 AM

## 2016-09-12 ENCOUNTER — Emergency Department (HOSPITAL_COMMUNITY): Payer: Managed Care, Other (non HMO) | Admitting: Critical Care Medicine

## 2016-09-12 ENCOUNTER — Encounter (HOSPITAL_COMMUNITY): Payer: Self-pay | Admitting: Radiology

## 2016-09-12 ENCOUNTER — Emergency Department (HOSPITAL_COMMUNITY): Payer: Managed Care, Other (non HMO)

## 2016-09-12 ENCOUNTER — Encounter (HOSPITAL_COMMUNITY)
Admission: EM | Disposition: A | Discharge status: Home Health | Payer: Self-pay | Source: Home / Self Care | Attending: Orthopedic Surgery

## 2016-09-12 ENCOUNTER — Inpatient Hospital Stay (HOSPITAL_COMMUNITY)
Admission: EM | Admit: 2016-09-12 | Discharge: 2016-09-15 | DRG: 482 | Disposition: A | Discharge status: Home Health | Payer: Managed Care, Other (non HMO) | Attending: Orthopedic Surgery | Admitting: Orthopedic Surgery

## 2016-09-12 DIAGNOSIS — S199XXA Unspecified injury of neck, initial encounter: Secondary | ICD-10-CM | POA: Diagnosis not present

## 2016-09-12 DIAGNOSIS — F17210 Nicotine dependence, cigarettes, uncomplicated: Secondary | ICD-10-CM | POA: Diagnosis present

## 2016-09-12 DIAGNOSIS — S0990XA Unspecified injury of head, initial encounter: Secondary | ICD-10-CM | POA: Diagnosis not present

## 2016-09-12 DIAGNOSIS — M79652 Pain in left thigh: Secondary | ICD-10-CM | POA: Diagnosis present

## 2016-09-12 DIAGNOSIS — S72452A Displaced supracondylar fracture without intracondylar extension of lower end of left femur, initial encounter for closed fracture: Secondary | ICD-10-CM | POA: Diagnosis not present

## 2016-09-12 DIAGNOSIS — T1490XA Injury, unspecified, initial encounter: Secondary | ICD-10-CM

## 2016-09-12 DIAGNOSIS — S72002A Fracture of unspecified part of neck of left femur, initial encounter for closed fracture: Secondary | ICD-10-CM | POA: Diagnosis not present

## 2016-09-12 DIAGNOSIS — S3993XA Unspecified injury of pelvis, initial encounter: Secondary | ICD-10-CM | POA: Diagnosis not present

## 2016-09-12 DIAGNOSIS — S299XXA Unspecified injury of thorax, initial encounter: Secondary | ICD-10-CM | POA: Diagnosis not present

## 2016-09-12 DIAGNOSIS — S72322A Displaced transverse fracture of shaft of left femur, initial encounter for closed fracture: Principal | ICD-10-CM

## 2016-09-12 DIAGNOSIS — Y92410 Unspecified street and highway as the place of occurrence of the external cause: Secondary | ICD-10-CM | POA: Diagnosis not present

## 2016-09-12 DIAGNOSIS — S7292XA Unspecified fracture of left femur, initial encounter for closed fracture: Secondary | ICD-10-CM

## 2016-09-12 HISTORY — PX: FEMUR IM NAIL: SHX1597

## 2016-09-12 LAB — COMPREHENSIVE METABOLIC PANEL
ALBUMIN: 3.6 g/dL (ref 3.5–5.0)
ALT: 20 U/L (ref 14–54)
AST: 23 U/L (ref 15–41)
Alkaline Phosphatase: 46 U/L (ref 38–126)
Anion gap: 11 (ref 5–15)
BUN: 6 mg/dL (ref 6–20)
CHLORIDE: 107 mmol/L (ref 101–111)
CO2: 18 mmol/L — AB (ref 22–32)
CREATININE: 0.61 mg/dL (ref 0.44–1.00)
Calcium: 8.6 mg/dL — ABNORMAL LOW (ref 8.9–10.3)
GFR calc Af Amer: >60 mL/min (ref >60)
GFR calc non Af Amer: >60 mL/min (ref >60)
GLUCOSE: 106 mg/dL — AB (ref 65–99)
Potassium: 3.2 mmol/L — ABNORMAL LOW (ref 3.5–5.1)
Sodium: 136 mmol/L (ref 135–145)
Total Bilirubin: 0.5 mg/dL (ref 0.3–1.2)
Total Protein: 7.2 g/dL (ref 6.5–8.1)

## 2016-09-12 LAB — CDS SEROLOGY

## 2016-09-12 LAB — CBC
HCT: 40.6 % (ref 36.0–46.0)
Hemoglobin: 13.4 g/dL (ref 12.0–15.0)
MCH: 29.4 pg (ref 26.0–34.0)
MCHC: 33 g/dL (ref 30.0–36.0)
MCV: 89 fL (ref 78.0–100.0)
PLATELETS: 223 10*3/uL (ref 150–400)
RBC: 4.56 MIL/uL (ref 3.87–5.11)
RDW: 12.4 % (ref 11.5–15.5)
WBC: 6.7 10*3/uL (ref 4.0–10.5)

## 2016-09-12 LAB — SAMPLE TO BLOOD BANK

## 2016-09-12 LAB — PROTIME-INR
INR: 1.03
Prothrombin Time: 13.5 seconds (ref 11.4–15.2)

## 2016-09-12 LAB — ETHANOL: Alcohol, Ethyl (B): <5 mg/dL (ref <5)

## 2016-09-12 SURGERY — INSERTION, INTRAMEDULLARY ROD, FEMUR
Anesthesia: General | Site: Leg Upper | Laterality: Left

## 2016-09-12 MED ORDER — PHENYLEPHRINE 40 MCG/ML (10ML) SYRINGE FOR IV PUSH (FOR BLOOD PRESSURE SUPPORT)
PREFILLED_SYRINGE | INTRAVENOUS | Status: DC | PRN
  Administered 2016-09-12 (×2): 40 ug via INTRAVENOUS

## 2016-09-12 MED ORDER — FENTANYL CITRATE (PF) 100 MCG/2ML IJ SOLN
INTRAMUSCULAR | Status: AC
  Filled 2016-09-12: qty 4

## 2016-09-12 MED ORDER — CEFAZOLIN SODIUM 1 G IJ SOLR
INTRAMUSCULAR | Status: DC | PRN
  Administered 2016-09-12: 2 g via INTRAMUSCULAR

## 2016-09-12 MED ORDER — DEXAMETHASONE SODIUM PHOSPHATE 10 MG/ML IJ SOLN
INTRAMUSCULAR | Status: DC | PRN
  Administered 2016-09-12: 10 mg via INTRAVENOUS

## 2016-09-12 MED ORDER — ROCURONIUM BROMIDE 50 MG/5ML IV SOSY
PREFILLED_SYRINGE | INTRAVENOUS | Status: AC
  Filled 2016-09-12: qty 5

## 2016-09-12 MED ORDER — ONDANSETRON HCL 4 MG/2ML IJ SOLN
INTRAMUSCULAR | Status: DC | PRN
  Administered 2016-09-12: 4 mg via INTRAVENOUS

## 2016-09-12 MED ORDER — SUGAMMADEX SODIUM 200 MG/2ML IV SOLN
INTRAVENOUS | Status: AC
  Filled 2016-09-12: qty 2

## 2016-09-12 MED ORDER — ROCURONIUM BROMIDE 50 MG/5ML IV SOSY
PREFILLED_SYRINGE | INTRAVENOUS | Status: DC | PRN
  Administered 2016-09-12: 30 mg via INTRAVENOUS

## 2016-09-12 MED ORDER — ONDANSETRON HCL 4 MG/2ML IJ SOLN
4.0000 mg | Freq: Once | INTRAMUSCULAR | Status: AC
  Administered 2016-09-12: 4 mg via INTRAVENOUS

## 2016-09-12 MED ORDER — MIDAZOLAM HCL 5 MG/5ML IJ SOLN
INTRAMUSCULAR | Status: DC | PRN
  Administered 2016-09-12: 2 mg via INTRAVENOUS

## 2016-09-12 MED ORDER — 0.9 % SODIUM CHLORIDE (POUR BTL) OPTIME
TOPICAL | Status: DC | PRN
  Administered 2016-09-12: 1000 mL

## 2016-09-12 MED ORDER — MORPHINE SULFATE (PF) 4 MG/ML IV SOLN
4.0000 mg | Freq: Once | INTRAVENOUS | Status: AC
  Administered 2016-09-12: 4 mg via INTRAVENOUS

## 2016-09-12 MED ORDER — SUCCINYLCHOLINE CHLORIDE 200 MG/10ML IV SOSY
PREFILLED_SYRINGE | INTRAVENOUS | Status: AC
  Filled 2016-09-12: qty 10

## 2016-09-12 MED ORDER — FENTANYL CITRATE (PF) 100 MCG/2ML IJ SOLN
INTRAMUSCULAR | Status: AC
  Administered 2016-09-12: 50 ug via INTRAVENOUS
  Filled 2016-09-12: qty 2

## 2016-09-12 MED ORDER — ONDANSETRON HCL 4 MG PO TABS: 4.0000 mg | ORAL_TABLET | Freq: Four times a day (QID) | ORAL | Status: DC | PRN

## 2016-09-12 MED ORDER — DEXAMETHASONE SODIUM PHOSPHATE 10 MG/ML IJ SOLN
INTRAMUSCULAR | Status: AC
  Filled 2016-09-12: qty 1

## 2016-09-12 MED ORDER — LIDOCAINE 2% (20 MG/ML) 5 ML SYRINGE
INTRAMUSCULAR | Status: DC | PRN
  Administered 2016-09-12: 40 mg via INTRAVENOUS

## 2016-09-12 MED ORDER — ONDANSETRON HCL 4 MG/2ML IJ SOLN
INTRAMUSCULAR | Status: AC
  Filled 2016-09-12: qty 2

## 2016-09-12 MED ORDER — FENTANYL CITRATE (PF) 100 MCG/2ML IJ SOLN
25.0000 ug | INTRAMUSCULAR | Status: DC | PRN
  Administered 2016-09-12 (×2): 50 ug via INTRAVENOUS

## 2016-09-12 MED ORDER — BISACODYL 10 MG RE SUPP: 10.0000 mg | Freq: Every day | RECTAL | Status: DC | PRN

## 2016-09-12 MED ORDER — OXYCODONE HCL 5 MG PO TABS
5.0000 mg | ORAL_TABLET | ORAL | Status: DC | PRN
  Administered 2016-09-12 – 2016-09-15 (×8): 10 mg via ORAL
  Filled 2016-09-12 (×10): qty 2

## 2016-09-12 MED ORDER — KETOROLAC TROMETHAMINE 15 MG/ML IJ SOLN
15.0000 mg | Freq: Four times a day (QID) | INTRAMUSCULAR | Status: AC
  Administered 2016-09-12 – 2016-09-13 (×4): 15 mg via INTRAVENOUS
  Filled 2016-09-12 (×4): qty 1

## 2016-09-12 MED ORDER — METOCLOPRAMIDE HCL 5 MG PO TABS
5.0000 mg | ORAL_TABLET | Freq: Three times a day (TID) | ORAL | Status: DC | PRN
  Administered 2016-09-15: 10 mg via ORAL
  Filled 2016-09-12: qty 2

## 2016-09-12 MED ORDER — OXYCODONE HCL 5 MG/5ML PO SOLN: 5.0000 mg | Freq: Once | ORAL | Status: DC | PRN

## 2016-09-12 MED ORDER — MIDAZOLAM HCL 2 MG/2ML IJ SOLN
INTRAMUSCULAR | Status: AC
  Filled 2016-09-12: qty 2

## 2016-09-12 MED ORDER — PROPOFOL 10 MG/ML IV BOLUS
INTRAVENOUS | Status: DC | PRN
  Administered 2016-09-12: 150 mg via INTRAVENOUS

## 2016-09-12 MED ORDER — FENTANYL CITRATE (PF) 100 MCG/2ML IJ SOLN
INTRAMUSCULAR | Status: DC | PRN
  Administered 2016-09-12 (×2): 50 ug via INTRAVENOUS

## 2016-09-12 MED ORDER — ONDANSETRON HCL 4 MG/2ML IJ SOLN
4.0000 mg | Freq: Four times a day (QID) | INTRAMUSCULAR | Status: DC | PRN
  Administered 2016-09-13: 4 mg via INTRAVENOUS
  Filled 2016-09-12: qty 2

## 2016-09-12 MED ORDER — ASPIRIN EC 325 MG PO TBEC
325.0000 mg | DELAYED_RELEASE_TABLET | Freq: Every day | ORAL | Status: DC
  Administered 2016-09-12 – 2016-09-15 (×4): 325 mg via ORAL
  Filled 2016-09-12 (×4): qty 1

## 2016-09-12 MED ORDER — METHOCARBAMOL 500 MG PO TABS
500.0000 mg | ORAL_TABLET | Freq: Four times a day (QID) | ORAL | Status: DC | PRN
  Administered 2016-09-13 – 2016-09-15 (×4): 500 mg via ORAL
  Filled 2016-09-12 (×4): qty 1

## 2016-09-12 MED ORDER — POLYETHYLENE GLYCOL 3350 17 G PO PACK: 17.0000 g | PACK | Freq: Every day | ORAL | Status: DC | PRN

## 2016-09-12 MED ORDER — MORPHINE SULFATE (PF) 4 MG/ML IV SOLN
INTRAVENOUS | Status: AC
  Filled 2016-09-12: qty 1

## 2016-09-12 MED ORDER — HYDROMORPHONE HCL 2 MG/ML IJ SOLN
1.0000 mg | Freq: Once | INTRAMUSCULAR | Status: AC
  Administered 2016-09-12: 1 mg via INTRAVENOUS
  Filled 2016-09-12: qty 1

## 2016-09-12 MED ORDER — ONDANSETRON HCL 4 MG/2ML IJ SOLN
4.0000 mg | Freq: Once | INTRAMUSCULAR | Status: DC | PRN
Start: 2016-09-12 — End: 2016-09-12

## 2016-09-12 MED ORDER — ACETAMINOPHEN 325 MG PO TABS
650.0000 mg | ORAL_TABLET | Freq: Four times a day (QID) | ORAL | Status: DC | PRN
  Administered 2016-09-13 – 2016-09-14 (×5): 650 mg via ORAL
  Filled 2016-09-12 (×5): qty 2

## 2016-09-12 MED ORDER — PROPOFOL 10 MG/ML IV BOLUS
INTRAVENOUS | Status: AC
  Filled 2016-09-12: qty 20

## 2016-09-12 MED ORDER — IOPAMIDOL (ISOVUE-300) INJECTION 61%
INTRAVENOUS | Status: AC
  Administered 2016-09-12: 100 mL
  Filled 2016-09-12: qty 100

## 2016-09-12 MED ORDER — SODIUM CHLORIDE 0.9 % IV SOLN
INTRAVENOUS | Status: DC
  Administered 2016-09-12: 12:00:00 via INTRAVENOUS

## 2016-09-12 MED ORDER — MAGNESIUM CITRATE PO SOLN: 1.0000 | Freq: Once | ORAL | Status: DC | PRN

## 2016-09-12 MED ORDER — LACTATED RINGERS IV SOLN
INTRAVENOUS | Status: DC | PRN
  Administered 2016-09-12 (×2): via INTRAVENOUS

## 2016-09-12 MED ORDER — ACETAMINOPHEN 650 MG RE SUPP: 650.0000 mg | Freq: Four times a day (QID) | RECTAL | Status: DC | PRN

## 2016-09-12 MED ORDER — METHOCARBAMOL 1000 MG/10ML IJ SOLN
500.0000 mg | Freq: Four times a day (QID) | INTRAVENOUS | Status: DC | PRN
  Filled 2016-09-12: qty 5

## 2016-09-12 MED ORDER — ALBUMIN HUMAN 5 % IV SOLN
INTRAVENOUS | Status: DC | PRN
  Administered 2016-09-12: 09:00:00 via INTRAVENOUS

## 2016-09-12 MED ORDER — CEFAZOLIN IN D5W 1 GM/50ML IV SOLN
1.0000 g | Freq: Four times a day (QID) | INTRAVENOUS | Status: AC
  Administered 2016-09-12 – 2016-09-13 (×3): 1 g via INTRAVENOUS
  Filled 2016-09-12 (×3): qty 50

## 2016-09-12 MED ORDER — DOCUSATE SODIUM 100 MG PO CAPS
100.0000 mg | ORAL_CAPSULE | Freq: Two times a day (BID) | ORAL | Status: DC
  Administered 2016-09-12 – 2016-09-15 (×6): 100 mg via ORAL
  Filled 2016-09-12 (×7): qty 1

## 2016-09-12 MED ORDER — SUCCINYLCHOLINE CHLORIDE 200 MG/10ML IV SOSY
PREFILLED_SYRINGE | INTRAVENOUS | Status: DC | PRN
  Administered 2016-09-12: 140 mg via INTRAVENOUS

## 2016-09-12 MED ORDER — LIDOCAINE 2% (20 MG/ML) 5 ML SYRINGE
INTRAMUSCULAR | Status: AC
  Filled 2016-09-12: qty 5

## 2016-09-12 MED ORDER — METOCLOPRAMIDE HCL 5 MG/ML IJ SOLN: 5.0000 mg | Freq: Three times a day (TID) | INTRAMUSCULAR | Status: DC | PRN

## 2016-09-12 MED ORDER — OXYCODONE HCL 5 MG PO TABS: 5.0000 mg | ORAL_TABLET | Freq: Once | ORAL | Status: DC | PRN

## 2016-09-12 MED ORDER — HYDROMORPHONE HCL 2 MG/ML IJ SOLN
1.0000 mg | INTRAMUSCULAR | Status: DC | PRN
  Administered 2016-09-12 – 2016-09-15 (×10): 1 mg via INTRAVENOUS
  Filled 2016-09-12 (×11): qty 1

## 2016-09-12 SURGICAL SUPPLY — 39 items
BIT DRILL 4.2 (DRILL) ×1 IMPLANT
BIT DRILL FLUTED FEMUR 4.2/3 (BIT) ×2 IMPLANT
BLADE SURG 15 STRL LF DISP TIS (BLADE) ×1 IMPLANT
BLADE SURG 15 STRL SS (BLADE)
COVER BACK TABLE 60X90IN (DRAPES) ×1 IMPLANT
COVER MAYO STAND STRL (DRAPES) ×3 IMPLANT
COVER PERINEAL POST (MISCELLANEOUS) ×3 IMPLANT
COVER SURGICAL LIGHT HANDLE (MISCELLANEOUS) ×6 IMPLANT
DRAPE C-ARM 42X72 X-RAY (DRAPES) ×3 IMPLANT
DRAPE STERI IOBAN 125X83 (DRAPES) ×3 IMPLANT
DRILL 4.2 (DRILL) ×3
DRSG ADAPTIC 3X8 NADH LF (GAUZE/BANDAGES/DRESSINGS) IMPLANT
DRSG MEPILEX BORDER 4X4 (GAUZE/BANDAGES/DRESSINGS) ×3 IMPLANT
DRSG MEPILEX BORDER 4X8 (GAUZE/BANDAGES/DRESSINGS) ×3 IMPLANT
ELECT REM PT RETURN 9FT ADLT (ELECTROSURGICAL) ×3
ELECTRODE REM PT RTRN 9FT ADLT (ELECTROSURGICAL) ×1 IMPLANT
EVACUATOR 1/8 PVC DRAIN (DRAIN) IMPLANT
GLOVE BIOGEL PI IND STRL 9 (GLOVE) ×1 IMPLANT
GLOVE BIOGEL PI INDICATOR 9 (GLOVE) ×2
GLOVE SURG ORTHO 9.0 STRL STRW (GLOVE) ×3 IMPLANT
GOWN STRL REUS W/ TWL XL LVL3 (GOWN DISPOSABLE) ×2 IMPLANT
GOWN STRL REUS W/TWL XL LVL3 (GOWN DISPOSABLE) ×6
GUIDEWIRE 3.2X400 (WIRE) ×3 IMPLANT
KIT BASIN OR (CUSTOM PROCEDURE TRAY) ×3 IMPLANT
KIT ROOM TURNOVER OR (KITS) ×3 IMPLANT
LINER BOOT UNIVERSAL DISP (MISCELLANEOUS) IMPLANT
MANIFOLD NEPTUNE II (INSTRUMENTS) ×3 IMPLANT
NAIL FEM 9X400 LT (Nail) ×3 IMPLANT
NAIL FEM 9X420 LT (Nail) ×2 IMPLANT
NS IRRIG 1000ML POUR BTL (IV SOLUTION) ×3 IMPLANT
PACK GENERAL/GYN (CUSTOM PROCEDURE TRAY) ×3 IMPLANT
PAD ARMBOARD 7.5X6 YLW CONV (MISCELLANEOUS) ×6 IMPLANT
REAMER ROD DEEP FLUTE 2.5X950 (INSTRUMENTS) ×2 IMPLANT
SCREW CANN LOCK FT STRDR 5X70 (Screw) ×3 IMPLANT
SCREW CANN LOCK TI FT 5X42 (Screw) ×2 IMPLANT
SCREW LOCKING 5.0MX50M (Screw) ×3 IMPLANT
STAPLER VISISTAT 35W (STAPLE) ×2 IMPLANT
SUT VIC AB 2-0 CTB1 (SUTURE) ×2 IMPLANT
WATER STERILE IRR 1000ML POUR (IV SOLUTION) ×6 IMPLANT

## 2016-09-12 NOTE — ED Notes (Signed)
Ortho techs at bedside to place pt in bucks traction

## 2016-09-12 NOTE — Anesthesia Postprocedure Evaluation (Addendum)
Anesthesia Post Note  Patient: Erica HarborKeisha Wadas  Procedure(s) Performed: Procedure(s) (LRB): INTRAMEDULLARY (IM) NAIL FEMORAL (Left)  Patient location during evaluation: PACU Anesthesia Type: General Level of consciousness: awake, oriented, sedated and patient cooperative Pain management: pain level controlled Vital Signs Assessment: post-procedure vital signs reviewed and stable Respiratory status: spontaneous breathing, nonlabored ventilation and respiratory function stable Cardiovascular status: blood pressure returned to baseline Anesthetic complications: no       Last Vitals:  Vitals:   09/12/16 1110 09/12/16 1118  BP:  115/90  Pulse:  (!) 104  Resp:  15  Temp: 36.5 C     Last Pain:  Vitals:   09/12/16 0701  TempSrc:   PainSc: 10-Worst pain ever                 Nico Syme COKER

## 2016-09-12 NOTE — Op Note (Signed)
09/12/2016  10:19 AM  PATIENT:  Erica HarborKeisha Doolen    PRE-OPERATIVE DIAGNOSIS:  left femur fracture with supracondylar transverse fracture and midshaft transverse fracture.  POST-OPERATIVE DIAGNOSIS:  Same  PROCEDURE:  INTRAMEDULLARY (IM) NAIL FEMORAL to treat both the supracondylar femur fracture and midshaft diaphyseal fracture. C-arm fluoroscopy  SURGEON:  Nadara MustardMarcus V Tsutomu Barfoot, MD  PHYSICIAN ASSISTANT:None ANESTHESIA:   General  PREOPERATIVE INDICATIONS:  Erica HarborKeisha Blickenstaff is a  26 y.o. female with a diagnosis of left femur fracture who failed conservative measures and elected for surgical management.    The risks benefits and alternatives were discussed with the patient preoperatively including but not limited to the risks of infection, bleeding, nerve injury, cardiopulmonary complications, the need for revision surgery, among others, and the patient was willing to proceed.  OPERATIVE IMPLANTS: Synthes antegrade trochanteric nail 10 mm in diameter 420 mm long locked proximally 1 distally 2  OPERATIVE FINDINGS: Postoperative radiographs show stable alignment no evidence of femoral neck fracture.  OPERATIVE PROCEDURE: Patient brought the operating room and underwent a general anesthetic. After adequate levels anesthesia obtained [facility]'s [facility] the left lower extremity was placed in boot traction the right lower extremity was padded and secured to the crossbar of the bed. Patient was prepped using DuraPrep draped in a sterile field with a shower curtain. A timeout was called. An incision was made just proximal to the greater trochanter guidewire was inserted through the greater trochanter down the shaft this was overdrilled a reduction tool was placed the fracture reduced and a guidewire inserted down the femoral canal. C-arm fluoroscopy verified alignment. This was reamed to 10.5 mm for a 9 mm nail. A measurement showed that a 40 cm nail was sufficient however after  inserting the 40 cm nail this was too short this was removed and a 42 cm nail was inserted. This was locked proximally obliquely and 2 distal locking screws were placed to stabilize the supracondylar femur fracture. The fracture site was impacted to reduce it prior to insertion of the distal screws. C-arm floss be was utilized to verify reduction of the 2 fractures the hardware had no complicating features. The wounds were irrigated with normal saline the subcutaneous is closed using 2-0 Vicryl the skin was closed using staples and Mepilex dressings were applied patient was extubated taken to the PACU in stable condition.

## 2016-09-12 NOTE — ED Notes (Signed)
Pt unable to use female urinal or bedpan.

## 2016-09-12 NOTE — Transfer of Care (Signed)
Immediate Anesthesia Transfer of Care Note  Patient: Erica Clements  Procedure(s) Performed: Procedure(s): INTRAMEDULLARY (IM) NAIL FEMORAL (Left)  Patient Location: PACU  Anesthesia Type:General  Level of Consciousness: awake, alert  and oriented  Airway & Oxygen Therapy: Patient Spontanous Breathing and Patient connected to nasal cannula oxygen  Post-op Assessment: Report given to RN, Post -op Vital signs reviewed and stable and Patient moving all extremities X 4  Post vital signs: Reviewed and stable  Last Vitals:  Vitals:   09/12/16 0700 09/12/16 0750  BP: 121/69   Pulse: 108 105  Resp: 16 15  Temp:    HR 112, BP 113/68, RR 14, SAts 100%  Last Pain:  Vitals:   09/12/16 0701  TempSrc:   PainSc: 10-Worst pain ever         Complications: No apparent anesthesia complications13

## 2016-09-12 NOTE — ED Notes (Signed)
Pt transported to CT with RN and EMT; pt transferred to CT table with 4 staff members with one supporting the LLE

## 2016-09-12 NOTE — ED Notes (Signed)
Pt repeatedly asking when she can go home; this RN informed her that she may be here for couple days
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
## 2016-09-12 NOTE — Anesthesia Preprocedure Evaluation (Signed)
Anesthesia Evaluation  Patient identified by MRN, date of birth, ID band Patient awake    Reviewed: Allergy & Precautions, NPO status , Patient's Chart, lab work & pertinent test results  Airway Mallampati: II  TM Distance: >3 FB Neck ROM: Full    Dental  (+) Teeth Intact   Pulmonary    breath sounds clear to auscultation       Cardiovascular  Rhythm:Regular Rate:Normal     Neuro/Psych    GI/Hepatic   Endo/Other    Renal/GU      Musculoskeletal   Abdominal   Peds  Hematology   Anesthesia Other Findings   Reproductive/Obstetrics                             Anesthesia Physical Anesthesia Plan  ASA: II  Anesthesia Plan: General   Post-op Pain Management:    Induction: Intravenous  Airway Management Planned: Oral ETT  Additional Equipment:   Intra-op Plan:   Post-operative Plan: Extubation in OR  Informed Consent: I have reviewed the patients History and Physical, chart, labs and discussed the procedure including the risks, benefits and alternatives for the proposed anesthesia with the patient or authorized representative who has indicated his/her understanding and acceptance.     Plan Discussed with: CRNA and Anesthesiologist  Anesthesia Plan Comments:         Anesthesia Quick Evaluation

## 2016-09-12 NOTE — Progress Notes (Signed)
Orthopedic Tech Progress Note Patient Details:  Erica HarborKeisha Clements Jul 13, 1990 [id number]  Musculoskeletal Traction Type of Traction: Bucks Skin Traction Traction Location: lle Traction Weight: 10 lbs    [name], [name] 09/12/2016, 7:20 AM
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
## 2016-09-12 NOTE — Progress Notes (Signed)
Paged MD Lajoyce Cornersuda for patient's bladder scan of 740 ml, I initially got no response, so I called the answering service to have them put out a page to on call. MD Lajoyce Cornersuda did call back and I notified him this patient already had to have an in and out twice, he said put in an indwelling foley.

## 2016-09-12 NOTE — Progress Notes (Signed)
Patient transferred from PACU to 6n15, alert and oriented but very drowsy, mild pain, IV fluids running. Received report at bedside from PACU nurse. Patient oriented to room and staff, no complaints at this moment, no family at bedside yet. Will continue to monitor.

## 2016-09-12 NOTE — Anesthesia Procedure Notes (Signed)
Procedure Name: Intubation Date/Time: 09/12/2016 8:52 AM Performed by: Merrilyn Puma B Pre-anesthesia Checklist: Patient identified, Emergency Drugs available, Suction available, Patient being monitored and Timeout performed Patient Re-evaluated:Patient Re-evaluated prior to inductionOxygen Delivery Method: Circle system utilized Preoxygenation: Pre-oxygenation with 100% oxygen Intubation Type: IV induction, Cricoid Pressure applied and Rapid sequence Laryngoscope Size: Mac and 3 Grade View: Grade I Tube type: Oral Tube size: 7.0 mm Number of attempts: 1 Airway Equipment and Method: Stylet Placement Confirmation: ETT inserted through vocal cords under direct vision,  positive ETCO2,  CO2 detector and breath sounds checked- equal and bilateral Secured at: 21 cm Tube secured with: Tape Dental Injury: Teeth and Oropharynx as per pre-operative assessment
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
## 2016-09-12 NOTE — ED Notes (Signed)
pts mother called per pt request- informed of patients location; states she is on her way

## 2016-09-12 NOTE — Progress Notes (Signed)
PT Cancellation Note  Patient Details Name: Erica Clements MRN: [id number] DOB: 08-16-1990   Cancelled Treatment:    Reason Eval/Treat Not Completed: Medical issues which prohibited therapy. Per RN pt has not been voiding her bladder and needs to be catheterized. Requesting PT hold until tomorrow.    Erica Clements PT, DPT  [phone]  09/12/2016, 3:00 PM

## 2016-09-13 ENCOUNTER — Encounter (HOSPITAL_COMMUNITY): Payer: Self-pay | Admitting: Orthopedic Surgery

## 2016-09-13 LAB — I-STAT BETA HCG BLOOD, ED (NOT ORDERABLE)

## 2016-09-13 LAB — CG4 I-STAT (LACTIC ACID): Lactic Acid, Venous: 2.75 mmol/L (ref 0.5–1.9)

## 2016-09-13 NOTE — Care Management Note (Addendum)
Case Management Note  Patient Details  Name: Erica HarborKeisha Cohea MRN: [id number] Date of Birth: 05/10/1991  Subjective/Objective:                    Action/Plan: Discussed discharge planning with patient and boyfriend and multiple friends/family at bedside. Facesheet information is her mother's address. She and boyfriend live at : [address] , [place], [place] [address], phone [phone]. Confirmed patient has [employer] insurance. 3 in 1 and [facility]  Called [employer] to arrange HHPT. All information given and faxed to [employer] at [employer]. [employer] 1 [phone], patient's intake number is [phone], [employer] fax number is [phone]. [employer] will arrange HHPT and call patient directly with home health agency name. Patient and Dr Lajoyce Cornersuda both aware and both voiced understanding.  Patient has stairs at home. Placed order for PT to work with patient on stairs. Expected Discharge Date:                  Expected Discharge Plan:  Home w Home Health Services  In-House Referral:     Discharge planning Services  CM Consult  Post Acute Care Choice:  Home Health, Durable Medical Equipment Choice offered to:  Patient  DME Arranged:  3-N-1, Walker rolling DME Agency:  [facility]  [facility]     Status of Service:  In process, will continue to follow  If discussed at Long Length of Stay Meetings, dates discussed:    Additional Comments:  [name], Londen Lorge Marie, RN 09/13/2016, 2:01 PM
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
## 2016-09-13 NOTE — Care Management Note (Signed)
Case Management Note  Patient Details  Name: Erica Clements MRN: [id number] Date of Birth: 1991/05/04  Subjective/Objective:                    Action/Plan:  Await PT eval Expected Discharge Date:                  Expected Discharge Plan:     In-House Referral:     Discharge planning Services  CM Consult  Post Acute Care Choice:  Home Health, Durable Medical Equipment Choice offered to:     DME Arranged:    DME Agency:     HH Arranged:    HH Agency:     Status of Service:  In process, will continue to follow  If discussed at Long Length of Stay Meetings, dates discussed:    Additional Comments:  [name], [name], RN 09/13/2016, 11:01 AM

## 2016-09-13 NOTE — Progress Notes (Signed)
Discontinued foley cath

## 2016-09-13 NOTE — Progress Notes (Signed)
Patient ID: Erica HarborKeisha Clements, female   DOB: 11/09/1990, 26 y.o.   MRN: [id number] Patient complains of pain when her pain medicine wears off. The dressings have small amount of drainage. Plan to discontinue Foley catheter today. Plan for physical therapy touchdown weightbearing on the left. Anticipate discharge home on Tuesday.

## 2016-09-14 MED ORDER — KETOROLAC TROMETHAMINE 15 MG/ML IJ SOLN
15.0000 mg | Freq: Four times a day (QID) | INTRAMUSCULAR | Status: DC
  Administered 2016-09-14 – 2016-09-15 (×4): 15 mg via INTRAVENOUS
  Filled 2016-09-14 (×4): qty 1
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
## 2016-09-14 NOTE — Progress Notes (Signed)
Patient ID: Erica Clements, female   DOB: [birth year], 26 y.o.   MRN: [id number] Patient reports difficulty mobilizing. Plan for discharge to home tomorrow Wednesday.

## 2016-09-14 NOTE — Progress Notes (Signed)
RN paged MD due to patient having a migraine. Awaiting a return call.

## 2016-09-14 NOTE — Discharge Instructions (Signed)
[facility], [facility] services( [employer]) will arrange your home health physical therapy . [facility] will call you directly with the name of your home health agency.  Any questions regarding home health please call [facility] at [phone], your intake number is [phone] .

## 2016-09-15 ENCOUNTER — Encounter (HOSPITAL_COMMUNITY): Payer: Self-pay | Admitting: *Deleted

## 2016-09-15 MED ORDER — OXYCODONE-ACETAMINOPHEN 5-325 MG PO TABS: 1.0000 | ORAL_TABLET | ORAL | 0 refills | Status: DC | PRN

## 2016-09-15 NOTE — Progress Notes (Signed)
Pt discharged home in stable condition. Mom provided transportation. Went over discharge instructions and meds with no concerns voiced. Home needs provided before discharge. AVS and discharge script given to pt

## 2016-09-15 NOTE — Care Management Note (Signed)
Case Management Note  Patient Details  Name: Erica HarborKeisha Clowdus MRN: [id number] Date of Birth: 09/30/1990  Subjective/Objective:                    Action/Plan: Discussed discharge planning with patient and boyfriend and multiple friends/family at bedside. Facesheet information is her mother's address. She and boyfriend live at : [address] , [place], [place] [address], phone [phone]. Confirmed patient has [employer] insurance. 3 in 1 and [facility] .  Called [employer] to arrange HHPT. All information given and faxed to [employer] [employer] [facility] [employer]. [employer] 1 [phone], patient's intake number is [phone], [employer] fax number is [phone]. [facility] will arrange HHPT and call patient directly with home health agency name. Patient and Dr Lajoyce Cornersuda both aware and both voiced understanding.  Patient has stairs at home. Placed order for PT to work with patient on stairs. Expected Discharge Date:  09/15/16               Expected Discharge Plan:  Home w Home Health Services  In-House Referral:     Discharge planning Services  CM Consult  Post Acute Care Choice:  Home Health, Durable Medical Equipment Choice offered to:  Patient  DME Arranged:  3-N-1, Walker rolling DME Agency:  [facility]  [facility]     Status of Service:  In process, will continue to follow  If discussed at Long Length of Stay Meetings, dates discussed:    Additional Comments: CM contacted by [facility]- HH has been secured by [facility] and will begin tomorrow 3/15/18Claxton, Manfred ArchSamantha S, RN 09/15/2016, 2:46 PM
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
## 2016-09-17 ENCOUNTER — Telehealth (INDEPENDENT_AMBULATORY_CARE_PROVIDER_SITE_OTHER): Payer: Self-pay | Admitting: Orthopedic Surgery

## 2016-09-17 NOTE — Telephone Encounter (Signed)
Patient called stating that the pain medicine Dr. Lajoyce Cornersuda prescribed is not helping with the pain.  CB#[phone].  Thank you.

## 2016-09-17 NOTE — Telephone Encounter (Signed)
09/12/16 pt s/p IM nail femur fracture and was given rx for percocet 5/325 # 60 on 09/15/16 pt is stating that the pain medication is not working. Please advise ? Change dose instructions and add aleve bid?

## 2016-09-17 NOTE — Telephone Encounter (Signed)
Yes needs to take ibuprofen in between, can double up on percocet if absolutely necessary

## 2016-09-22 ENCOUNTER — Ambulatory Visit (INDEPENDENT_AMBULATORY_CARE_PROVIDER_SITE_OTHER): Payer: Managed Care, Other (non HMO)

## 2016-09-22 ENCOUNTER — Ambulatory Visit (INDEPENDENT_AMBULATORY_CARE_PROVIDER_SITE_OTHER): Payer: Medicaid Other | Admitting: Orthopedic Surgery

## 2016-09-22 ENCOUNTER — Encounter (INDEPENDENT_AMBULATORY_CARE_PROVIDER_SITE_OTHER): Payer: Self-pay | Admitting: Orthopedic Surgery

## 2016-09-22 VITALS — Ht 65.0 in | Wt 160.0 lb

## 2016-09-22 DIAGNOSIS — S72322D Displaced transverse fracture of shaft of left femur, subsequent encounter for closed fracture with routine healing: Secondary | ICD-10-CM

## 2016-09-22 DIAGNOSIS — M79605 Pain in left leg: Secondary | ICD-10-CM

## 2016-09-22 DIAGNOSIS — S72452D Displaced supracondylar fracture without intracondylar extension of lower end of left femur, subsequent encounter for closed fracture with routine healing: Secondary | ICD-10-CM

## 2016-09-22 NOTE — Progress Notes (Signed)
Office Visit Note   Patient: Erica Clements           Date of Birth: 04-Sep-1990           MRN: [id number] Visit Date: 09/22/2016              Requested by: No referring provider defined for this encounter. PCP: No PCP Per Patient  Chief Complaint  Patient presents with  . Left Leg - Routine Post Op    09/12/16 left femur IM nail    HPI: Patient is a 26 year old woman who is about a week and half status post IM nailing for a left femur fracture. Has been nonweightbearing with a walker.    Assessment & Plan: Visit Diagnoses:  1. Pain in left leg   2. Closed displaced transverse fracture of shaft of left femur with routine healing, subsequent encounter   3. Closed supracondylar fracture of left femur with routine healing, subsequent encounter     Plan: Follow-up in 2 more weeks with radiographs. Staples harvested. Continue nonweightbearing with walker for 2 more weeks. Cleanse incisions daily. Apply dry dressings.   Follow-Up Instructions: Return in about 2 weeks (around 10/06/2016).   Ortho Exam Physical Exam  Constitutional: Appears well-developed.  Head: Normocephalic.  Eyes: EOM are normal.  Neck: Normal range of motion.  Cardiovascular: Normal rate.   Pulmonary/Chest: Effort normal.  Neurological: Is alert.  Skin: Skin is warm.  Psychiatric: Has a normal mood and affect. Incisions well approximated with sutures. No drainage. No erythema. No gaping. Will harvest staples today.  Imaging: Xr Femur Min 2 Views Left  Result Date: 09/22/2016 Radiographs of the left femur show table alignment of hardware. There is no complicating features. No hardware failure.   Labs: Lab Results  Component Value Date   REPTSTATUS 09/22/2015 FINAL 09/20/2015   CULT  09/20/2015    MULTIPLE SPECIES PRESENT, SUGGEST RECOLLECTION NO GROUP B STREP (S.AGALACTIAE) ISOLATED Performed at [facility]     Orders:  Orders Placed This Encounter  Procedures  . XR FEMUR MIN 2 VIEWS  LEFT   No orders of the defined types were placed in this encounter.    Procedures: No procedures performed  Clinical Data: No additional findings.  Subjective: Review of Systems  Constitutional: Negative for chills and fever.    Objective: Vital Signs: Ht 5\' 5"  (1.651 m)   Wt 160 lb (72.6 kg)   LMP  (LMP Unknown) Comment: Pregnancy test done with ISTAT. Pregnancy test negative according to ISTAT.  BMI 26.63 kg/m   Specialty Comments:  No specialty comments available.  PMFS History: Patient Active Problem List   Diagnosis Date Noted  . Closed displaced transverse fracture of shaft of left femur (HCC) 09/12/2016  . Closed supracondylar fracture of left femur (HCC)   . Vaginal delivery 11/27/2015  . Normal labor 11/25/2015  . Preterm labor 10/12/2015  . Preterm contractions 10/11/2015  . Pregnancy 02/01/2014  . Active labor 01/19/2014   Past Medical History:  Diagnosis Date  . Medical history non-contributory     Family History  Problem Relation Age of Onset  . Cancer Paternal Grandmother   . Diabetes Paternal Grandmother     Past Surgical History:  Procedure Laterality Date  . FEMUR IM NAIL Left 09/12/2016   Procedure: INTRAMEDULLARY (IM) NAIL FEMORAL;  Surgeon: Nadara Mustard, MD;  Location: [facility] OR;  Service: Orthopedics;  Laterality: Left;  . NO PAST SURGERIES     Social History   Occupational  History  . Not on file.   Social History Main Topics  . Smoking status: Unknown If Ever Smoked  . Smokeless tobacco: Never Used  . Alcohol use No     Comment: Socially  . Drug use: No  . Sexual activity: Yes

## 2016-10-06 ENCOUNTER — Ambulatory Visit (INDEPENDENT_AMBULATORY_CARE_PROVIDER_SITE_OTHER): Payer: Medicaid Other | Admitting: Orthopedic Surgery

## 2016-10-11 ENCOUNTER — Ambulatory Visit (INDEPENDENT_AMBULATORY_CARE_PROVIDER_SITE_OTHER): Payer: Managed Care, Other (non HMO)

## 2016-10-11 ENCOUNTER — Encounter (INDEPENDENT_AMBULATORY_CARE_PROVIDER_SITE_OTHER): Payer: Self-pay | Admitting: Orthopedic Surgery

## 2016-10-11 ENCOUNTER — Telehealth (INDEPENDENT_AMBULATORY_CARE_PROVIDER_SITE_OTHER): Payer: Self-pay | Admitting: Orthopedic Surgery

## 2016-10-11 ENCOUNTER — Ambulatory Visit (INDEPENDENT_AMBULATORY_CARE_PROVIDER_SITE_OTHER): Payer: Self-pay | Admitting: Orthopedic Surgery

## 2016-10-11 VITALS — Ht 65.0 in | Wt 160.0 lb

## 2016-10-11 DIAGNOSIS — M898X5 Other specified disorders of bone, thigh: Secondary | ICD-10-CM

## 2016-10-11 DIAGNOSIS — S72322D Displaced transverse fracture of shaft of left femur, subsequent encounter for closed fracture with routine healing: Secondary | ICD-10-CM

## 2016-10-11 NOTE — Progress Notes (Signed)
Office Visit Note   Patient: Erica Clements           Date of Birth: 1990-07-20           MRN: [id number] Visit Date: 10/11/2016              Requested by: No referring provider defined for this encounter. PCP: No PCP Per Patient  Chief Complaint  Patient presents with  . Left Leg - Routine Post Op    09/12/16 left femur IM nail      HPI: Patient's 4 weeks status post mid shaft and supracondylar left femur fracture status post intramedullary nailing. Patient's current ambulating with a walker she states that she is feeling better has essentially no pain with her activities of daily living. She states she like to go back to work because she is bored.  Assessment & Plan: Visit Diagnoses:  1. Pain of left femur   2. Closed displaced transverse fracture of shaft of left femur with routine healing, subsequent encounter     Plan: Patient is given a note to return to work on Wednesday, 10/13/2016, she is used to cane or a crutch in the right hand. She will minimize her activities she will back off if she is feeling any symptoms. Recommended using ibuprofen 600 mg 3 times a day as needed for pain  Follow-Up Instructions: Return in about 4 weeks (around 11/08/2016).   Ortho Exam  Patient is alert, oriented, no adenopathy, well-dressed, normal affect, normal respiratory effort. Examination patient has a slow gait with using the walker. She is asymptomatic with range of motion of the hip and knee.  Imaging: Xr Femur Min 2 Views Left  Result Date: 10/11/2016 2 view radiographs of left femur shows essentially anatomic alignment of the supracondylar femur fracture with good callus formation. Two-view radiographs of the midshaft femur fracture also shows stable alignment with good interdigitation of the fragments and good callus formation.   Labs: Lab Results  Component Value Date   REPTSTATUS 09/22/2015 FINAL 09/20/2015   CULT  09/20/2015    MULTIPLE SPECIES PRESENT, SUGGEST  RECOLLECTION NO GROUP B STREP (S.AGALACTIAE) ISOLATED Performed at [facility]     Orders:  Orders Placed This Encounter  Procedures  . XR FEMUR MIN 2 VIEWS LEFT   No orders of the defined types were placed in this encounter.    Procedures: No procedures performed  Clinical Data: No additional findings.  ROS:  All other systems negative, except as noted in the HPI. Review of Systems  Objective: Vital Signs: Ht  (1.651 m)   Wt 160 lb (72.6 kg)   LMP  (LMP Unknown) Comment: Pregnancy test done with ISTAT. Pregnancy test negative according to ISTAT.  BMI 26.63 kg/m   Specialty Comments:  No specialty comments available.  PMFS History: Patient Active Problem List   Diagnosis Date Noted  . Closed displaced transverse fracture of shaft of left femur (HCC) 09/12/2016  . Closed supracondylar fracture of left femur (HCC)   . Vaginal delivery 11/27/2015  . Normal labor 11/25/2015  . Preterm labor 10/12/2015  . Preterm contractions 10/11/2015  . Pregnancy 02/01/2014  . Active labor 01/19/2014   Past Medical History:  Diagnosis Date  . Medical history non-contributory     Family History  Problem Relation Age of Onset  . Cancer Paternal Grandmother   . Diabetes Paternal Grandmother     Past Surgical History:  Procedure Laterality Date  . FEMUR IM NAIL Left 09/12/2016  Procedure: INTRAMEDULLARY (IM) NAIL FEMORAL;  Surgeon: Nadara Mustard, MD;  Location: [facility] OR;  Service: Orthopedics;  Laterality: Left;  . NO PAST SURGERIES     Social History   Occupational History  . Not on file.   Social History Main Topics  . Smoking status: Unknown If Ever Smoked  . Smokeless tobacco: Never Used  . Alcohol use No     Comment: Socially  . Drug use: No  . Sexual activity: Yes

## 2016-10-13 ENCOUNTER — Telehealth (INDEPENDENT_AMBULATORY_CARE_PROVIDER_SITE_OTHER): Payer: Self-pay | Admitting: Orthopedic Surgery

## 2016-10-13 NOTE — Telephone Encounter (Signed)
Patient called asked if Dr Lajoyce Corners could sign the return to work note for her and fax it to her employer. The fax# is [phone] Attn: [employer]   The number to contact patient is [phone]

## 2016-10-14 NOTE — Telephone Encounter (Signed)
Note faxed [phone] attn Olegario Messier

## 2016-11-08 ENCOUNTER — Ambulatory Visit (INDEPENDENT_AMBULATORY_CARE_PROVIDER_SITE_OTHER): Payer: Self-pay | Admitting: Orthopedic Surgery

## 2016-11-08 VITALS — Ht 65.0 in | Wt 160.0 lb

## 2016-11-08 DIAGNOSIS — S72322D Displaced transverse fracture of shaft of left femur, subsequent encounter for closed fracture with routine healing: Secondary | ICD-10-CM

## 2016-11-08 NOTE — Progress Notes (Signed)
   Office Visit Note   Patient: Erica Clements           Date of Birth: [birth year]           MRN: [id number] Visit Date: 11/08/2016              Requested by: No referring provider defined for this encounter. PCP: Patient, No Pcp Per  Chief Complaint  Patient presents with  . Left Leg - Routine Post Op    Left femur IM nail 09/12/16      HPI: Patient states she's been having episodes of the muscles giving way in her left leg worse after she does therapy. She is heavily correct for this.  Assessment & Plan: Visit Diagnoses:  1. Closed displaced transverse fracture of shaft of left femur with routine healing, subsequent encounter     Plan: Patient was given a note that states that she periodically may have to leave work early due to the orthopedic condition of her left leg.  Follow-up the office in 4 weeks.  2 view radiographs left femur follow-up.  Follow-Up Instructions: Return in about 4 weeks (around 12/06/2016).   Ortho Exam  Patient is alert, oriented, no adenopathy, well-dressed, normal affect, normal respiratory effort. Examination there is no redness no cellulitis no signs of infection the incisions have slight thickening of the scar in the importance of scar massage was discussed.  Imaging: No results found.  Labs: Lab Results  Component Value Date   REPTSTATUS 09/22/2015 FINAL 09/20/2015   CULT  09/20/2015    MULTIPLE SPECIES PRESENT, SUGGEST RECOLLECTION NO GROUP B STREP (S.AGALACTIAE) ISOLATED [facility]     Orders:  No orders of the defined types were placed in this encounter.  No orders of the defined types were placed in this encounter.    Procedures: No procedures performed  Clinical Data: No additional findings.  ROS:  All other systems negative, except as noted in the HPI. Review of Systems  Objective: Vital Signs: Ht 5\' 5"  (1.651 m)   Wt 160 lb (72.6 kg)   BMI 26.63 kg/m   Specialty Comments:  No specialty  comments available.  PMFS History: Patient Active Problem List   Diagnosis Date Noted  . Closed displaced transverse fracture of shaft of left femur (HCC) 09/12/2016  . Closed supracondylar fracture of left femur (HCC)   . Vaginal delivery 11/27/2015  . Normal labor 11/25/2015  . Preterm labor 10/12/2015  . Preterm contractions 10/11/2015  . Pregnancy 02/01/2014  . Active labor 01/19/2014   Past Medical History:  Diagnosis Date  . Medical history non-contributory     Family History  Problem Relation Age of Onset  . Cancer Paternal Grandmother   . Diabetes Paternal Grandmother     Past Surgical History:  Procedure Laterality Date  . FEMUR IM NAIL Left 09/12/2016   Procedure: INTRAMEDULLARY (IM) NAIL FEMORAL;  Surgeon: Nadara MustardMarcus Algie Cales V, MD;  Location: [facility] OR;  Service: Orthopedics;  Laterality: Left;  . NO PAST SURGERIES     Social History   Occupational History  . Not on file.   Social History Main Topics  . Smoking status: Unknown If Ever Smoked  . Smokeless tobacco: Never Used  . Alcohol use No     Comment: Socially  . Drug use: No  . Sexual activity: Yes

## 2016-12-06 ENCOUNTER — Ambulatory Visit (INDEPENDENT_AMBULATORY_CARE_PROVIDER_SITE_OTHER): Payer: Managed Care, Other (non HMO) | Admitting: Orthopedic Surgery

## 2016-12-09 ENCOUNTER — Ambulatory Visit (INDEPENDENT_AMBULATORY_CARE_PROVIDER_SITE_OTHER): Payer: Managed Care, Other (non HMO) | Admitting: Orthopedic Surgery

## 2017-02-24 NOTE — Addendum Note (Signed)
Addendum  created 02/24/17 1134 by Taygan Connell, MD   Sign clinical note    

## 2017-02-27 ENCOUNTER — Emergency Department (HOSPITAL_COMMUNITY): Payer: Managed Care, Other (non HMO)

## 2017-02-27 ENCOUNTER — Encounter (HOSPITAL_COMMUNITY): Payer: Self-pay | Admitting: Emergency Medicine

## 2017-02-27 ENCOUNTER — Emergency Department (HOSPITAL_COMMUNITY)
Admission: EM | Admit: 2017-02-27 | Discharge: 2017-02-27 | Disposition: A | Discharge status: Home / Self Care | Payer: Managed Care, Other (non HMO) | Attending: Emergency Medicine | Admitting: Emergency Medicine

## 2017-02-27 DIAGNOSIS — M25571 Pain in right ankle and joints of right foot: Secondary | ICD-10-CM | POA: Diagnosis present

## 2017-02-27 MED ORDER — NAPROXEN 500 MG PO TABS: 500.0000 mg | ORAL_TABLET | Freq: Two times a day (BID) | ORAL | 0 refills | Status: DC

## 2017-02-27 NOTE — ED Notes (Signed)
Patient transported to X-ray 

## 2017-02-27 NOTE — Discharge Instructions (Signed)
Please read attached information regarding your condition and Rice therapy. Wear ankle brace as directed. Use crutches as needed. Weight-bear as tolerated to prevent stiffness. Follow-up with foot specialist this is below for further evaluation. Take ibuprofen or Aleve as needed for pain and inflammation. Return to ED for worsening pain, additional falls, increased swelling of joint, color or temperature change in joint, numbness.

## 2017-02-27 NOTE — ED Notes (Signed)
Ortho tech called 

## 2017-02-27 NOTE — ED Provider Notes (Signed)
[facility]-EMER[facility]ENCY DEPT Provider Note   CSN: [id number] Arrival date & time: 02/27/17  1023     History   Chief Complaint Chief Complaint  Patient presents with  . Fall  . Foot Pain  . Ankle Pain    HPI Erica Clements is a 26 y.o. female.  HPI  Patient presents to ED for evaluation of right foot and ankle pain that began last night. Describes the pain as sharp and rates it 9/10. She states that she was climbing stairs when she missed 2 set of stairs and twisted her right ankle. She has been ambulatory but reports pain worse with weightbearing. She denies any previous fracture, dislocation or procedure done in this area. She has not tried any medications prior to arrival. She denies any numbness, weakness, falls, fever.  Past Medical History:  Diagnosis Date  . Medical history non-contributory     Patient Active Problem List   Diagnosis Date Noted  . Closed displaced transverse fracture of shaft of left femur (HCC) 09/12/2016  . Closed supracondylar fracture of left femur (HCC)   . Vaginal delivery 11/27/2015  . Normal labor 11/25/2015  . Preterm labor 10/12/2015  . Preterm contractions 10/11/2015  . Pregnancy 02/01/2014  . Active labor 01/19/2014    Past Surgical History:  Procedure Laterality Date  . FEMUR IM NAIL Left 09/12/2016   Procedure: INTRAMEDULLARY (IM) NAIL FEMORAL;  Surgeon: Nadara Mustard, MD;  Location: [facility] OR;  Service: Orthopedics;  Laterality: Left;  . NO PAST SURGERIES      OB History    Gravida Para Term Preterm AB Living   2 2 2  0 0 2   SAB TAB Ectopic Multiple Live Births   0 0 0   2       Home Medications    Prior to Admission medications   Medication Sig Start Date End Date Taking? Authorizing Provider  naproxen (NAPROSYN) 500 MG tablet Take 1 tablet (500 mg total) by mouth 2 (two) times daily. 02/27/17   Kalab Camps, PA-C  oxyCODONE-acetaminophen (PERCOCET/ROXICET) 5-325 MG tablet Take 1 tablet by mouth every 4 (four) hours as needed (pain  scale 4-7). 11/27/15   Candice Camp, MD  oxyCODONE-acetaminophen (ROXICET) 5-325 MG tablet Take 1 tablet by mouth every 4 (four) hours as needed for severe pain. 09/15/16   Nadara Mustard, MD    Family History Family History  Problem Relation Age of Onset  . Cancer Paternal Grandmother   . Diabetes Paternal Grandmother     Social History Social History  Substance Use Topics  . Smoking status: Unknown If Ever Smoked  . Smokeless tobacco: Never Used  . Alcohol use No     Comment: Socially     Allergies   Patient has no known allergies.   Review of Systems Review of Systems  Constitutional: Negative for chills, fatigue and fever.  Musculoskeletal: Positive for arthralgias, gait problem and myalgias. Negative for joint swelling.  Skin: Negative for color change, rash and wound.  Neurological: Negative for weakness and numbness.     Physical Exam Updated Vital Signs BP (!) 103/59 (BP Location: Left Arm)   Pulse 86   Temp 97.8 F (36.6 C) (Oral)   Resp 17   Ht 5\' 5"  (1.651 m)   LMP 02/18/2017   SpO2 100%   Physical Exam  Constitutional: She appears well-developed and well-nourished. No distress.  HENT:  Head: Normocephalic and atraumatic.  Eyes: Conjunctivae and EOM are normal. No scleral icterus.  Neck:  Normal range of motion.  Pulmonary/Chest: Effort normal. No respiratory distress.  Musculoskeletal: Normal range of motion. She exhibits tenderness. She exhibits no edema or deformity.       Feet:  Tenderness to palpation in the medial area of the right foot. Full active and passive range of motion of digits and ankle. No edema, color or temperature change noted in joints. 2+ DP pulse. Sensation intact to light touch.  Neurological: She is alert.  Skin: No rash noted. She is not diaphoretic.  Psychiatric: She has a normal mood and affect.  Nursing note and vitals reviewed.    ED Treatments / Results  Labs (all labs ordered are listed, but only abnormal results  are displayed) Labs Reviewed - No data to display  EKG  EKG Interpretation None       Radiology Dg Ankle Complete Right  Result Date: 02/27/2017 CLINICAL DATA:  Fall EXAM: RIGHT ANKLE - COMPLETE 3+ VIEW COMPARISON:  None. FINDINGS: No fracture or dislocation is seen. The ankle mortise is intact. The base of the fifth metatarsal is unremarkable. IMPRESSION: Negative. Electronically Signed   By: Charline Bills M.D.   On: 02/27/2017 11:48   Dg Foot Complete Right  Result Date: 02/27/2017 CLINICAL DATA:  Fall EXAM: RIGHT FOOT COMPLETE - 3+ VIEW COMPARISON:  None. FINDINGS: No fracture or dislocation is seen. The joint spaces are preserved. Visualized soft tissues are within normal limits. IMPRESSION: Negative. Electronically Signed   By: Charline Bills M.D.   On: 02/27/2017 11:48    Procedures Procedures (including critical care time)  Medications Ordered in ED Medications - No data to display   Initial Impression / Assessment and Plan / ED Course  I have reviewed the triage vital signs and the nursing notes.  Pertinent labs & imaging results that were available during my care of the patient were reviewed by me and considered in my medical decision making (see chart for details).  Clinical Course as of Feb 27 1217  Wynelle Link Feb 27, 2017  1128 Patient taken for x-ray.  [HK]    Clinical Course User Index [HK] Dietrich Pates, PA-C    Patient presents to ED for evaluation of right foot and ankle pain for the past day. She states that she tripped over some stairs yesterday and twisted her ankle. She is ambulatory but reports pain worse with weightbearing. Physical exam there is tenderness to palpation on the right medial foot. There is no swelling, color or temperature changes noted making suspicious for septic joint. She has full active and passive range of motion of digits and ankle. X-rays of foot and ankle were negative. Will give patient ankle brace, referred to specialist and  advised anti-inflammatories as needed for pain. Patient states that she has crutches at home that she can use. Advised to continue rice therapy and weight-bear as tolerated. Patient appears stable for discharge at this time. Strict return precautions given.  Final Clinical Impressions(s) / ED Diagnoses   Final diagnoses:  Acute right ankle pain    New Prescriptions New Prescriptions   NAPROXEN (NAPROSYN) 500 MG TABLET    Take 1 tablet (500 mg total) by mouth 2 (two) times daily.     Dietrich Pates, PA-C 02/27/17 1218    Shaune Pollack, MD 02/28/17 [phone]

## 2017-02-27 NOTE — ED Triage Notes (Signed)
Pt. Stated, I fell last night and twisted my foot and rt. Ankle.

## 2017-02-27 NOTE — ED Notes (Signed)
Ortho tech in. 

## 2017-03-31 ENCOUNTER — Telehealth (INDEPENDENT_AMBULATORY_CARE_PROVIDER_SITE_OTHER): Payer: Self-pay | Admitting: Orthopedic Surgery

## 2017-03-31 NOTE — Telephone Encounter (Signed)
Billing 09/12/2016 - present faxed to [employer] [id number]

## 2017-07-26 ENCOUNTER — Inpatient Hospital Stay (HOSPITAL_COMMUNITY)
Admission: AD | Admit: 2017-07-26 | Discharge: 2017-07-26 | Disposition: A | Discharge status: Home / Self Care | Payer: Managed Care, Other (non HMO) | Source: Ambulatory Visit | Attending: Obstetrics and Gynecology | Admitting: Obstetrics and Gynecology

## 2017-07-26 ENCOUNTER — Encounter (HOSPITAL_COMMUNITY): Payer: Self-pay | Admitting: *Deleted

## 2017-07-26 ENCOUNTER — Inpatient Hospital Stay (HOSPITAL_COMMUNITY): Payer: Managed Care, Other (non HMO)

## 2017-07-26 ENCOUNTER — Other Ambulatory Visit: Payer: Self-pay

## 2017-07-26 DIAGNOSIS — O26891 Other specified pregnancy related conditions, first trimester: Secondary | ICD-10-CM | POA: Insufficient documentation

## 2017-07-26 DIAGNOSIS — R109 Unspecified abdominal pain: Secondary | ICD-10-CM | POA: Diagnosis not present

## 2017-07-26 DIAGNOSIS — R1031 Right lower quadrant pain: Secondary | ICD-10-CM | POA: Insufficient documentation

## 2017-07-26 DIAGNOSIS — Z3A01 Less than 8 weeks gestation of pregnancy: Secondary | ICD-10-CM | POA: Insufficient documentation

## 2017-07-26 DIAGNOSIS — O210 Mild hyperemesis gravidarum: Secondary | ICD-10-CM | POA: Insufficient documentation

## 2017-07-26 DIAGNOSIS — Z3490 Encounter for supervision of normal pregnancy, unspecified, unspecified trimester: Secondary | ICD-10-CM

## 2017-07-26 DIAGNOSIS — R112 Nausea with vomiting, unspecified: Secondary | ICD-10-CM | POA: Diagnosis present

## 2017-07-26 DIAGNOSIS — O26899 Other specified pregnancy related conditions, unspecified trimester: Secondary | ICD-10-CM

## 2017-07-26 LAB — CBC
HCT: 36.8 % (ref 36.0–46.0)
Hemoglobin: 12.6 g/dL (ref 12.0–15.0)
MCH: 30.4 pg (ref 26.0–34.0)
MCHC: 34.2 g/dL (ref 30.0–36.0)
MCV: 88.7 fL (ref 78.0–100.0)
PLATELETS: 317 10*3/uL (ref 150–400)
RBC: 4.15 MIL/uL (ref 3.87–5.11)
RDW: 12.4 % (ref 11.5–15.5)
WBC: 6 10*3/uL (ref 4.0–10.5)

## 2017-07-26 LAB — WET PREP, GENITAL
CLUE CELLS WET PREP: NONE SEEN
Sperm: NONE SEEN
TRICH WET PREP: NONE SEEN
YEAST WET PREP: NONE SEEN

## 2017-07-26 LAB — COMPREHENSIVE METABOLIC PANEL
ALK PHOS: 51 U/L (ref 38–126)
ALT: 11 U/L — ABNORMAL LOW (ref 14–54)
ANION GAP: 5 (ref 5–15)
AST: 14 U/L — ABNORMAL LOW (ref 15–41)
Albumin: 3.6 g/dL (ref 3.5–5.0)
BILIRUBIN TOTAL: 0.6 mg/dL (ref 0.3–1.2)
BUN: 7 mg/dL (ref 6–20)
CALCIUM: 8.7 mg/dL — AB (ref 8.9–10.3)
CO2: 26 mmol/L (ref 22–32)
CREATININE: 0.61 mg/dL (ref 0.44–1.00)
Chloride: 103 mmol/L (ref 101–111)
Glucose, Bld: 69 mg/dL (ref 65–99)
Potassium: 3.3 mmol/L — ABNORMAL LOW (ref 3.5–5.1)
SODIUM: 134 mmol/L — AB (ref 135–145)
TOTAL PROTEIN: 7.4 g/dL (ref 6.5–8.1)

## 2017-07-26 LAB — URINALYSIS, ROUTINE W REFLEX MICROSCOPIC
Bilirubin Urine: NEGATIVE
Glucose, UA: NEGATIVE mg/dL
KETONES UR: NEGATIVE mg/dL
Nitrite: NEGATIVE
PH: 5 (ref 5.0–8.0)
Protein, ur: NEGATIVE mg/dL
Specific Gravity, Urine: 1.021 (ref 1.005–1.030)

## 2017-07-26 LAB — POCT PREGNANCY, URINE: PREG TEST UR: POSITIVE — AB

## 2017-07-26 LAB — HCG, QUANTITATIVE, PREGNANCY: hCG, Beta Chain, Quant, S: 9772 m[IU]/mL — ABNORMAL HIGH (ref <5)

## 2017-07-26 MED ORDER — PROMETHAZINE HCL 25 MG PO TABS: 25.0000 mg | ORAL_TABLET | Freq: Four times a day (QID) | ORAL | 0 refills | Status: DC | PRN

## 2017-07-26 NOTE — MAU Provider Note (Signed)
History     CSN: [id number]  Arrival date and time: 07/26/17 1208  Chief Complaint  Patient presents with  . Abdominal Pain  . Emesis  . Possible Pregnancy   G3P2002 @[redacted]w[redacted]d  by LMP here with N/V and LAP. Sx started this am. Emesis x2. Was able to tolerate water. Describes pain as sharp and intermittent in RLQ. Took Ibuprofen and helped some. No urinary sx. No fevers. No VB or discharge. LMP was 06/21/17, has not taken a pregnancy test.   OB History    Gravida Para Term Preterm AB Living   3 2 2  0 0 2   SAB TAB Ectopic Multiple Live Births   0 0 0   2      Past Medical History:  Diagnosis Date  . Medical history non-contributory     Past Surgical History:  Procedure Laterality Date  . FEMUR IM NAIL Left 09/12/2016   Procedure: INTRAMEDULLARY (IM) NAIL FEMORAL;  Surgeon: Nadara Mustard, MD;  Location: [facility] OR;  Service: Orthopedics;  Laterality: Left;  . NO PAST SURGERIES      Family History  Problem Relation Age of Onset  . Cancer Paternal Grandmother   . Diabetes Paternal Grandmother     Social History   Tobacco Use  . Smoking status: Unknown If Ever Smoked  . Smokeless tobacco: Never Used  Substance Use Topics  . Alcohol use: No    Comment: Socially  . Drug use: No    Allergies: No Known Allergies  Medications Prior to Admission  Medication Sig Dispense Refill Last Dose  . ibuprofen (ADVIL,MOTRIN) 200 MG tablet Take 200 mg by mouth every 6 (six) hours as needed for mild pain.   07/25/2017 at Unknown time  . naproxen (NAPROSYN) 500 MG tablet Take 1 tablet (500 mg total) by mouth 2 (two) times daily. (Patient not taking: Reported on 07/26/2017) 30 tablet 0 Not Taking at Unknown time    Review of Systems  Constitutional: Negative for chills and fever.  Gastrointestinal: Positive for abdominal pain, nausea and vomiting. Negative for constipation and diarrhea.  Genitourinary: Negative for dysuria, frequency, urgency, vaginal bleeding and vaginal discharge.    Physical Exam   Blood pressure (!) 101/59, pulse 87, temperature 98.7 F (37.1 C), temperature source Oral, resp. rate 18, last menstrual period 06/21/2017, unknown if currently breastfeeding.  Physical Exam  Nursing note and vitals reviewed. Constitutional: She is oriented to person, place, and time. She appears well-developed and well-nourished. No distress.  HENT:  Head: Normocephalic and atraumatic.  Neck: Normal range of motion.  Cardiovascular: Normal rate.  Respiratory: Effort normal. No respiratory distress.  GI: Soft. She exhibits no distension and no mass. There is no tenderness. There is no rebound and no guarding.  Genitourinary:  Genitourinary Comments: External: no lesions or erythema Vagina: rugated, pink, moist, thin white discharge Uterus: non enlarged, anteverted, + tender, no CMT Adnexae: no masses, no tenderness left, no tenderness right   Musculoskeletal: Normal range of motion.  Neurological: She is alert and oriented to person, place, and time.  Skin: Skin is warm and dry.  Psychiatric: She has a normal mood and affect.   Results for orders placed or performed during the hospital encounter of 07/26/17 (from the past 24 hour(s))  Urinalysis, Routine w reflex microscopic     Status: Abnormal   Collection Time: 07/26/17 12:20 PM  Result Value Ref Range   Color, Urine YELLOW YELLOW   APPearance HAZY (A) CLEAR   Specific Gravity, Urine  1.021 1.005 - 1.030   pH 5.0 5.0 - 8.0   Glucose, UA NEGATIVE NEGATIVE mg/dL   Hgb urine dipstick SMALL (A) NEGATIVE   Bilirubin Urine NEGATIVE NEGATIVE   Ketones, ur NEGATIVE NEGATIVE mg/dL   Protein, ur NEGATIVE NEGATIVE mg/dL   Nitrite NEGATIVE NEGATIVE   Leukocytes, UA MODERATE (A) NEGATIVE   RBC / HPF 6-30 0 - 5 RBC/hpf   WBC, UA TOO NUMEROUS TO COUNT 0 - 5 WBC/hpf   Bacteria, UA RARE (A) NONE SEEN   Squamous Epithelial / LPF 6-30 (A) NONE SEEN   Mucus PRESENT   Pregnancy, urine POC     Status: Abnormal    Collection Time: 07/26/17 12:34 PM  Result Value Ref Range   Preg Test, Ur POSITIVE (A) NEGATIVE  CBC     Status: None   Collection Time: 07/26/17 12:48 PM  Result Value Ref Range   WBC 6.0 4.0 - 10.5 K/uL   RBC 4.15 3.87 - 5.11 MIL/uL   Hemoglobin 12.6 12.0 - 15.0 g/dL   HCT 69.6 29.5 - 28.4 %   MCV 88.7 78.0 - 100.0 fL   MCH 30.4 26.0 - 34.0 pg   MCHC 34.2 30.0 - 36.0 g/dL   RDW 13.2 44.0 - 10.2 %   Platelets 317 150 - 400 K/uL  hCG, quantitative, pregnancy     Status: Abnormal   Collection Time: 07/26/17 12:48 PM  Result Value Ref Range   hCG, Beta Chain, Quant, S 9,772 (H) <5 mIU/mL  Comprehensive metabolic panel     Status: Abnormal   Collection Time: 07/26/17 12:48 PM  Result Value Ref Range   Sodium 134 (L) 135 - 145 mmol/L   Potassium 3.3 (L) 3.5 - 5.1 mmol/L   Chloride 103 101 - 111 mmol/L   CO2 26 22 - 32 mmol/L   Glucose, Bld 69 65 - 99 mg/dL   BUN 7 6 - 20 mg/dL   Creatinine, Ser 7.25 0.44 - 1.00 mg/dL   Calcium 8.7 (L) 8.9 - 10.3 mg/dL   Total Protein 7.4 6.5 - 8.1 g/dL   Albumin 3.6 3.5 - 5.0 g/dL   AST 14 (L) 15 - 41 U/L   ALT 11 (L) 14 - 54 U/L   Alkaline Phosphatase 51 38 - 126 U/L   Total Bilirubin 0.6 0.3 - 1.2 mg/dL   GFR calc non Af Amer >60 >60 mL/min   GFR calc Af Amer >60 >60 mL/min   Anion gap 5 5 - 15  Wet prep, genital     Status: Abnormal   Collection Time: 07/26/17  1:10 PM  Result Value Ref Range   Yeast Wet Prep HPF POC NONE SEEN NONE SEEN   Trich, Wet Prep NONE SEEN NONE SEEN   Clue Cells Wet Prep HPF POC NONE SEEN NONE SEEN   WBC, Wet Prep HPF POC FEW (A) NONE SEEN   Sperm NONE SEEN    US Ob Less Than 14 Weeks With Ob Transvaginal  Result Date: 07/26/2017 CLINICAL DATA:  27 year old pregnant female presents with 1 day of sharp lower abdominal pain. Quantitative beta HCG [id number]. EDC by LMP: 03/28/2018, projecting to an expected gestational age of [redacted] weeks 0 days. EXAM: OBSTETRIC <14 WK [place] AND TRANSVAGINAL OB US TECHNIQUE: Both  transabdominal and transvaginal ultrasound examinations were performed for complete evaluation of the gestation as well as the maternal uterus, adnexal regions, and pelvic cul-de-sac. Transvaginal technique was performed to assess early pregnancy. COMPARISON:  None. FINDINGS: Intrauterine  gestational sac: Single intrauterine gestational sac is normal in position and contour. Yolk sac:  Visualized. Embryo:  Not Visualized. Embryonic cardiac Activity: Not Visualized. MSD: 8.0 mm   5 w   3 d Subchorionic hemorrhage:  None visualized. Maternal uterus/adnexae: Anteverted uterus with no uterine fibroids. Right ovary measures 3.3 x 1.9 x 2.2 cm and contains a corpus luteum. Left ovary measures 2.1 x 1.5 x 1.3 cm. No abnormal ovarian or adnexal masses. No abnormal free fluid in the pelvis. IMPRESSION: 1. Single intrauterine gestational sac with yolk sac at 5 weeks 3 days by mean sac diameter. No embryo detected at this time, which could be due to early gestational age. Recommend attention on follow-up transvaginal obstetric scan in 11-14 days. This recommendation follows SRU consensus guidelines: Diagnostic Criteria for Nonviable Pregnancy Early in the First Trimester. Malva Limes Engl J Med 2013; 161:0960-45; 369:1443-51. 2. No ovarian or adnexal abnormality. Electronically Signed   By: Delbert PhenixJason A Poff M.D.   On: 07/26/2017 14:36   MAU Course  Procedures  MDM Labs and US ordered and reviewed. No evidence pf UTI or pelvic infection. Early IUP seen on US-discussed results with pt. No evidence of acute abdominal process. Rx Phenergan. Presentation, clinical findings, and plan discussed with Dr. Vincente PoliGrewal. Stable for discharge home.  Assessment and Plan   1. Early stage of pregnancy   2. Abdominal pain affecting pregnancy   3. Morning sickness    Discharge home Follow up in OB office to start care Rx Phenergan Return precautions  Allergies as of 07/26/2017   No Known Allergies     Medication List    STOP taking these medications    ibuprofen 200 MG tablet Commonly known as:  ADVIL,MOTRIN   naproxen 500 MG tablet Commonly known as:  NAPROSYN     TAKE these medications   promethazine 25 MG tablet Commonly known as:  PHENERGAN Take 1 tablet (25 mg total) by mouth every 6 (six) hours as needed for nausea or vomiting.      Donette LarryMelanie Zai Chmiel, CNM 07/26/2017, 2:41 PM

## 2017-07-26 NOTE — MAU Note (Signed)
Sharp pain in lower abd started this morning.  Started throwing up this morning, everything she has tried to eat, has came up. No HPT

## 2017-07-26 NOTE — Discharge Instructions (Signed)
Eating Plan for Hyperemesis Gravidarum °Hyperemesis gravidarum is a severe form of morning sickness. Because this condition causes severe nausea and vomiting, it can lead to dehydration, malnutrition, and weight loss. One way to lessen the symptoms of nausea and vomiting is to follow the eating plan for hyperemesis gravidarum. It is often used along with prescribed medicines to control your symptoms. °What can I do to relieve my symptoms? °Listen to your body. Everyone is different and has different preferences. Find what works best for you. Take any of the following actions that are helpful to you: °· Eat and drink slowly. °· Eat 5-6 small meals daily instead of 3 large meals. °· Eat crackers before you get out of bed in the morning. °· Try having a snack in the middle of the night. °· Starchy foods are usually tolerated well. Examples include cereal, toast, bread, potatoes, pasta, rice, and pretzels. °· Ginger may help with nausea. Add ¼ tsp ground ginger to hot tea or choose ginger tea. °· Try drinking 100% fruit juice or an electrolyte drink. An electrolyte drink contains sodium, potassium, and chloride. °· Continue to take your prenatal vitamins as told by your health care provider. If you are having trouble taking your prenatal vitamins, talk with your health care provider about different options. °· Include at least 1 serving of protein with your meals and snacks. Protein options include meats or poultry, beans, nuts, eggs, and yogurt. Try eating a protein-rich snack before bed. Examples of these snacks include cheese and crackers or half of a peanut butter or turkey sandwich. °· Consider eliminating foods that trigger your symptoms. These may include spicy foods, coffee, high-fat foods, very sweet foods, and acidic foods. °· Try meals that have more protein combined with bland, salty, lower-fat, and dry foods, such as nuts, seeds, pretzels, crackers, and cereal. °· Talk with your healthcare provider about  starting a supplement of vitamin B6. °· Have fluids that are cold, clear, and carbonated or sour. Examples include lemonade, ginger ale, lemon-lime soda, ice water, and sparkling water. °· Try lemon or mint tea. °· Try brushing your teeth or using a mouth rinse after meals. ° °What should I avoid to reduce my symptoms? °Avoiding some of the following things may help reduce your symptoms. °· Foods with strong smells. Try eating meals in well-ventilated areas that are free of odors. °· Drinking water or other beverages with meals. Try not to drink anything during the 30 minutes before and after your meals. °· Drinking more than 1 cup of fluid at a time. Sometimes using a straw helps. °· Fried or high-fat foods, such as butter and cream sauces. °· Spicy foods. °· Skipping meals as best as you can. Nausea can be more intense on an empty stomach. If you cannot tolerate food at that time, do not force it. Try sucking on ice chips or other frozen items, and make up for missed calories later. °· Lying down within 2 hours after eating. °· Environmental triggers. These may include smoky rooms, closed spaces, rooms with strong smells, warm or humid places, overly loud and noisy rooms, and rooms with motion or flickering lights. °· Quick and sudden changes in your movement. ° °This information is not intended to replace advice given to you by your health care provider. Make sure you discuss any questions you have with your health care provider. °Document Released: 04/18/2007 Document Revised: 02/18/2016 Document Reviewed: 01/20/2016 °Elsevier Interactive Patient Education © 2018 [employer] °Morning Sickness °Morning sickness   is when you feel sick to your stomach (nauseous) during pregnancy. You may feel sick to your stomach and throw up (vomit). You may feel sick in the morning, but you can feel this way any time of day. Some women feel very sick to their stomach and cannot stop throwing up (hyperemesis gravidarum). °Follow  these instructions at home: °· Only take medicines as told by your doctor. °· Take multivitamins as told by your doctor. Taking multivitamins before getting pregnant can stop or lessen the harshness of morning sickness. °· Eat dry toast or unsalted crackers before getting out of bed. °· Eat 5 to 6 small meals a day. °· Eat dry and bland foods like rice and baked potatoes. °· Do not drink liquids with meals. Drink between meals. °· Do not eat greasy, fatty, or spicy foods. °· Have someone cook for you if the smell of food causes you to feel sick or throw up. °· If you feel sick to your stomach after taking prenatal vitamins, take them at night or with a snack. °· Eat protein when you need a snack (nuts, yogurt, cheese). °· Eat unsweetened gelatins for dessert. °· Wear a bracelet used for sea sickness (acupressure wristband). °· Go to a doctor that puts thin needles into certain body points (acupuncture) to improve how you feel. °· Do not smoke. °· Use a humidifier to keep the air in your house free of odors. °· Get lots of fresh air. °Contact a doctor if: °· You need medicine to feel better. °· You feel dizzy or lightheaded. °· You are losing weight. °Get help right away if: °· You feel very sick to your stomach and cannot stop throwing up. °· You pass out (faint). °This information is not intended to replace advice given to you by your health care provider. Make sure you discuss any questions you have with your health care provider. °Document Released: 07/29/2004 Document Revised: 11/27/2015 Document Reviewed: 12/06/2012 °Elsevier Interactive Patient Education © 2017 [employer] ° °

## 2017-07-27 LAB — GC/CHLAMYDIA PROBE AMP (~~LOC~~) NOT AT ARMC
CHLAMYDIA, DNA PROBE: NEGATIVE
Neisseria Gonorrhea: NEGATIVE

## 2017-12-24 ENCOUNTER — Emergency Department (HOSPITAL_BASED_OUTPATIENT_CLINIC_OR_DEPARTMENT_OTHER)
Admission: EM | Admit: 2017-12-24 | Discharge: 2017-12-24 | Disposition: A | Discharge status: Home / Self Care | Payer: Managed Care, Other (non HMO) | Attending: Emergency Medicine | Admitting: Emergency Medicine

## 2017-12-24 ENCOUNTER — Encounter (HOSPITAL_BASED_OUTPATIENT_CLINIC_OR_DEPARTMENT_OTHER): Payer: Self-pay | Admitting: Emergency Medicine

## 2017-12-24 ENCOUNTER — Other Ambulatory Visit: Payer: Self-pay

## 2017-12-24 DIAGNOSIS — R197 Diarrhea, unspecified: Secondary | ICD-10-CM | POA: Insufficient documentation

## 2017-12-24 DIAGNOSIS — K2970 Gastritis, unspecified, without bleeding: Secondary | ICD-10-CM

## 2017-12-24 DIAGNOSIS — R1010 Upper abdominal pain, unspecified: Secondary | ICD-10-CM

## 2017-12-24 DIAGNOSIS — R112 Nausea with vomiting, unspecified: Secondary | ICD-10-CM

## 2017-12-24 DIAGNOSIS — A084 Viral intestinal infection, unspecified: Secondary | ICD-10-CM | POA: Insufficient documentation

## 2017-12-24 DIAGNOSIS — R1013 Epigastric pain: Secondary | ICD-10-CM | POA: Insufficient documentation

## 2017-12-24 LAB — COMPREHENSIVE METABOLIC PANEL
ALT: 13 U/L — ABNORMAL LOW (ref 14–54)
ANION GAP: 8 (ref 5–15)
AST: 14 U/L — ABNORMAL LOW (ref 15–41)
Albumin: 4.2 g/dL (ref 3.5–5.0)
Alkaline Phosphatase: 51 U/L (ref 38–126)
BUN: 7 mg/dL (ref 6–20)
CHLORIDE: 106 mmol/L (ref 101–111)
CO2: 26 mmol/L (ref 22–32)
Calcium: 9 mg/dL (ref 8.9–10.3)
Creatinine, Ser: 0.65 mg/dL (ref 0.44–1.00)
GFR calc Af Amer: >60 mL/min (ref >60)
Glucose, Bld: 102 mg/dL — ABNORMAL HIGH (ref 65–99)
POTASSIUM: 4.1 mmol/L (ref 3.5–5.1)
Sodium: 140 mmol/L (ref 135–145)
Total Bilirubin: 0.5 mg/dL (ref 0.3–1.2)
Total Protein: 8.1 g/dL (ref 6.5–8.1)

## 2017-12-24 LAB — URINALYSIS, ROUTINE W REFLEX MICROSCOPIC
Bilirubin Urine: NEGATIVE
GLUCOSE, UA: NEGATIVE mg/dL
Ketones, ur: 15 mg/dL — AB
LEUKOCYTES UA: NEGATIVE
NITRITE: NEGATIVE
PROTEIN: 30 mg/dL — AB
Specific Gravity, Urine: >1.03 — ABNORMAL HIGH (ref 1.005–1.030)
pH: 6 (ref 5.0–8.0)

## 2017-12-24 LAB — PREGNANCY, URINE: Preg Test, Ur: NEGATIVE

## 2017-12-24 LAB — LIPASE, BLOOD: LIPASE: 26 U/L (ref 11–51)

## 2017-12-24 LAB — URINALYSIS, MICROSCOPIC (REFLEX)

## 2017-12-24 LAB — CBC
HEMATOCRIT: 41.4 % (ref 36.0–46.0)
Hemoglobin: 14.5 g/dL (ref 12.0–15.0)
MCH: 31 pg (ref 26.0–34.0)
MCHC: 35 g/dL (ref 30.0–36.0)
MCV: 88.5 fL (ref 78.0–100.0)
Platelets: 337 10*3/uL (ref 150–400)
RBC: 4.68 MIL/uL (ref 3.87–5.11)
RDW: 12.1 % (ref 11.5–15.5)
WBC: 8.4 10*3/uL (ref 4.0–10.5)

## 2017-12-24 MED ORDER — SODIUM CHLORIDE 0.9 % IV BOLUS
1000.0000 mL | Freq: Once | INTRAVENOUS | Status: AC
  Administered 2017-12-24: 1000 mL via INTRAVENOUS

## 2017-12-24 MED ORDER — DICYCLOMINE HCL 10 MG PO CAPS
20.0000 mg | ORAL_CAPSULE | Freq: Once | ORAL | Status: AC
  Administered 2017-12-24: 20 mg via ORAL
  Filled 2017-12-24: qty 2

## 2017-12-24 MED ORDER — GI COCKTAIL ~~LOC~~
30.0000 mL | Freq: Once | ORAL | Status: AC
  Administered 2017-12-24: 30 mL via ORAL
  Filled 2017-12-24: qty 30

## 2017-12-24 MED ORDER — ONDANSETRON 4 MG PO TBDP: 4.0000 mg | ORAL_TABLET | Freq: Three times a day (TID) | ORAL | 0 refills | Status: DC | PRN

## 2017-12-24 MED ORDER — ONDANSETRON HCL 4 MG/2ML IJ SOLN
4.0000 mg | Freq: Once | INTRAMUSCULAR | Status: AC
  Administered 2017-12-24: 4 mg via INTRAVENOUS
  Filled 2017-12-24: qty 2

## 2017-12-24 MED ORDER — DICYCLOMINE HCL 20 MG PO TABS: 20.0000 mg | ORAL_TABLET | Freq: Three times a day (TID) | ORAL | 0 refills | Status: DC | PRN

## 2017-12-24 MED ORDER — RANITIDINE HCL 150 MG PO TABS: 150.0000 mg | ORAL_TABLET | Freq: Two times a day (BID) | ORAL | 0 refills | Status: DC

## 2017-12-24 MED ORDER — FAMOTIDINE IN NACL 20-0.9 MG/50ML-% IV SOLN
20.0000 mg | Freq: Once | INTRAVENOUS | Status: AC
  Administered 2017-12-24: 20 mg via INTRAVENOUS
  Filled 2017-12-24: qty 50

## 2017-12-24 NOTE — ED Notes (Signed)
Alert, NAD, calm, interactive, resps e/u, speaking in clear complete sentences, no dyspnea noted, skin W&D, "feel better, pain decreased, 3/10", (denies: sob, nausea, dizziness or visual changes).

## 2017-12-24 NOTE — ED Provider Notes (Signed)
[facility] EMERGENCY DEPARTMENT Provider Note   CSN: [id number] Arrival date & time: 12/24/17  1350     History   Chief Complaint Chief Complaint  Patient presents with  . Abdominal Pain  . N/V/D    HPI Erica Clements is a 27 y.o. otherwise healthy female, who presents to the ED with complaints of 4 days of epigastric abdominal pain and diarrhea, and today had one episode of nausea and nonbloody nonbilious emesis on the way here.  She describes the pain as 6/10 intermittent sharp nonradiating epigastric abdominal pain that worsens with eating anything and has been unrelieved with Midol and ibuprofen.  She reports having 2-3 episodes daily of nonbloody watery diarrhea.  She was not nauseous until today on the ambulance ride here, and vomited once.  She has not been given anything for her nausea prior to arrival.  Her LMP started 2 days ago.  She is sexually active with one female partner, protected with condoms.  She admits to drinking alcohol recently, had 2 shots yesterday, and drinks about 4 shots once a week on average.  She denies fevers, chills, CP, SOB, constipation, obstipation, melena, hematochezia, hematemesis, hematuria, dysuria, vaginal bleeding/discharge, myalgias, arthralgias, numbness, tingling, focal weakness, or any other complaints at this time. Denies recent travel, sick contacts, suspicious food intake, frequent NSAID use, recent abx, or prior abd surgeries.  Denies having any medical problems.   The history is provided by the patient and medical records. No language interpreter was used.  Abdominal Pain   Associated symptoms include diarrhea, nausea and vomiting. Pertinent negatives include fever, constipation, dysuria, hematuria, arthralgias and myalgias.    Past Medical History:  Diagnosis Date  . Medical history non-contributory     Patient Active Problem List   Diagnosis Date Noted  . Closed displaced transverse fracture of shaft of left femur (HCC)  09/12/2016  . Closed supracondylar fracture of left femur (HCC)   . Vaginal delivery 11/27/2015  . Normal labor 11/25/2015  . Preterm labor 10/12/2015  . Preterm contractions 10/11/2015  . Pregnancy 02/01/2014  . Active labor 01/19/2014    Past Surgical History:  Procedure Laterality Date  . FEMUR IM NAIL Left 09/12/2016   Procedure: INTRAMEDULLARY (IM) NAIL FEMORAL;  Surgeon: Nadara Mustard, MD;  Location: [facility] OR;  Service: Orthopedics;  Laterality: Left;  . NO PAST SURGERIES       OB History    Gravida  3   Para  2   Term  2   Preterm  0   AB  0   Living  2     SAB  0   TAB  0   Ectopic  0   Multiple      Live Births  2            Home Medications    Prior to Admission medications   Medication Sig Start Date End Date Taking? Authorizing Provider  promethazine (PHENERGAN) 25 MG tablet Take 1 tablet (25 mg total) by mouth every 6 (six) hours as needed for nausea or vomiting. 07/26/17   Donette Larry, CNM    Family History Family History  Problem Relation Age of Onset  . Cancer Paternal Grandmother   . Diabetes Paternal Grandmother     Social History Social History   Tobacco Use  . Smoking status: Unknown If Ever Smoked  . Smokeless tobacco: Never Used  Substance Use Topics  . Alcohol use: No    Comment: Socially  .  Drug use: No     Allergies   Patient has no known allergies.   Review of Systems Review of Systems  Constitutional: Negative for chills and fever.  Respiratory: Negative for shortness of breath.   Cardiovascular: Negative for chest pain.  Gastrointestinal: Positive for abdominal pain, diarrhea, nausea and vomiting. Negative for blood in stool and constipation.  Genitourinary: Negative for dysuria, hematuria, vaginal bleeding and vaginal discharge.  Musculoskeletal: Negative for arthralgias and myalgias.  Skin: Negative for color change.  Allergic/Immunologic: Negative for immunocompromised state.  Neurological: Negative  for weakness and numbness.  Psychiatric/Behavioral: Negative for confusion.   All other systems reviewed and are negative for acute change except as noted in the HPI.    Physical Exam Updated Vital Signs BP 104/64 (BP Location: Right Arm)   Pulse 67   Temp 98.1 F (36.7 C) (Oral)   Resp 18   Ht 5\' 5"  (1.651 m)   Wt 59 kg (130 lb)   LMP 12/24/2017   SpO2 100%   Breastfeeding? Unknown   BMI 21.63 kg/m   Physical Exam  Constitutional: She is oriented to person, place, and time. Vital signs are normal. She appears well-developed and well-nourished.  Non-toxic appearance. No distress.  Afebrile, nontoxic, NAD  HENT:  Head: Normocephalic and atraumatic.  Mouth/Throat: Oropharynx is clear and moist and mucous membranes are normal.  Eyes: Conjunctivae and EOM are normal. Right eye exhibits no discharge. Left eye exhibits no discharge.  Neck: Normal range of motion. Neck supple.  Cardiovascular: Normal rate, regular rhythm, normal heart sounds and intact distal pulses. Exam reveals no gallop and no friction rub.  No murmur heard. Pulmonary/Chest: Effort normal and breath sounds normal. No respiratory distress. She has no decreased breath sounds. She has no wheezes. She has no rhonchi. She has no rales.  Abdominal: Soft. Normal appearance and bowel sounds are normal. She exhibits no distension. There is tenderness in the epigastric area and left upper quadrant. There is no rigidity, no rebound, no guarding, no CVA tenderness, no tenderness at McBurney's point and negative Murphy's sign.  Soft, nondistended, +BS throughout, with mild epigastric and LUQ TTP, no r/g/r, neg murphy's, neg mcburney's, no CVA TTP   Musculoskeletal: Normal range of motion.  Neurological: She is alert and oriented to person, place, and time. She has normal strength. No sensory deficit.  Skin: Skin is warm, dry and intact. No rash noted.  Psychiatric: She has a normal mood and affect.  Nursing note and vitals  reviewed.    ED Treatments / Results  Labs (all labs ordered are listed, but only abnormal results are displayed) Labs Reviewed  COMPREHENSIVE METABOLIC PANEL - Abnormal; Notable for the following components:      Result Value   Glucose, Bld 102 (*)    AST 14 (*)    ALT 13 (*)    All other components within normal limits  URINALYSIS, ROUTINE W REFLEX MICROSCOPIC - Abnormal; Notable for the following components:   APPearance CLOUDY (*)    Specific Gravity, Urine >1.030 (*)    Hgb urine dipstick LARGE (*)    Ketones, ur 15 (*)    Protein, ur 30 (*)    All other components within normal limits  URINALYSIS, MICROSCOPIC (REFLEX) - Abnormal; Notable for the following components:   Bacteria, UA MANY (*)    All other components within normal limits  LIPASE, BLOOD  CBC  PREGNANCY, URINE    EKG None  Radiology No results found.  Procedures  Procedures (including critical care time)  Medications Ordered in ED Medications  sodium chloride 0.9 % bolus 1,000 mL (1,000 mLs Intravenous New Bag/Given 12/24/17 1830)  ondansetron (ZOFRAN) injection 4 mg (4 mg Intravenous Given 12/24/17 1830)  dicyclomine (BENTYL) capsule 20 mg (20 mg Oral Given 12/24/17 1830)  gi cocktail (Maalox,Lidocaine,Donnatal) (30 mLs Oral Given 12/24/17 1830)  famotidine (PEPCID) IVPB 20 mg premix (20 mg Intravenous New Bag/Given 12/24/17 1828)     Initial Impression / Assessment and Plan / ED Course  I have reviewed the triage vital signs and the nursing notes.  Pertinent labs & imaging results that were available during my care of the patient were reviewed by me and considered in my medical decision making (see chart for details).     27 y.o. female here with epigastric pain x4 days with diarrhea, and n/v x1 today. On exam, mild epigastric/LUQ TTP, nonperitoneal, no RUQ tenderness and no lower abd TTP, no flank tenderness. Afebrile and nontoxic appearing. Work up thus far reveals: lipase WNL; CMP WNL; CBC WNL.  Awaiting U/A and Upreg. Doubt gallbladder pathology, doubt pancreatitis, could be GERD/PUD/gastritis vs viral gastroenteritis but overall pt well appearing with reassuring labs thus far, so doubt need for imaging at this time. Doubt need for pelvic exam. Will give fluids, zofran, bentyl, GI cocktail, and pepcid, and await U/A and Upreg, then reassess shortly.  7:28 PM U/A with gross contamination, 6-10 squamous cells and mucus present, also without nitrites or leuks, has >50 hgb likely from vaginal contamination since she's on her menses, only 6-10 WBCs which is likely from vaginal contamination; doubt UTI, and doubt UCx would be beneficial in this sample. Upreg neg. Pt feeling better and tolerating PO well. Symptoms consistent with gastritis/GERD/PUD vs viral gastroenteritis. Discussed diet/lifestyle modifications for symptoms, BRAT diet, will start on zantac/zofran/bentyl, advised tylenol and avoidance/sparing use of NSAIDs only on full stomach, discussed other OTC remedies for symptomatic relief, and f/up with PCP in 5-7 days for recheck of symptoms and ongoing evaluation/management. I explained the diagnosis and have given explicit precautions to return to the ER including for any other new or worsening symptoms. The patient understands and accepts the medical plan as it's been dictated and I have answered their questions. Discharge instructions concerning home care and prescriptions have been given. The patient is STABLE and is discharged to home in good condition.    Final Clinical Impressions(s) / ED Diagnoses   Final diagnoses:  Upper abdominal pain  Nausea vomiting and diarrhea  Viral gastroenteritis  Gastritis, presence of bleeding unspecified, unspecified chronicity, unspecified gastritis type    ED Discharge Orders        Ordered    ondansetron (ZOFRAN ODT) 4 MG disintegrating tablet  Every 8 hours PRN     12/24/17 1925    dicyclomine (BENTYL) 20 MG tablet  3 times daily with meals PRN      12/24/17 1925    ranitidine (ZANTAC) 150 MG tablet  2 times daily     12/24/17 [address]       Luanna Weesner, CarterMercedes, [place]PA-C 12/24/17 1928    Charlynne PanderYao, David Hsienta, MD 12/24/17 [phone]

## 2017-12-24 NOTE — Discharge Instructions (Addendum)
Your work up today has been reassuring. Your abdominal pain is likely from either a viral gastroenteritis, or potentially from indigestion/gastritis or an ulcer. You will need to take zantac as directed, and avoid spicy/fatty/acidic foods, avoid soda/coffee/tea/alcohol. Avoid laying down flat within 30 minutes of eating. Avoid NSAIDs like ibuprofen/aleve/motrin/etc on an empty stomach. May consider using over the counter tums, maalox, pepto bismol, or other over the counter remedies to help with symptoms. Use zofran as directed as needed for nausea. Use tylenol as needed for pain. Take bentyl as directed as needed for abdominal pain and diarrhea. Stay well hydrated with small sips of fluids throughout the day. Follow a BRAT (banana-rice-applesauce-toast) diet as described below for the next 24-48 hours. The 'BRAT' diet is suggested, then progress to diet as tolerated as symptoms abate. Call your regular doctor if bloody stools, persistent diarrhea, vomiting, fever or abdominal pain. Follow up with your regular doctor in 5-7 days for recheck of symptoms. Return to ER for changing or worsening of symptoms.  Follow up with the gastroenterologist/your regular doctor in 5-7 days for recheck of symptoms. Return to the ER for changes or worsening symptoms.  Abdominal (belly) pain can be caused by many things. Your caregiver performed an examination and possibly ordered blood/urine tests and imaging (CT scan, x-rays, ultrasound). Many cases can be observed and treated at home after initial evaluation in the emergency department. Even though you are being discharged home, abdominal pain can be unpredictable. Therefore, you need a repeated exam if your pain does not resolve, returns, or worsens. Most patients with abdominal pain don't have to be admitted to the hospital or have surgery, but serious problems like appendicitis and gallbladder attacks can start out as nonspecific pain. Many abdominal conditions cannot be  diagnosed in one visit, so follow-up evaluations are very important. SEEK IMMEDIATE MEDICAL ATTENTION IF YOU DEVELOP ANY OF THE FOLLOWING SYMPTOMS: The pain does not go away or becomes severe.  A temperature above 101 develops.  Repeated vomiting occurs (multiple episodes).  The pain becomes localized to portions of the abdomen. The right side could possibly be appendicitis. In an adult, the left lower portion of the abdomen could be colitis or diverticulitis.  Blood is being passed in stools or vomit (bright red or black tarry stools).  Return also if you develop chest pain, difficulty breathing, dizziness or fainting, or become confused, poorly responsive, or inconsolable (young children). The constipation stays for more than 4 days.  There is belly (abdominal) or rectal pain.  You do not seem to be getting better.

## 2017-12-24 NOTE — ED Triage Notes (Addendum)
pateint come via EMS from an Urgent care. Reports that she has a 4 day hx of N/V/D and abdominal pain  - patient states that she was sent here because " I vomited a lot at the urgent care" - Patient is on her period she has a hx of an abortion about 2 months ago

## 2018-06-27 ENCOUNTER — Inpatient Hospital Stay (HOSPITAL_BASED_OUTPATIENT_CLINIC_OR_DEPARTMENT_OTHER): Payer: Medicaid Other

## 2018-06-27 ENCOUNTER — Encounter (HOSPITAL_COMMUNITY): Payer: Self-pay | Admitting: Student

## 2018-06-27 ENCOUNTER — Inpatient Hospital Stay (HOSPITAL_COMMUNITY)
Admission: AD | Admit: 2018-06-27 | Discharge: 2018-06-29 | DRG: 833 | Disposition: A | Discharge status: Home / Self Care | Payer: Medicaid Other | Attending: Obstetrics and Gynecology | Admitting: Obstetrics and Gynecology

## 2018-06-27 ENCOUNTER — Other Ambulatory Visit: Payer: Self-pay

## 2018-06-27 DIAGNOSIS — Z363 Encounter for antenatal screening for malformations: Secondary | ICD-10-CM

## 2018-06-27 DIAGNOSIS — Z3A26 26 weeks gestation of pregnancy: Secondary | ICD-10-CM

## 2018-06-27 DIAGNOSIS — Z3686 Encounter for antenatal screening for cervical length: Secondary | ICD-10-CM

## 2018-06-27 DIAGNOSIS — O26872 Cervical shortening, second trimester: Secondary | ICD-10-CM

## 2018-06-27 DIAGNOSIS — O0990 Supervision of high risk pregnancy, unspecified, unspecified trimester: Secondary | ICD-10-CM

## 2018-06-27 DIAGNOSIS — O0932 Supervision of pregnancy with insufficient antenatal care, second trimester: Secondary | ICD-10-CM

## 2018-06-27 DIAGNOSIS — R8789 Other abnormal findings in specimens from female genital organs: Secondary | ICD-10-CM

## 2018-06-27 DIAGNOSIS — O09892 Supervision of other high risk pregnancies, second trimester: Secondary | ICD-10-CM | POA: Diagnosis not present

## 2018-06-27 DIAGNOSIS — O26892 Other specified pregnancy related conditions, second trimester: Secondary | ICD-10-CM

## 2018-06-27 DIAGNOSIS — R109 Unspecified abdominal pain: Secondary | ICD-10-CM

## 2018-06-27 DIAGNOSIS — O0933 Supervision of pregnancy with insufficient antenatal care, third trimester: Secondary | ICD-10-CM

## 2018-06-27 DIAGNOSIS — R1030 Lower abdominal pain, unspecified: Secondary | ICD-10-CM | POA: Diagnosis present

## 2018-06-27 DIAGNOSIS — O09899 Supervision of other high risk pregnancies, unspecified trimester: Secondary | ICD-10-CM

## 2018-06-27 LAB — WET PREP, GENITAL
Sperm: NONE SEEN
TRICH WET PREP: NONE SEEN
Yeast Wet Prep HPF POC: NONE SEEN

## 2018-06-27 LAB — FETAL FIBRONECTIN: Fetal Fibronectin: POSITIVE — AB

## 2018-06-27 LAB — CBC
HCT: 30.6 % — ABNORMAL LOW (ref 36.0–46.0)
Hemoglobin: 10.4 g/dL — ABNORMAL LOW (ref 12.0–15.0)
MCH: 30.7 pg (ref 26.0–34.0)
MCHC: 34 g/dL (ref 30.0–36.0)
MCV: 90.3 fL (ref 80.0–100.0)
PLATELETS: 278 10*3/uL (ref 150–400)
RBC: 3.39 MIL/uL — ABNORMAL LOW (ref 3.87–5.11)
RDW: 12.5 % (ref 11.5–15.5)
WBC: 7.2 10*3/uL (ref 4.0–10.5)
nRBC: 0 % (ref 0.0–0.2)

## 2018-06-27 LAB — URINALYSIS, ROUTINE W REFLEX MICROSCOPIC
Bilirubin Urine: NEGATIVE
GLUCOSE, UA: NEGATIVE mg/dL
Ketones, ur: 5 mg/dL — AB
Nitrite: NEGATIVE
PROTEIN: 30 mg/dL — AB
Specific Gravity, Urine: 1.024 (ref 1.005–1.030)
pH: 6 (ref 5.0–8.0)

## 2018-06-27 LAB — TYPE AND SCREEN
ABO/RH(D): B POS
Antibody Screen: NEGATIVE

## 2018-06-27 LAB — RAPID HIV SCREEN (HIV 1/2 AB+AG)
HIV 1/2 ANTIBODIES: NONREACTIVE
HIV-1 P24 Antigen - HIV24: NONREACTIVE

## 2018-06-27 MED ORDER — MAGNESIUM SULFATE 40 G IN LACTATED RINGERS - SIMPLE
2.0000 g/h | INTRAVENOUS | Status: AC
  Administered 2018-06-27 – 2018-06-28 (×2): 2 g/h via INTRAVENOUS
  Filled 2018-06-27 (×2): qty 500

## 2018-06-27 MED ORDER — LACTATED RINGERS IV BOLUS: 1000.0000 mL | Freq: Once | INTRAVENOUS | Status: DC

## 2018-06-27 MED ORDER — PRENATAL MULTIVITAMIN CH
1.0000 | ORAL_TABLET | Freq: Every day | ORAL | Status: DC
  Filled 2018-06-27: qty 1

## 2018-06-27 MED ORDER — CALCIUM CARBONATE ANTACID 500 MG PO CHEW: 2.0000 | CHEWABLE_TABLET | ORAL | Status: DC | PRN

## 2018-06-27 MED ORDER — LACTATED RINGERS IV SOLN
INTRAVENOUS | Status: DC
  Administered 2018-06-27: 22:00:00 via INTRAVENOUS

## 2018-06-27 MED ORDER — NIFEDIPINE 10 MG PO CAPS
10.0000 mg | ORAL_CAPSULE | ORAL | Status: DC | PRN
  Administered 2018-06-27: 10 mg via ORAL
  Filled 2018-06-27: qty 1

## 2018-06-27 MED ORDER — BETAMETHASONE SOD PHOS & ACET 6 (3-3) MG/ML IJ SUSP
12.0000 mg | Freq: Once | INTRAMUSCULAR | Status: DC
  Administered 2018-06-27: 12 mg via INTRAMUSCULAR
  Filled 2018-06-27: qty 2

## 2018-06-27 MED ORDER — BETAMETHASONE SOD PHOS & ACET 6 (3-3) MG/ML IJ SUSP
12.0000 mg | INTRAMUSCULAR | Status: DC
  Administered 2018-06-28: 12 mg via INTRAMUSCULAR
  Filled 2018-06-27: qty 2

## 2018-06-27 MED ORDER — ACETAMINOPHEN 325 MG PO TABS
650.0000 mg | ORAL_TABLET | ORAL | Status: DC | PRN
  Administered 2018-06-29: 650 mg via ORAL
  Filled 2018-06-27: qty 2

## 2018-06-27 MED ORDER — MAGNESIUM SULFATE BOLUS VIA INFUSION
4.0000 g | Freq: Once | INTRAVENOUS | Status: AC
  Administered 2018-06-27: 4 g via INTRAVENOUS
  Filled 2018-06-27: qty 500

## 2018-06-27 NOTE — H&P (Signed)
Attestation signed by [place] Bing, MD at 06/27/2018 10:27 PM  Attestation of Attending Supervision of Nurse Practitioner: Evaluation and management procedures were performed by the NP under my supervision.  I have seen and examined the patient,  reviewed the NP's note and chart, and I agree with the management and plan.   Given her h/o PTL with her last pregnancy (delivered at term), borderline low cx length, +FFN and contraction pattern and patient still endorsing q3-48m UCs, recommend bmz course, Mg and admit to antepartum. Pt amenable to plan. Start abx if cx changes  Cornelia Copa MD Attending [facility])      Expand All Collapse All    Show:Clear all [x] Manual[x] Template[] Copied  Added by: [x] Judeth Horn, NP  [] Hover for details Chief Complaint: Abdominal Pain; Back Pain; and Vaginal Discharge   First Provider Initiated Contact with Patient 06/27/18 1852     SUBJECTIVE HPI: Erica Clements is a 27 y.o. [id number] at [redacted]w[redacted]d by LMP who presents to Maternity Admissions reporting abdominal pain. Has not had prenatal care with this pregnancy. Symptoms began this morning. Reports intermittent sharp lower abdominal pains that occur with walking and movement. Also reports intermittent abdominal & low back pain that is worse with lying down. That pain occurs every 3-5 minutes.  Denies n/v/d, dysuria, vaginal bleeding, or LOF. No recent intercourse.  Has had increase in discharge for the last week. Yellow discharge with foul odor. No itching or irritation.   Location: abdomen & back Quality: cramping, sharp Severity: 7/10 on pain scale Duration: 1 day Timing: every 3-5 minutes Modifying factors: ctx worse when lying down. Sharp pains worse with walking & movement Associated signs and symptoms: vaginal discharge      Past Medical History:  Diagnosis Date  . Medical history non-contributory    OB History  Gravida Para Term  Preterm AB Living  4 2 2  0 1 2  SAB TAB Ectopic Multiple Live Births     1 0 0   2       # Outcome Date GA Lbr Len/2nd Weight Sex Delivery Anes PTL Lv  4 Current           3 SAB 2018          2 Term 11/25/15 [redacted]w[redacted]d 08:07 / 00:10 4156 g M Vag-Spont EPI  LIV  1 Term 02/01/14 [redacted]w[redacted]d 309:30 / 00:36 4082 g M Vag-Spont EPI  LIV        Past Surgical History:  Procedure Laterality Date  . FEMUR IM NAIL Left 09/12/2016   Procedure: INTRAMEDULLARY (IM) NAIL FEMORAL;  Surgeon: Nadara Mustard, MD;  [facility];  Service: Orthopedics;  Laterality: Left;   Social History        Socioeconomic History  . Marital status: Single    Spouse name: Not on file  . Number of children: Not on file  . Years of education: Not on file  . Highest education level: Not on file  Occupational History  . Not on file  Social Needs  . Financial resource strain: Not on file  . Food insecurity:    Worry: Not on file    Inability: Not on file  . Transportation needs:    Medical: Not on file    Non-medical: Not on file  Tobacco Use  . Smoking status: Never Smoker  . Smokeless tobacco: Never Used  Substance and Sexual Activity  . Alcohol use: Not Currently    Comment: Socially  .  Drug use: No  . Sexual activity: Yes  Lifestyle  . Physical activity:    Days per week: Not on file    Minutes per session: Not on file  . Stress: Not on file  Relationships  . Social connections:    Talks on phone: Not on file    Gets together: Not on file    Attends religious service: Not on file    Active member of club or organization: Not on file    Attends meetings of clubs or organizations: Not on file    Relationship status: Not on file  . Intimate partner violence:    Fear of current or ex partner: Not on file    Emotionally abused: Not on file    Physically abused: Not on file    Forced sexual activity: Not on file  Other Topics Concern  . Not on  file  Social History Narrative   ** Merged History Encounter **            Family History  Problem Relation Age of Onset  . Cancer Paternal Grandmother   . Diabetes Paternal Grandmother    No current facility-administered medications on file prior to encounter.          Current Outpatient Medications on File Prior to Encounter  Medication Sig Dispense Refill  . dicyclomine (BENTYL) 20 MG tablet Take 1 tablet (20 mg total) by mouth 3 (three) times daily with meals as needed for spasms (abdominal pain and diarrhea). 20 tablet 0  . ondansetron (ZOFRAN ODT) 4 MG disintegrating tablet Take 1 tablet (4 mg total) by mouth every 8 (eight) hours as needed for nausea or vomiting. 15 tablet 0  . promethazine (PHENERGAN) 25 MG tablet Take 1 tablet (25 mg total) by mouth every 6 (six) hours as needed for nausea or vomiting. 30 tablet 0  . ranitidine (ZANTAC) 150 MG tablet Take 1 tablet (150 mg total) by mouth 2 (two) times daily. 60 tablet 0   No Known Allergies  I have reviewed patient's Past Medical Hx, Surgical Hx, Family Hx, Social Hx, medications and allergies.   Review of Systems  Constitutional: Negative.   Gastrointestinal: Positive for abdominal pain. Negative for constipation, diarrhea, nausea and vomiting.  Genitourinary: Positive for vaginal discharge. Negative for dysuria and vaginal bleeding.  Musculoskeletal: Positive for back pain.    OBJECTIVE Patient Vitals for the past 24 hrs:  BP Temp Temp src Pulse Resp SpO2 Weight  06/27/18 1950 102/60 - - - - - -  06/27/18 1813 103/62 98.2 F (36.8 C) Oral 97 17 100 % 67.9 kg   Constitutional: Well-developed, well-nourished female in no acute distress.  Cardiovascular: normal rate & rhythm, no murmur Respiratory: normal rate and effort. Lung sounds clear throughout GI: Abd soft, non-tender, Pos BS x 4. No guarding or rebound tenderness. Fundal height 28 cm MS: Extremities nontender, no edema, normal ROM Neurologic:  Alert and oriented x 4.  GU: Small amount of thin white discharge.      Dilation: 1 Effacement (%): 50 Station: Ballotable Presentation: Vertex Exam by:: Judeth HornErin Lawrence, NP  NST:  Baseline: 140 bpm, Variability: Good {> 6 bpm), Accelerations: Reactive, Decelerations: Absent and uterine irritability               LAB RESULTS LabResultsLast24Hours       Results for orders placed or performed during the hospital encounter of 06/27/18 (from the past 24 hour(s))  Urinalysis, Routine w reflex microscopic  Status: Abnormal   Collection Time: 06/27/18  5:59 PM  Result Value Ref Range   Color, Urine YELLOW YELLOW   APPearance CLOUDY (A) CLEAR   Specific Gravity, Urine 1.024 1.005 - 1.030   pH 6.0 5.0 - 8.0   Glucose, UA NEGATIVE NEGATIVE mg/dL   Hgb urine dipstick SMALL (A) NEGATIVE   Bilirubin Urine NEGATIVE NEGATIVE   Ketones, ur 5 (A) NEGATIVE mg/dL   Protein, ur 30 (A) NEGATIVE mg/dL   Nitrite NEGATIVE NEGATIVE   Leukocytes, UA LARGE (A) NEGATIVE   RBC / HPF 0-5 0 - 5 RBC/hpf   WBC, UA 0-5 0 - 5 WBC/hpf   Bacteria, UA RARE (A) NONE SEEN   Squamous Epithelial / LPF 21-50 0 - 5   Mucus PRESENT   Wet prep, genital     Status: Abnormal   Collection Time: 06/27/18  7:38 PM  Result Value Ref Range   Yeast Wet Prep HPF POC NONE SEEN NONE SEEN   Trich, Wet Prep NONE SEEN NONE SEEN   Clue Cells Wet Prep HPF POC PRESENT (A) NONE SEEN   WBC, Wet Prep HPF POC MANY (A) NONE SEEN   Sperm NONE SEEN   Fetal fibronectin     Status: Abnormal   Collection Time: 06/27/18  7:38 PM  Result Value Ref Range   Fetal Fibronectin POSITIVE (A) NEGATIVE      IMAGING No results found.  MAU COURSE    Orders Placed This Encounter  Procedures  . Culture, OB Urine  . Wet prep, genital  . Culture, beta strep (group b only)  . Urine culture  . US MFM OB Transvaginal  . US MFM OB DETAIL +14 WK  . Urinalysis, Routine w reflex microscopic  . Fetal fibronectin    . RPR  . Rapid HIV screen (HIV 1/2 Ab+Ag)  . Hepatitis B surface antigen  . CBC  . Rubella screen  . Urine rapid drug screen (hosp performed)  . Diet clear liquid Room service appropriate? Yes; Fluid consistency: Thin  . Notify physician (specify)  . Vital signs  . Defer vaginal exam for vaginal bleeding or PROM <37 weeks  . Initiate Oral Care Protocol  . Initiate Carrier Fluid Protocol  . SCDs  . Fetal monitoring  . Continuous tocometry  . Bed rest with bathroom privileges  . Full code  . Type and [facility]  . Insert peripheral IV  . Admit to Inpatient (patient's expected length of stay will be greater than 2 midnights or inpatient only procedure)      Meds ordered this encounter  Medications  . NIFEdipine (PROCARDIA) capsule 10 mg  . DISCONTD: betamethasone acetate-betamethasone sodium phosphate (CELESTONE) injection 12 mg  . betamethasone acetate-betamethasone sodium phosphate (CELESTONE) injection 12 mg  . acetaminophen (TYLENOL) tablet 650 mg  . prenatal multivitamin tablet 1 tablet  . lactated ringers infusion  . calcium carbonate (TUMS - dosed in mg elemental calcium) chewable tablet 400 mg of elemental calcium  . magnesium bolus via infusion 4 g  . magnesium sulfate 40 grams in LR 500 mL OB infusion  . lactated ringers bolus 1,000 mL    MDM Fetal tracing appropriate for gestation; some UI on monitor. Abdomen soft & non tender. PO fluids & procardia ordered Cervix 1/50/ballotable -- unchanged after 1 hr of monitoring. FFN positive Ultrasound shows cervical length of 2.1 cm BMZ ordered to be given in MAU C/w Dr. Vergie LivingPickens. [facility].   ASSESSMENT 1.  Positive fetal fibronectin at 22 weeks to [redacted] weeks gestation   2. [redacted] weeks gestation of pregnancy   3. Abdominal pain during pregnancy, second trimester   4. Supervision of high-risk pregnancy   5. No prenatal care in current pregnancy in second trimester     PLAN Admit  to [facility] unit Orders to be placed by Dr. Lenor Coffin, Denny Peon, NP 06/27/2018  8:57 PM

## 2018-06-27 NOTE — MAU Note (Addendum)
Sharp pains in lower abd, started this morning.  Pain comes when stands and walks.  Having low back pain, this is more constant esp when laying down. No bleeding, some d/c- ? Bv. Denies GI or GU complaints.

## 2018-06-27 NOTE — MAU Provider Note (Signed)
Chief Complaint: Abdominal Pain; Back Pain; and Vaginal Discharge   First Provider Initiated Contact with Patient 06/27/18 1852     SUBJECTIVE HPI: Erica Clements is a 27 y.o. [id number] at [redacted]w[redacted]d by LMP who presents to Maternity Admissions reporting abdominal pain. Has not had prenatal care with this pregnancy. Symptoms began this morning. Reports intermittent sharp lower abdominal pains that occur with walking and movement. Also reports intermittent abdominal & low back pain that is worse with lying down. That pain occurs every 3-5 minutes.  Denies n/v/d, dysuria, vaginal bleeding, or LOF. No recent intercourse.  Has had increase in discharge for the last week. Yellow discharge with foul odor. No itching or irritation.   Location: abdomen & back Quality: cramping, sharp Severity: 7/10 on pain scale Duration: 1 day Timing: every 3-5 minutes Modifying factors: ctx worse when lying down. Sharp pains worse with walking & movement Associated signs and symptoms: vaginal discharge  Past Medical History:  Diagnosis Date  . Medical history non-contributory    OB History  Gravida Para Term Preterm AB Living  4 2 2  0 1 2  SAB TAB Ectopic Multiple Live Births  1 0 0   2    # Outcome Date GA Lbr Len/2nd Weight Sex Delivery Anes PTL Lv  4 Current           3 SAB 2018          2 Term 11/25/15 [redacted]w[redacted]d 08:07 / 00:10 4156 g M Vag-Spont EPI  LIV  1 Term 02/01/14 [redacted]w[redacted]d 309:30 / 00:36 4082 g M Vag-Spont EPI  LIV   Past Surgical History:  Procedure Laterality Date  . FEMUR IM NAIL Left 09/12/2016   Procedure: INTRAMEDULLARY (IM) NAIL FEMORAL;  Surgeon: Nadara Mustard, MD;  Location: [facility];  Service: Orthopedics;  Laterality: Left;   Social History   Socioeconomic History  . Marital status: Single    Spouse name: Not on file  . Number of children: Not on file  . Years of education: Not on file  . Highest education level: Not on file  Occupational History  . Not on file  Social Needs  . Financial  resource strain: Not on file  . Food insecurity:    Worry: Not on file    Inability: Not on file  . Transportation needs:    Medical: Not on file    Non-medical: Not on file  Tobacco Use  . Smoking status: Never Smoker  . Smokeless tobacco: Never Used  Substance and Sexual Activity  . Alcohol use: Not Currently    Comment: Socially  . Drug use: No  . Sexual activity: Yes  Lifestyle  . Physical activity:    Days per week: Not on file    Minutes per session: Not on file  . Stress: Not on file  Relationships  . Social connections:    Talks on phone: Not on file    Gets together: Not on file    Attends religious service: Not on file    Active member of club or organization: Not on file    Attends meetings of clubs or organizations: Not on file    Relationship status: Not on file  . Intimate partner violence:    Fear of current or ex partner: Not on file    Emotionally abused: Not on file    Physically abused: Not on file    Forced sexual activity: Not on file  Other Topics Concern  . Not on file  Social History Narrative   ** Merged History Encounter **       Family History  Problem Relation Age of Onset  . Cancer Paternal Grandmother   . Diabetes Paternal Grandmother    No current facility-administered medications on file prior to encounter.    Current Outpatient Medications on File Prior to Encounter  Medication Sig Dispense Refill  . dicyclomine (BENTYL) 20 MG tablet Take 1 tablet (20 mg total) by mouth 3 (three) times daily with meals as needed for spasms (abdominal pain and diarrhea). 20 tablet 0  . ondansetron (ZOFRAN ODT) 4 MG disintegrating tablet Take 1 tablet (4 mg total) by mouth every 8 (eight) hours as needed for nausea or vomiting. 15 tablet 0  . promethazine (PHENERGAN) 25 MG tablet Take 1 tablet (25 mg total) by mouth every 6 (six) hours as needed for nausea or vomiting. 30 tablet 0  . ranitidine (ZANTAC) 150 MG tablet Take 1 tablet (150 mg total) by  mouth 2 (two) times daily. 60 tablet 0   No Known Allergies  I have reviewed patient's Past Medical Hx, Surgical Hx, Family Hx, Social Hx, medications and allergies.   Review of Systems  Constitutional: Negative.   Gastrointestinal: Positive for abdominal pain. Negative for constipation, diarrhea, nausea and vomiting.  Genitourinary: Positive for vaginal discharge. Negative for dysuria and vaginal bleeding.  Musculoskeletal: Positive for back pain.    OBJECTIVE Patient Vitals for the past 24 hrs:  BP Temp Temp src Pulse Resp SpO2 Weight  06/27/18 1950 102/60 - - - - - -  06/27/18 1813 103/62 98.2 F (36.8 C) Oral 97 17 100 % 67.9 kg   Constitutional: Well-developed, well-nourished female in no acute distress.  Cardiovascular: normal rate & rhythm, no murmur Respiratory: normal rate and effort. Lung sounds clear throughout GI: Abd soft, non-tender, Pos BS x 4. No guarding or rebound tenderness. Fundal height 28 cm MS: Extremities nontender, no edema, normal ROM Neurologic: Alert and oriented x 4.  GU: Small amount of thin white discharge.      Dilation: 1 Effacement (%): 50 Station: Ballotable Presentation: Vertex Exam by:: Judeth HornErin Margarette Vannatter, NP  NST:  Baseline: 140 bpm, Variability: Good {> 6 bpm), Accelerations: Reactive, Decelerations: Absent and uterine irritability    LAB RESULTS Results for orders placed or performed during the hospital encounter of 06/27/18 (from the past 24 hour(s))  Urinalysis, Routine w reflex microscopic     Status: Abnormal   Collection Time: 06/27/18  5:59 PM  Result Value Ref Range   Color, Urine YELLOW YELLOW   APPearance CLOUDY (A) CLEAR   Specific Gravity, Urine 1.024 1.005 - 1.030   pH 6.0 5.0 - 8.0   Glucose, UA NEGATIVE NEGATIVE mg/dL   Hgb urine dipstick SMALL (A) NEGATIVE   Bilirubin Urine NEGATIVE NEGATIVE   Ketones, ur 5 (A) NEGATIVE mg/dL   Protein, ur 30 (A) NEGATIVE mg/dL   Nitrite NEGATIVE NEGATIVE   Leukocytes, UA LARGE (A)  NEGATIVE   RBC / HPF 0-5 0 - 5 RBC/hpf   WBC, UA 0-5 0 - 5 WBC/hpf   Bacteria, UA RARE (A) NONE SEEN   Squamous Epithelial / LPF 21-50 0 - 5   Mucus PRESENT   Wet prep, genital     Status: Abnormal   Collection Time: 06/27/18  7:38 PM  Result Value Ref Range   Yeast Wet Prep HPF POC NONE SEEN NONE SEEN   Trich, Wet Prep NONE SEEN NONE SEEN   Clue Cells Wet  Prep HPF POC PRESENT (A) NONE SEEN   WBC, Wet Prep HPF POC MANY (A) NONE SEEN   Sperm NONE SEEN   Fetal fibronectin     Status: Abnormal   Collection Time: 06/27/18  7:38 PM  Result Value Ref Range   Fetal Fibronectin POSITIVE (A) NEGATIVE    IMAGING No results found.  MAU COURSE Orders Placed This Encounter  Procedures  . Culture, OB Urine  . Wet prep, genital  . Culture, beta strep (group b only)  . Urine culture  . US MFM OB Transvaginal  . US MFM OB DETAIL +14 WK  . Urinalysis, Routine w reflex microscopic  . Fetal fibronectin  . RPR  . Rapid HIV screen (HIV 1/2 Ab+Ag)  . Hepatitis B surface antigen  . CBC  . Rubella screen  . Urine rapid drug screen (hosp performed)  . Diet clear liquid Room service appropriate? Yes; Fluid consistency: Thin  . Notify physician (specify)  . Vital signs  . Defer vaginal exam for vaginal bleeding or PROM <37 weeks  . Initiate Oral Care Protocol  . Initiate Carrier Fluid Protocol  . SCDs  . Fetal monitoring  . Continuous tocometry  . Bed rest with bathroom privileges  . Full code  . Type and screen [facility]  . Insert peripheral IV  . Admit to Inpatient (patient's expected length of stay will be greater than 2 midnights or inpatient only procedure)   Meds ordered this encounter  Medications  . NIFEdipine (PROCARDIA) capsule 10 mg  . DISCONTD: betamethasone acetate-betamethasone sodium phosphate (CELESTONE) injection 12 mg  . betamethasone acetate-betamethasone sodium phosphate (CELESTONE) injection 12 mg  . acetaminophen (TYLENOL) tablet 650 mg  .  prenatal multivitamin tablet 1 tablet  . lactated ringers infusion  . calcium carbonate (TUMS - dosed in mg elemental calcium) chewable tablet 400 mg of elemental calcium  . magnesium bolus via infusion 4 g  . magnesium sulfate 40 grams in LR 500 mL OB infusion  . lactated ringers bolus 1,000 mL    MDM Fetal tracing appropriate for gestation; some UI on monitor. Abdomen soft & non tender. PO fluids & procardia ordered Cervix 1/50/ballotable -- unchanged after 1 hr of monitoring. FFN positive Ultrasound shows cervical length of 2.1 cm BMZ ordered to be given in MAU C/w Dr. Vergie LivingPickens. [facility].   ASSESSMENT 1. Positive fetal fibronectin at 22 weeks to [redacted] weeks gestation   2. [redacted] weeks gestation of pregnancy   3. Abdominal pain during pregnancy, second trimester   4. Supervision of high-risk pregnancy   5. No prenatal care in current pregnancy in second trimester     [facility] Orders to be placed by Dr. Lenor CoffinPickens  Jaydence Arnesen, Denny PeonErin, NP 06/27/2018  8:57 PM

## 2018-06-28 LAB — HEPATITIS B SURFACE ANTIGEN: Hepatitis B Surface Ag: NEGATIVE

## 2018-06-28 LAB — RAPID URINE DRUG SCREEN, HOSP PERFORMED
Amphetamines: NOT DETECTED
Barbiturates: NOT DETECTED
Benzodiazepines: NOT DETECTED
Cocaine: NOT DETECTED
Opiates: NOT DETECTED
Tetrahydrocannabinol: NOT DETECTED

## 2018-06-28 MED ORDER — NIFEDIPINE ER OSMOTIC RELEASE 30 MG PO TB24
30.0000 mg | ORAL_TABLET | Freq: Two times a day (BID) | ORAL | Status: DC
  Administered 2018-06-28 – 2018-06-29 (×2): 30 mg via ORAL
  Filled 2018-06-28 (×2): qty 1

## 2018-06-28 MED ORDER — ZOLPIDEM TARTRATE 5 MG PO TABS
5.0000 mg | ORAL_TABLET | Freq: Every evening | ORAL | Status: DC | PRN
  Administered 2018-06-28 – 2018-06-29 (×2): 5 mg via ORAL
  Filled 2018-06-28 (×2): qty 1

## 2018-06-28 NOTE — Progress Notes (Signed)
From 2052-2108 fetal monitoring picked up and recorded maternal pulse instead of fetal heart rate; unwitnessed by this RN.  Resolved without intervention.

## 2018-06-28 NOTE — Progress Notes (Signed)
I received a referral from pt's RN due to pt being tearful being in the hospital over Christmas.  The patient has two young children 2 and 4 and she was sad to not be with them on Christmas morning.  They are with her sister who will be bringing them up later today. She is also exhausted as she had difficulty sleeping. I offered ministry of listening and presence.    Chaplain Dyanne CarrelKaty Shaylinn Hladik, Bcc Pager, [phone] 12:26 PM    06/28/18 1200  Clinical Encounter Type  Visited With Patient  Visit Type Spiritual support  Referral From Nurse

## 2018-06-28 NOTE — Progress Notes (Signed)
Daily Antepartum Note  Admission Date: 06/27/2018 Current Date: 06/28/2018 7:51 AM  Erica Clements is a 27 y.o. [id number] @ 7886w4d (LMP=26wk u/s), HD#2, admitted for PTL.  Pregnancy complicated by: Patient Active Problem List   Diagnosis Date Noted  . Preterm labor in second trimester 06/27/2018  . No prenatal care in current pregnancy 06/27/2018  . Closed displaced transverse fracture of shaft of left femur (HCC) 09/12/2016  . Closed supracondylar fracture of left femur (HCC)   . Supervision of high risk pregnancy, antepartum 02/01/2014    Overnight/24hr events:  none  Subjective:  Patient able to sleep overnight and feels UCs infrequently  Objective:    Current Vital Signs 24h Vital Sign Ranges  T 97.8 F (36.6 C) Temp  Avg: 98.2 F (36.8 C)  Min: 97.8 F (36.6 C)  Max: 98.5 F (36.9 C)  BP (!) 96/53 BP  Min: 94/42  Max: 114/59  HR 95 Pulse  Avg: 99  Min: 94  Max: 112  RR 16 Resp  Avg: 16.8  Min: 15  Max: 19  SaO2 97 % Room Air SpO2  Avg: 98.5 %  Min: 97 %  Max: 100 %       24 Hour I/O Current Shift I/O  Time Ins Outs 12/24 0701 - 12/25 0700 In: 1858.4 [P.O.:975; I.V.:883.4] Out: 2300 [Urine:2300] No intake/output data recorded.   EFM: 125 baseline, +accels, no decels, mod variability Toco: +irritability  Physical exam: General: Well nourished, well developed female in no acute distress. Abdomen: gravid nttp GU: SVE deferred (was 1/50 x 2 in triage) Cardiovascular: S1, S2 normal, no murmur, rub or gallop, regular rate and rhythm Respiratory: CTAB Extremities: no clubbing, cyanosis or edema Skin: Warm and dry.   Medications: Current Facility-Administered Medications  Medication Dose Route Frequency Provider Last Rate Last Dose  . acetaminophen (TYLENOL) tablet 650 mg  650 mg Oral Q4H PRN [place] BingPickens, Sabastian Raimondi, MD      . betamethasone acetate-betamethasone sodium phosphate (CELESTONE) injection 12 mg  12 mg Intramuscular Q24 Hr x 2 [place] BingPickens, Cassidie Veiga, MD      . calcium  carbonate (TUMS - dosed in mg elemental calcium) chewable tablet 400 mg of elemental calcium  2 tablet Oral Q4H PRN [place] BingPickens, Steele Stracener, MD      . lactated ringers bolus 1,000 mL  1,000 mL Intravenous Once Bennet BingPickens, Shambria Camerer, MD      . lactated ringers infusion   Intravenous Continuous [place] BingPickens, Jesusita Jocelyn, MD 75 mL/hr at 06/27/18 2229    . magnesium sulfate 40 grams in LR 500 mL OB infusion  2 g/hr Intravenous Titrated [place] BingPickens, Bennie Chirico, MD 25 mL/hr at 06/27/18 2256 2 g/hr at 06/27/18 2256  . prenatal multivitamin tablet 1 tablet  1 tablet Oral Q1200 [place] BingPickens, Kaycee Mcgaugh, MD      . zolpidem (AMBIEN) tablet 5 mg  5 mg Oral QHS PRN Sharyon Cableogers, Veronica C, CNM   5 mg at 06/28/18 [id number]    Labs:  Recent Labs  Lab 06/27/18 2206  WBC 7.2  HGB 10.4*  HCT 30.6*  PLT 278    Negative: rapid hiv, hepB surf antigen Pending: GC/CT, rubella, rpr, UCx Needed: GBS swab, UDS  B POS  Radiology: prelim anatomy u/s negative, TV cx length 2.1, cephalic, efw1160gm, efw 79%, ac normal, AFI normal   Assessment & Plan:  Pt doing well *Pregnancy: routine care.  *PTL: BMZ #2 tonight at 2100, continue Mg until then. Start abx for gbs unknown PRN. Consult nicu PRN *PPx: SCDs *FEN/GI: regular diet okay,  MIVF on Mg *Dispo: likely tomorrow morning   Cornelia Copaharlie Fabian Walder, Jr. MD Attending [facility](Faculty Practice)

## 2018-06-29 DIAGNOSIS — O09892 Supervision of other high risk pregnancies, second trimester: Secondary | ICD-10-CM

## 2018-06-29 DIAGNOSIS — O0932 Supervision of pregnancy with insufficient antenatal care, second trimester: Secondary | ICD-10-CM

## 2018-06-29 LAB — RUBELLA SCREEN: Rubella: 5.25 index (ref >0.99)

## 2018-06-29 LAB — CULTURE, OB URINE

## 2018-06-29 LAB — GC/CHLAMYDIA PROBE AMP (~~LOC~~) NOT AT ARMC
Chlamydia: NEGATIVE
Neisseria Gonorrhea: NEGATIVE

## 2018-06-29 LAB — RPR: RPR Ser Ql: NONREACTIVE

## 2018-06-29 MED ORDER — PRENATAL MULTIVITAMIN CH: 1.0000 | ORAL_TABLET | Freq: Every day | ORAL | 11 refills | Status: DC

## 2018-06-29 MED ORDER — NIFEDIPINE ER 30 MG PO TB24: 30.0000 mg | ORAL_TABLET | Freq: Two times a day (BID) | ORAL | 3 refills | Status: DC

## 2018-06-29 NOTE — Progress Notes (Signed)
CSW notified by patient's RN that patient is having transportation issues and is ready for discharge.  CSW followed up with patient at bedside and discussed transportation. Patient reported that her mother will pick her up after 5PM when she gets off work. Patient reported that her mother works nearby and has no concerns that her mother will pick her up. Patient verbalized plan for her mother to provide transportation after 5 PM.   Celso SickleKimberly Mishal Probert, LCSWA Clinical Social Worker [facility] Cell#: [phone]

## 2018-06-29 NOTE — Discharge Instructions (Signed)
Preterm Labor and Birth Information °Pregnancy normally lasts 39-41 weeks. Preterm labor is when labor starts early. It starts before you have been pregnant for 37 whole weeks. °What are the risk factors for preterm labor? °Preterm labor is more likely to occur in women who: °· Have an infection while pregnant. °· Have a cervix that is short. °· Have gone into preterm labor before. °· Have had surgery on their cervix. °· Are younger than age 26. °· Are older than age 35. °· Are African American. °· Are pregnant with two or more babies. °· Take street drugs while pregnant. °· Smoke while pregnant. °· Do not gain enough weight while pregnant. °· Got pregnant right after another pregnancy. °What are the symptoms of preterm labor? °Symptoms of preterm labor include: °· Cramps. The cramps may feel like the cramps some women get during their period. The cramps may happen with watery poop (diarrhea). °· Pain in the belly (abdomen). °· Pain in the lower back. °· Regular contractions or tightening. It may feel like your belly is getting tighter. °· Pressure in the lower belly that seems to get stronger. °· More fluid (discharge) leaking from the vagina. The fluid may be watery or bloody. °· Water breaking. °Why is it important to notice signs of preterm labor? °Babies who are born early may not be fully developed. They have a higher chance for: °· Long-term heart problems. °· Long-term lung problems. °· Trouble controlling body systems, like breathing. °· Bleeding in the brain. °· A condition called cerebral palsy. °· Learning difficulties. °· Death. °These risks are highest for babies who are born before 34 weeks of pregnancy. °How is preterm labor treated? °Treatment depends on: °· How long you were pregnant. °· Your condition. °· The health of your baby. °Treatment may involve: °· Having a stitch (suture) placed in your cervix. When you give birth, your cervix opens so the baby can come out. The stitch keeps the cervix  from opening too soon. °· Staying at the hospital. °· Taking or getting medicines, such as: °? Hormone medicines. °? Medicines to stop contractions. °? Medicines to help the baby’s lungs develop. °? Medicines to prevent your baby from having cerebral palsy. °What should I do if I am in preterm labor? °If you think you are going into labor too soon, call your doctor right away. °How can I prevent preterm labor? °· Do not use any tobacco products. °? Examples of these are cigarettes, chewing tobacco, and e-cigarettes. °? If you need help quitting, ask your doctor. °· Do not use street drugs. °· Do not use any medicines unless you ask your doctor if they are safe for you. °· Talk with your doctor before taking any herbal supplements. °· Make sure you gain enough weight. °· Watch for infection. If you think you might have an infection, get it checked right away. °· If you have gone into preterm labor before, tell your doctor. °This information is not intended to replace advice given to you by your health care provider. Make sure you discuss any questions you have with your health care provider. °Document Released: 09/17/2008 Document Revised: 12/02/2015 Document Reviewed: 11/12/2015 °Elsevier Interactive Patient Education © 2019 Elsevier Inc. ° °

## 2018-06-29 NOTE — Progress Notes (Signed)
Discharge Teaching complete with pt. Pt understood all instructions and did not have any questions.

## 2018-06-29 NOTE — Care Management Note (Signed)
Case Management Note  Patient Details  Name: Erica Clements MRN: [id number] Date of Birth: Jan 03, 1991  Subjective/Objective:                    Action/Plan:  Provided patient with MATCH letter, answered questions. Patient has a follow up appointment w/in 7-10 days.  Expected Discharge Date:  06/29/18               Expected Discharge Plan:  Home/Self Care  In-House Referral:     Discharge planning Services  [facility]  Post Acute Care Choice:    Choice offered to:     DME Arranged:    DME Agency:     HH Arranged:    HH Agency:     Status of Service:  Completed, signed off  If discussed at [employer] Meetings, dates discussed:    Additional Comments:  Lawerance SabalDebbie Salvator Seppala, RN 06/29/2018, 9:55 AM

## 2018-06-29 NOTE — Discharge Summary (Signed)
Physician Discharge Summary  Patient ID: Erica Clements MRN: [id number] DOB/AGE: 26-17-1992 26 y.o.  Admit date: 06/27/2018 Discharge date: 06/29/2018  Admission Diagnoses: PTL +FFN with 2.1 cm cervix  Discharge Diagnoses:  Active Problems:   Preterm labor in second trimester   No prenatal care in current pregnancy   Discharged Condition: good  Hospital Course: responded to magnesium Received BMZ ttransitioned to procardia with good response  Consults: None  Significant Diagnostic Studies:   Treatments: magnsium BMZ  Discharge Exam: Blood pressure (!) 89/49, pulse 98, temperature 98.2 F (36.8 C), temperature source Oral, resp. rate 16, height 5\' 6"  (1.676 m), weight 67.6 kg, last menstrual period 12/24/2017, SpO2 100 %, unknown if currently breastfeeding. General appearance: alert, cooperative and no distress GI: soft, non-tender; bowel sounds normal; no masses,  no organomegaly  Disposition: Discharge disposition: 01-Home or Self Care       Discharge Instructions    Call MD for:  persistant nausea and vomiting   Complete by:  As directed    Call MD for:  severe uncontrolled pain   Complete by:  As directed    Call MD for:  temperature >100.4   Complete by:  As directed    Diet - low sodium heart healthy   Complete by:  As directed    Increase activity slowly   Complete by:  As directed    Sexual Activity Restrictions   Complete by:  As directed    No sex     Allergies as of 06/29/2018   No Known Allergies     Medication List    STOP taking these medications   dicyclomine 20 MG tablet Commonly known as:  BENTYL   ranitidine 150 MG tablet Commonly known as:  ZANTAC     TAKE these medications   NIFEdipine 30 MG 24 hr tablet Commonly known as:  ADALAT CC Take 1 tablet (30 mg total) by mouth 2 (two) times daily.   ondansetron 4 MG disintegrating tablet Commonly known as:  ZOFRAN ODT Take 1 tablet (4 mg total) by mouth every 8 (eight) hours as  needed for nausea or vomiting.   prenatal multivitamin Tabs tablet Take 1 tablet by mouth daily at 12 noon.      Follow-up Information    [place] Follow up.   Why:  she has an appointment this week Contact information: [address] [place] [address] [phone]           Signed: Lazaro ArmsLuther H Eure 06/29/2018, 7:50 AM

## 2018-06-30 LAB — CULTURE, BETA STREP (GROUP B ONLY)

## 2018-07-05 NOTE — L&D Delivery Note (Addendum)
OB/GYN Faculty Practice Delivery Note  Erica Clements is a 28 y.o. [id number] s/p SVD at [redacted]w[redacted]d. She was admitted for elective IOL.   ROM: 0h 32m with clear fluid GBS Status: negative Maximum Maternal Temperature: 98.2  Labor Progress: . Augmentation with pitocin  Delivery Date/Time: 1050 Delivery: Called to room and patient was complete and pushing. Head delivered ROA. No nuchal cord present. Shoulder and body delivered in usual fashion. Infant with spontaneous cry, dried and stimulated. Cord clamped x 2 after 1-minute delay, and cut by patient's aunt. Cord blood drawn. Placenta delivered spontaneously with gentle cord traction. Fundus firm with massage and Pitocin. Labia, perineum, vagina, and cervix inspected inspected with no lacerations present.   Placenta: delivered spontaneously, intact Complications: none Lacerations: none EBL:  Infant: vigorous female  APGARs 8 and 69  3655g  Burman Nieves, MD Family Medicine Resident   OB FELLOW DELIVERY ATTESTATION  I was gloved and present for the delivery in its entirety, and I agree with the above resident's note.    Gwenevere Abbot, MD  OB Fellow  09/23/2018, 4:51 PM

## 2018-08-21 ENCOUNTER — Inpatient Hospital Stay (HOSPITAL_COMMUNITY)
Admission: AD | Admit: 2018-08-21 | Discharge: 2018-08-21 | Disposition: A | Discharge status: Home / Self Care | Payer: Medicaid Other | Attending: Obstetrics and Gynecology | Admitting: Obstetrics and Gynecology

## 2018-08-21 ENCOUNTER — Encounter (HOSPITAL_COMMUNITY): Payer: Self-pay | Admitting: *Deleted

## 2018-08-21 ENCOUNTER — Other Ambulatory Visit: Payer: Self-pay

## 2018-08-21 DIAGNOSIS — Z3A34 34 weeks gestation of pregnancy: Secondary | ICD-10-CM

## 2018-08-21 DIAGNOSIS — O479 False labor, unspecified: Secondary | ICD-10-CM | POA: Diagnosis present

## 2018-08-21 DIAGNOSIS — O4703 False labor before 37 completed weeks of gestation, third trimester: Secondary | ICD-10-CM

## 2018-08-21 DIAGNOSIS — O4700 False labor before 37 completed weeks of gestation, unspecified trimester: Secondary | ICD-10-CM | POA: Diagnosis present

## 2018-08-21 LAB — URINALYSIS, ROUTINE W REFLEX MICROSCOPIC
Bilirubin Urine: NEGATIVE
Glucose, UA: NEGATIVE mg/dL
Hgb urine dipstick: NEGATIVE
Ketones, ur: NEGATIVE mg/dL
Nitrite: NEGATIVE
PH: 7.5 (ref 5.0–8.0)
Protein, ur: NEGATIVE mg/dL
Specific Gravity, Urine: 1.025 (ref 1.005–1.030)

## 2018-08-21 LAB — URINALYSIS, MICROSCOPIC (REFLEX)

## 2018-08-21 NOTE — MAU Provider Note (Addendum)
MAU PROVIDER PROGRESS NOTE  Erica Clements is a 28 y.o. female [phone] with IUP at [redacted]w[redacted]d presenting for contractions. She notes that she began to experience uterine contractions around 11:00 AM today.  She states that the contractions were approximately 10 minutes apart and lasted 1 minute in duration.  She denies any vaginal discharge or bleeding.    She has not had prior prenatal care for this pregnancy due to a lack of health insurance.  She was seen on 06/27/2018 due to preterm labor in second trimester.  She received magnesium, BMZ and was prescribed procardia. She denies any other complications with the pregnancy.   Patient presents with her aunt.  The patient and the aunt express the desire for the aunt to adopt the baby.  They have not previously discussed the situation with a Child psychotherapist.    Dating: By LMP --->  Estimated Date of Delivery: 09/30/18    Prenatal History/Complications:  Past Medical History: Past Medical History:  Diagnosis Date  . Medical history non-contributory     Past Surgical History: Past Surgical History:  Procedure Laterality Date  . FEMUR IM NAIL Left 09/12/2016   Procedure: INTRAMEDULLARY (IM) NAIL FEMORAL;  Surgeon: Nadara Mustard, MD;  Location: [facility] OR;  Service: Orthopedics;  Laterality: Left;    Obstetrical History: OB History    Gravida  4   Para  2   Term  2   Preterm  0   AB  1   Living  2     SAB  1   TAB  0   Ectopic  0   Multiple      Live Births  2           Social History: Social History   Socioeconomic History  . Marital status: Single    Spouse name: Not on file  . Number of children: Not on file  . Years of education: Not on file  . Highest education level: Not on file  Occupational History  . Not on file  Social Needs  . Financial resource strain: Not on file  . Food insecurity:    Worry: Not on file    Inability: Not on file  . Transportation needs:    Medical: Not on file    Non-medical: Not on  file  Tobacco Use  . Smoking status: Never Smoker  . Smokeless tobacco: Never Used  Substance and Sexual Activity  . Alcohol use: Not Currently    Comment: Socially  . Drug use: No  . Sexual activity: Yes  Lifestyle  . Physical activity:    Days per week: Not on file    Minutes per session: Not on file  . Stress: Not on file  Relationships  . Social connections:    Talks on phone: Not on file    Gets together: Not on file    Attends religious service: Not on file    Active member of club or organization: Not on file    Attends meetings of clubs or organizations: Not on file    Relationship status: Not on file  Other Topics Concern  . Not on file  Social History Narrative   ** Merged History Encounter **        Family History: Family History  Problem Relation Age of Onset  . Cancer Paternal Grandmother   . Diabetes Paternal Grandmother     Allergies: No Known Allergies  No current facility-administered medications on file prior to encounter.  Current Outpatient Medications on File Prior to Encounter  Medication Sig Dispense Refill  . Prenatal Vit-Fe Fumarate-FA (PRENATAL MULTIVITAMIN) TABS tablet Take 1 tablet by mouth daily at 12 noon. 100 tablet 11  . NIFEdipine (ADALAT CC) 30 MG 24 hr tablet Take 1 tablet (30 mg total) by mouth 2 (two) times daily. (Patient not taking: Reported on 08/21/2018) 60 tablet 3  . ondansetron (ZOFRAN ODT) 4 MG disintegrating tablet Take 1 tablet (4 mg total) by mouth every 8 (eight) hours as needed for nausea or vomiting. (Patient not taking: Reported on 06/28/2018) 15 tablet 0     Review of Systems   Constitutional: denies fever Pulmonary: denies shortness of breath Cardiac: denies chest pain Genitourinary: episodic abdominal cramping, denies vaginal discharge   Physical Exam Blood pressure 99/82, pulse 92, temperature 98.5 F (36.9 C), temperature source Oral, resp. rate 16, weight 70.2 kg, last menstrual period 12/24/2017,  SpO2 100 %, unknown if currently breastfeeding. General appearance: alert, cooperative and appears stated age Lungs: clear to auscultation bilaterally Heart: regular rate and rhythm Abdomen: soft, non-tender; bowel sounds normal Pelvic:  Extremities: Homans sign is negative, no sign of DVT Presentation: unsure Fetal monitoring Uterine activity None      Results for orders placed or performed during the hospital encounter of 08/21/18 (from the past 24 hour(s))  Urinalysis, Routine w reflex microscopic   Collection Time: 08/21/18  2:01 PM  Result Value Ref Range   Color, Urine YELLOW YELLOW   APPearance CLEAR CLEAR   Specific Gravity, Urine 1.025 1.005 - 1.030   pH 7.5 5.0 - 8.0   Glucose, UA NEGATIVE NEGATIVE mg/dL   Hgb urine dipstick NEGATIVE NEGATIVE   Bilirubin Urine NEGATIVE NEGATIVE   Ketones, ur NEGATIVE NEGATIVE mg/dL   Protein, ur NEGATIVE NEGATIVE mg/dL   Nitrite NEGATIVE NEGATIVE   Leukocytes,Ua SMALL (A) NEGATIVE  Urinalysis, Microscopic (reflex)   Collection Time: 08/21/18  2:01 PM  Result Value Ref Range   RBC / HPF 0-5 0 - 5 RBC/hpf   WBC, UA 0-5 0 - 5 WBC/hpf   Bacteria, UA FEW (A) NONE SEEN   Squamous Epithelial / LPF 0-5 0 - 5   Mucus PRESENT     Assessment/Plan:  Erica Clements is a 28 y.o. [id number] at [redacted]w[redacted]d here for uterine contractions.  Due to the lack of contractions on tocometer and lack of cervical dilation, presentation is not consistent for labor at this time.    Patient scheduled for outpatient prenatal care follow up on Tuesday.     Francene Boyers, Student-PA  08/21/2018, 3:01 PM  I confirm that I have verified the information documented in the physician assistant student's note and that I have also personally reperformed the history, physical exam and all medical decision making activities of this service and have verified that all service and findings are accurately documented in this student's note.   NOB appt scheduled with [facility]  on Tuesday 08/29/2018 @ 0830.  CSW called to speak with patient and Aunt/Adoptive mom about necessary steps to take in order to have a closed private adoption as they desire.  Raelyn Mora, CNM 08/21/2018 8:37 PM

## 2018-08-21 NOTE — MAU Note (Signed)
Having contractions a little bit.  Started around 1100, q 10 min. No bleeding or leaking.

## 2018-08-21 NOTE — Progress Notes (Signed)
CSW met with Erica Clements via bedside- aunt was present and Erica Clements stated she could stay for conversation. Erica Clements informed CSW she was wanting an adoption and aunt, Erica Clements [phone], would be the adoptive mother. Erica Clements stated she felt comfortable with this decision and is wanting a closed adoption. Aunt informed CSW that Erica Clements's father was not aware she is pregnant and this is leading her to choose adoption. Erica Clements stated FOB is not in the picture.   Aunt asked CSW questions regarding adoption procedures and what will happen day of delivery. Erica Clements stated she does not want to do skin-to-skin contact and would like aunt to be able to do skin-to-skin. CSW encouraged Erica Clements to discuss delivery plans with her OBGYN. CSW also informed Erica Clements/ aunt that adoption paperwork would need to be completed before aunt could have access to nursery/ stay with infant while in hospital. Aunt stated she has already contacted a lawyer for assistance with adoption- CSW informed aunt that both Erica Clements and aunt would have to have a Chief Executive Officer for a private adoption. Aunt also had questions regarding receiving a letter to receive a refund for a scheduled trip to [place] informed aunt a letter could not be provided at this time.   Social work department will follow up with Erica Clements once delivered.   Kingsley Spittle, [place]  [phone]

## 2018-08-29 ENCOUNTER — Ambulatory Visit (INDEPENDENT_AMBULATORY_CARE_PROVIDER_SITE_OTHER): Payer: Medicaid Other | Admitting: Obstetrics and Gynecology

## 2018-08-29 DIAGNOSIS — O0990 Supervision of high risk pregnancy, unspecified, unspecified trimester: Secondary | ICD-10-CM | POA: Diagnosis not present

## 2018-08-29 DIAGNOSIS — O0993 Supervision of high risk pregnancy, unspecified, third trimester: Secondary | ICD-10-CM

## 2018-08-29 DIAGNOSIS — Z23 Encounter for immunization: Secondary | ICD-10-CM | POA: Diagnosis not present

## 2018-08-29 DIAGNOSIS — Z3A35 35 weeks gestation of pregnancy: Secondary | ICD-10-CM

## 2018-08-29 DIAGNOSIS — Z9889 Other specified postprocedural states: Secondary | ICD-10-CM

## 2018-08-29 MED ORDER — BETAMETHASONE SOD PHOS & ACET 6 (3-3) MG/ML IJ SUSP: 12.0000 mg | Freq: Once | INTRAMUSCULAR | Status: DC

## 2018-08-29 NOTE — Patient Instructions (Signed)

## 2018-08-29 NOTE — Progress Notes (Signed)
History:   Erica Clements is a 28 y.o. [id number] at [redacted]w[redacted]d by LMP being seen today for her first obstetrical visit.  Her obstetrical history is significant for non-compliance and no prenatal care. Patient does intend to breast feed. Pregnancy history fully reviewed. Patient's Celine Ahr is present today and is adopting the baby.  She was seen in MAU recently for preterm contraction and then admitted. She did receive BMZ.   Patient reports no complaints.   HISTORY: OB History  Gravida Para Term Preterm AB Living  4 2 2  0 1 2  SAB TAB Ectopic Multiple Live Births  1 0 0 0 2    # Outcome Date GA Lbr Len/2nd Weight Sex Delivery Anes PTL Lv  4 Current           3 SAB 2018          2 Term 11/25/15 [redacted]w[redacted]d 08:07 / 00:10 9 lb 2.6 oz (4.156 kg) M Vag-Spont EPI  LIV     Name: SONRISA, BERARDI     Apgar1: 9  Apgar5: 9  1 Term 02/01/14 [redacted]w[redacted]d 309:30 / 00:36 9 lb (4.082 kg) M Vag-Spont EPI  LIV     Name: Gardner Candle     Apgar1: 9  Apgar5: 9    Last pap smear was done 2019 and was normal  Past Medical History:  Diagnosis Date  . Medical history non-contributory    Past Surgical History:  Procedure Laterality Date  . FEMUR IM NAIL Left 09/12/2016   Procedure: INTRAMEDULLARY (IM) NAIL FEMORAL;  Surgeon: Nadara Mustard, MD;  Location: [facility] OR;  Service: Orthopedics;  Laterality: Left;   Family History  Problem Relation Age of Onset  . Cancer Paternal Grandmother   . Diabetes Paternal Grandmother    Social History   Tobacco Use  . Smoking status: Never Smoker  . Smokeless tobacco: Never Used  Substance Use Topics  . Alcohol use: Not Currently    Comment: Socially  . Drug use: No   No Known Allergies Current Outpatient Medications on File Prior to Visit  Medication Sig Dispense Refill  . Prenatal Vit-Fe Fumarate-FA (PRENATAL MULTIVITAMIN) TABS tablet Take 1 tablet by mouth daily at 12 noon. 100 tablet 11   No current facility-administered medications on file prior to visit.     Review  of Systems Pertinent items noted in HPI and remainder of comprehensive ROS otherwise negative. Physical Exam:   Vitals:   08/29/18 0841  BP: (!) 95/56  Pulse: (!) 112  Weight: 157 lb (71.2 kg)   Fetal Heart Rate (bpm): 155 Uterus:     Pelvic Exam: Perineum: no hemorrhoids, normal perineum   Vulva: normal external genitalia, no lesions   Vagina:  normal mucosa, normal discharge  System: General: well-developed, well-nourished female in no acute distress   Breasts:  normal appearance, no masses or tenderness bilaterally   Skin: normal coloration and turgor, no rashes   Neurologic: oriented, normal, negative, normal mood   Extremities: normal strength, tone, and muscle mass, ROM of all joints is normal   HEENT PERRLA, extraocular movement intact and sclera clear, anicteric   Mouth/Teeth mucous membranes moist, pharynx normal without lesions and dental hygiene good   Neck supple and no masses   Cardiovascular: regular rate and rhythm   Respiratory:  no respiratory distress, normal breath sounds   Abdomen: soft, non-tender; bowel sounds normal; no masses,  no organomegaly   Assessment:    Pregnancy: [id number] Patient Active Problem List  Diagnosis Date Noted  . History of cryosurgery 08/30/2018  . Supervision of high risk pregnancy, antepartum 08/29/2018  . Preterm contractions 08/21/2018  . Preterm labor in second trimester 06/27/2018  . Limited prenatal care in third trimester 06/27/2018  . Closed displaced transverse fracture of shaft of left femur (HCC) 09/12/2016  . Closed supracondylar fracture of left femur (HCC)      Plan:   1. Supervision of high risk pregnancy, antepartum  - HgB A1c - Prenatal Panel - Inheritest(R) CF/SMA Panel - Hemoglobinopathy evaluation - Glucose Tolerance, 2 Hours w/1 Hour - preterm labor precautions reviewed in detail.  - BMZ given X 2  2. History of cryosurgery    Initial labs drawn. Continue prenatal vitamins. Genetic Screening  discussed: hemoglobinopathy, CF and SMA collected  Ultrasound discussed; fetal anatomic survey: results reviewed. Problem list reviewed and updated. The nature of [place] [facility] with multiple MDs and other Advanced Practice Providers was explained to patient; also emphasized that residents, students are part of our team. Routine obstetric precautions reviewed. No follow-ups on file.     Samael Blades, Harolyn Rutherford, NP Faculty Practice Center for [employer], [facility]

## 2018-08-30 DIAGNOSIS — Z9889 Other specified postprocedural states: Secondary | ICD-10-CM | POA: Insufficient documentation

## 2018-09-01 ENCOUNTER — Telehealth: Payer: Self-pay | Admitting: Family Medicine

## 2018-09-01 NOTE — Telephone Encounter (Signed)
Attempted to call patient to let her know that her paperwork has been completed and ready for pick-up. No answer, could not lvm due to it not being setup. Will leave a note on next appointment.

## 2018-09-02 ENCOUNTER — Inpatient Hospital Stay (HOSPITAL_COMMUNITY)
Admission: AD | Admit: 2018-09-02 | Discharge: 2018-09-02 | Disposition: A | Discharge status: Home / Self Care | Payer: Medicaid Other | Attending: Obstetrics and Gynecology | Admitting: Obstetrics and Gynecology

## 2018-09-02 ENCOUNTER — Encounter (HOSPITAL_COMMUNITY): Payer: Self-pay

## 2018-09-02 DIAGNOSIS — O4700 False labor before 37 completed weeks of gestation, unspecified trimester: Secondary | ICD-10-CM

## 2018-09-02 DIAGNOSIS — O479 False labor, unspecified: Secondary | ICD-10-CM

## 2018-09-02 DIAGNOSIS — Z3A36 36 weeks gestation of pregnancy: Secondary | ICD-10-CM | POA: Diagnosis not present

## 2018-09-02 DIAGNOSIS — O0990 Supervision of high risk pregnancy, unspecified, unspecified trimester: Secondary | ICD-10-CM

## 2018-09-02 DIAGNOSIS — Z9889 Other specified postprocedural states: Secondary | ICD-10-CM

## 2018-09-02 NOTE — Discharge Instructions (Signed)
Braxton Hicks Contractions Contractions of the uterus can occur throughout pregnancy, but they are not always a sign that you are in labor. You may have practice contractions called Braxton Hicks contractions. These false labor contractions are sometimes confused with true labor. What are Braxton Hicks contractions? Braxton Hicks contractions are tightening movements that occur in the muscles of the uterus before labor. Unlike true labor contractions, these contractions do not result in opening (dilation) and thinning of the cervix. Toward the end of pregnancy (32-34 weeks), Braxton Hicks contractions can happen more often and may become stronger. These contractions are sometimes difficult to tell apart from true labor because they can be very uncomfortable. You should not feel embarrassed if you go to the hospital with false labor. Sometimes, the only way to tell if you are in true labor is for your health care provider to look for changes in the cervix. The health care provider will do a physical exam and may monitor your contractions. If you are not in true labor, the exam should show that your cervix is not dilating and your water has not broken. If there are no other health problems associated with your pregnancy, it is completely safe for you to be sent home with false labor. You may continue to have Braxton Hicks contractions until you go into true labor. How to tell the difference between true labor and false labor True labor  Contractions last 30-70 seconds.  Contractions become very regular.  Discomfort is usually felt in the top of the uterus, and it spreads to the lower abdomen and low back.  Contractions do not go away with walking.  Contractions usually become more intense and increase in frequency.  The cervix dilates and gets thinner. False labor  Contractions are usually shorter and not as strong as true labor contractions.  Contractions are usually irregular.  Contractions  are often felt in the front of the lower abdomen and in the groin.  Contractions may go away when you walk around or change positions while lying down.  Contractions get weaker and are shorter-lasting as time goes on.  The cervix usually does not dilate or become thin. Follow these instructions at home:   Take over-the-counter and prescription medicines only as told by your health care provider.  Keep up with your usual exercises and follow other instructions from your health care provider.  Eat and drink lightly if you think you are going into labor.  If Braxton Hicks contractions are making you uncomfortable: ? Change your position from lying down or resting to walking, or change from walking to resting. ? Sit and rest in a tub of warm water. ? Drink enough fluid to keep your urine pale yellow. Dehydration may cause these contractions. ? Do slow and deep breathing several times an hour.  Keep all follow-up prenatal visits as told by your health care provider. This is important. Contact a health care provider if:  You have a fever.  You have continuous pain in your abdomen. Get help right away if:  Your contractions become stronger, more regular, and closer together.  You have fluid leaking or gushing from your vagina.  You pass blood-tinged mucus (bloody show).  You have bleeding from your vagina.  You have low back pain that you never had before.  You feel your baby's head pushing down and causing pelvic pressure.  Your baby is not moving inside you as much as it used to. Summary  Contractions that occur before labor are   called Braxton Hicks contractions, false labor, or practice contractions.  Braxton Hicks contractions are usually shorter, weaker, farther apart, and less regular than true labor contractions. True labor contractions usually become progressively stronger and regular, and they become more frequent.  Manage discomfort from Braxton Hicks contractions  by changing position, resting in a warm bath, drinking plenty of water, or practicing deep breathing. This information is not intended to replace advice given to you by your health care provider. Make sure you discuss any questions you have with your health care provider. Document Released: 11/04/2016 Document Revised: 04/05/2017 Document Reviewed: 11/04/2016 Elsevier Interactive Patient Education  2019 [employer]  

## 2018-09-02 NOTE — Progress Notes (Signed)
Donette Larry CNM notified of pt's admission and status. Will treat pt as labor eval

## 2018-09-02 NOTE — MAU Note (Addendum)
PT complaining of upper abdominal cramping since 0500. Denies LOF or bleeding. +FM

## 2018-09-02 NOTE — MAU Provider Note (Signed)
S: Erica Clements is a 28 year old who presents for contractions.  Nurse reports no change in vaginal exam since arrival and requests discharge. Strip & Chart Reviewed   O:  Vitals:   09/02/18 0617 09/02/18 0630  BP: (!) 105/58   Pulse: 93   Resp: 16   Temp: 98 F (36.7 C)   TempSrc: Oral   SpO2: 100% 100%   No results found for this or any previous visit (from the past 24 hour(s)).  Dilation: 3.5 Effacement (%): 70 Station: -3, -2 Presentation: Vertex Exam by:: Diana Ansah-Mensah, rnc    NST:  FHR: 135 bpm, Mod Var, - Decels, + Accels UC: Occasional Graphed   A: IUP at 36 wks Cat I FT Contractions-Preterm   P: Strip reviewed-NST Reactive Continue mgmt as ordered Nurse instructed: -Okay to discharge patient -Provide Labor Precautions  Sabas Sous, MSN, CNM 8:19 AM 09/02/2018

## 2018-09-03 DIAGNOSIS — Z029 Encounter for administrative examinations, unspecified: Secondary | ICD-10-CM

## 2018-09-05 ENCOUNTER — Ambulatory Visit (INDEPENDENT_AMBULATORY_CARE_PROVIDER_SITE_OTHER): Payer: Medicaid Other | Admitting: Obstetrics & Gynecology

## 2018-09-05 ENCOUNTER — Other Ambulatory Visit (HOSPITAL_COMMUNITY)
Admission: RE | Admit: 2018-09-05 | Discharge: 2018-09-05 | Disposition: A | Discharge status: Home / Self Care | Payer: Medicaid Other | Source: Ambulatory Visit | Attending: Obstetrics & Gynecology | Admitting: Obstetrics & Gynecology

## 2018-09-05 VITALS — BP 106/60 | HR 88 | Wt 156.1 lb

## 2018-09-05 DIAGNOSIS — Z3A36 36 weeks gestation of pregnancy: Secondary | ICD-10-CM | POA: Diagnosis not present

## 2018-09-05 DIAGNOSIS — Z3493 Encounter for supervision of normal pregnancy, unspecified, third trimester: Secondary | ICD-10-CM | POA: Insufficient documentation

## 2018-09-05 DIAGNOSIS — O0990 Supervision of high risk pregnancy, unspecified, unspecified trimester: Secondary | ICD-10-CM

## 2018-09-05 DIAGNOSIS — O0993 Supervision of high risk pregnancy, unspecified, third trimester: Secondary | ICD-10-CM | POA: Diagnosis not present

## 2018-09-05 NOTE — Progress Notes (Signed)
   PRENATAL VISIT NOTE  Subjective:  Erica Clements is a 28 y.o. A0T6226 at [redacted]w[redacted]d being seen today for ongoing prenatal care.  She is currently monitored for the following issues for this high-risk pregnancy and has Closed displaced transverse fracture of shaft of left femur (HCC); Closed supracondylar fracture of left femur (HCC); Preterm labor in second trimester; Limited prenatal care in third trimester; Preterm contractions; Supervision of high risk pregnancy, antepartum; History of cryosurgery; and Pregnancy with adoption planned, third trimester on their problem list.  Patient reports no complaints.  Contractions: Irregular. Vag. Bleeding: None.  Movement: Present. Denies leaking of fluid.   The following portions of the patient's history were reviewed and updated as appropriate: allergies, current medications, past family history, past medical history, past social history, past surgical history and problem list. Problem list updated.  Objective:   Vitals:   09/05/18 1112  BP: 106/60  Pulse: 88  Weight: 156 lb 1.6 oz (70.8 kg)    Fetal Status:     Movement: Present     General:  Alert, oriented and cooperative. Patient is in no acute distress.  Skin: Skin is warm and dry. No rash noted.   Cardiovascular: Normal heart rate noted  Respiratory: Normal respiratory effort, no problems with respiration noted  Abdomen: Soft, gravid, appropriate for gestational age.  Pain/Pressure: Present     Pelvic: Cervical exam performed        Extremities: Normal range of motion.  Edema: None  Mental Status: Normal mood and affect. Normal behavior. Normal judgment and thought content.   Assessment and Plan:  Pregnancy: [id number] at [redacted]w[redacted]d  1. Supervision of high risk pregnancy, antepartum  - Culture, beta strep (group b only) - Cervicovaginal ancillary only( [place])  Preterm labor symptoms and general obstetric precautions including but not limited to vaginal bleeding, contractions, leaking  of fluid and fetal movement were reviewed in detail with the patient. Please refer to After Visit Summary for other counseling recommendations.  No follow-ups on file.  No future appointments.  Allie Bossier, MD

## 2018-09-06 LAB — GLUCOSE TOLERANCE, 2 HOURS W/ 1HR
Glucose, 1 hour: 172 mg/dL (ref 65–179)
Glucose, 2 hour: 147 mg/dL (ref 65–152)
Glucose, Fasting: 81 mg/dL (ref 65–91)

## 2018-09-06 LAB — HEMOGLOBINOPATHY EVALUATION
HEMOGLOBIN F QUANTITATION: 0 % (ref 0.0–2.0)
HGB C: 0 %
HGB S: 0 %
HGB VARIANT: 0 %
Hemoglobin A2 Quantitation: 2.2 % (ref 1.8–3.2)
Hgb A: 97.8 % (ref 96.4–98.8)

## 2018-09-06 LAB — INHERITEST(R) CF/SMA PANEL

## 2018-09-06 LAB — RPR+RH+ABO+RUB AB+AB SCR+CB...
Antibody Screen: NEGATIVE
HEP B S AG: NEGATIVE
HIV SCREEN 4TH GENERATION: NONREACTIVE
Hematocrit: 28.1 % — ABNORMAL LOW (ref 34.0–46.6)
Hemoglobin: 9.8 g/dL — ABNORMAL LOW (ref 11.1–15.9)
MCH: 29 pg (ref 26.6–33.0)
MCHC: 34.9 g/dL (ref 31.5–35.7)
MCV: 83 fL (ref 79–97)
Platelets: 288 10*3/uL (ref 150–450)
RBC: 3.38 x10E6/uL — AB (ref 3.77–5.28)
RDW: 12.9 % (ref 11.7–15.4)
RPR Ser Ql: NONREACTIVE
Rh Factor: POSITIVE
Rubella Antibodies, IGG: 4.67 index (ref >0.99)
Varicella zoster IgG: 3087 index (ref >165)
WBC: 6.2 10*3/uL (ref 3.4–10.8)

## 2018-09-06 LAB — HEMOGLOBIN A1C
Est. average glucose Bld gHb Est-mCnc: 100 mg/dL
Hgb A1c MFr Bld: 5.1 % (ref 4.8–5.6)

## 2018-09-07 LAB — GC/CHLAMYDIA PROBE AMP (~~LOC~~) NOT AT ARMC
Chlamydia: NEGATIVE
Neisseria Gonorrhea: NEGATIVE

## 2018-09-09 LAB — CULTURE, BETA STREP (GROUP B ONLY): Strep Gp B Culture: NEGATIVE

## 2018-09-12 ENCOUNTER — Ambulatory Visit (INDEPENDENT_AMBULATORY_CARE_PROVIDER_SITE_OTHER): Payer: Medicaid Other | Admitting: Internal Medicine

## 2018-09-12 VITALS — BP 106/59 | HR 92 | Wt 158.0 lb

## 2018-09-12 DIAGNOSIS — O0990 Supervision of high risk pregnancy, unspecified, unspecified trimester: Secondary | ICD-10-CM

## 2018-09-12 DIAGNOSIS — O0993 Supervision of high risk pregnancy, unspecified, third trimester: Secondary | ICD-10-CM

## 2018-09-12 DIAGNOSIS — Z3A37 37 weeks gestation of pregnancy: Secondary | ICD-10-CM | POA: Diagnosis not present

## 2018-09-12 DIAGNOSIS — O99013 Anemia complicating pregnancy, third trimester: Secondary | ICD-10-CM

## 2018-09-12 DIAGNOSIS — Z3493 Encounter for supervision of normal pregnancy, unspecified, third trimester: Secondary | ICD-10-CM

## 2018-09-12 MED ORDER — FERROUS SULFATE 325 (65 FE) MG PO TABS: 325.0000 mg | ORAL_TABLET | Freq: Two times a day (BID) | ORAL | 3 refills | Status: DC

## 2018-09-12 NOTE — Progress Notes (Signed)
   PRENATAL VISIT NOTE  Subjective:  Erica Clements is a 28 y.o. Q6P6195 at [redacted]w[redacted]d being seen today for ongoing prenatal care.  She is currently monitored for the following issues for this high-risk pregnancy and has Closed displaced transverse fracture of shaft of left femur (HCC); Closed supracondylar fracture of left femur (HCC); Preterm labor in second trimester; Limited prenatal care in third trimester; Preterm contractions; Supervision of high risk pregnancy, antepartum; History of cryosurgery; and Pregnancy with adoption planned, third trimester on their problem list.  Patient reports occasional contractions.  Contractions: Irregular. Vag. Bleeding: None.  Movement: Present. Denies leaking of fluid.   The following portions of the patient's history were reviewed and updated as appropriate: allergies, current medications, past family history, past medical history, past social history, past surgical history and problem list.   Objective:   Vitals:   09/12/18 0920  BP: (!) 106/59  Pulse: 92  Weight: 71.7 kg    Fetal Status: Fetal Heart Rate (bpm): 140   Movement: Present     General:  Alert, oriented and cooperative. Patient is in no acute distress.  Skin: Skin is warm and dry. No rash noted.   Cardiovascular: Normal heart rate noted  Respiratory: Normal respiratory effort, no problems with respiration noted  Abdomen: Soft, gravid, appropriate for gestational age.  Pain/Pressure: Present     Pelvic: Cervical exam deferred        Extremities: Normal range of motion.  Edema: None  Mental Status: Normal mood and affect. Normal behavior. Normal judgment and thought content.   Assessment and Plan:  Pregnancy: [id number] at [redacted]w[redacted]d  1. Supervision of high risk pregnancy, antepartum Continue routine PNC.  Reviewed GBS neg & GC/Chlamydia negative.   2. Anemia during pregnancy in third trimester - ferrous sulfate 325 (65 FE) MG tablet; Take 1 tablet (325 mg total) by mouth 2 (two) times  daily with a meal.  Dispense: 60 tablet; Refill: 3  3. Pregnancy with adoption planned, third trimester Discussed IOL due to social issues. Aunt from [place] is adopting baby. 39 week elective IOL scheduled. Will plan to induce with Pitocin as cervix is already favorable.    Term labor symptoms and general obstetric precautions including but not limited to vaginal bleeding, contractions, leaking of fluid and fetal movement were reviewed in detail with the patient. Please refer to After Visit Summary for other counseling recommendations.   Return in about 1 week (around 09/19/2018) for high risk OB.  Future Appointments  Date Time Provider Department Center  09/20/2018 10:15 AM Anyanwu, Jethro Bastos, MD [facility]  09/23/2018  7:00 AM [facility] SCHED ROOM [facility] None    De Hollingshead, DO

## 2018-09-12 NOTE — Patient Instructions (Signed)

## 2018-09-18 ENCOUNTER — Telehealth (HOSPITAL_COMMUNITY): Payer: Self-pay | Admitting: *Deleted

## 2018-09-18 ENCOUNTER — Encounter (HOSPITAL_COMMUNITY): Payer: Self-pay | Admitting: *Deleted

## 2018-09-18 NOTE — Telephone Encounter (Signed)
Preadmission screen  

## 2018-09-19 ENCOUNTER — Telehealth: Payer: Self-pay | Admitting: Obstetrics & Gynecology

## 2018-09-19 ENCOUNTER — Other Ambulatory Visit: Payer: Self-pay | Admitting: Family Medicine

## 2018-09-19 NOTE — Telephone Encounter (Signed)
Patient called and informed of one visitor.

## 2018-09-19 NOTE — Progress Notes (Signed)
Induction orders placed

## 2018-09-20 ENCOUNTER — Ambulatory Visit (INDEPENDENT_AMBULATORY_CARE_PROVIDER_SITE_OTHER): Payer: Medicaid Other | Admitting: Obstetrics & Gynecology

## 2018-09-20 ENCOUNTER — Other Ambulatory Visit: Payer: Self-pay

## 2018-09-20 VITALS — BP 110/58 | HR 94 | Wt 156.0 lb

## 2018-09-20 DIAGNOSIS — Z3A38 38 weeks gestation of pregnancy: Secondary | ICD-10-CM

## 2018-09-20 DIAGNOSIS — Z3493 Encounter for supervision of normal pregnancy, unspecified, third trimester: Secondary | ICD-10-CM

## 2018-09-20 DIAGNOSIS — O0993 Supervision of high risk pregnancy, unspecified, third trimester: Secondary | ICD-10-CM | POA: Diagnosis not present

## 2018-09-20 DIAGNOSIS — O0990 Supervision of high risk pregnancy, unspecified, unspecified trimester: Secondary | ICD-10-CM

## 2018-09-20 NOTE — Progress Notes (Addendum)
   PRENATAL VISIT NOTE  Subjective:  Erica Clements is a 28 y.o. [id number] at [redacted]w[redacted]d being seen today for ongoing prenatal care.  She is currently monitored for the following issues for this high-risk pregnancy and has Closed displaced transverse fracture of shaft of left femur (HCC); Closed supracondylar fracture of left femur (HCC); Limited prenatal care in third trimester; Supervision of high risk pregnancy, antepartum; History of cryosurgery; and Pregnancy with adoption planned, third trimester on their problem list.  Patient reports no complaints.  Contractions: Irregular. Vag. Bleeding: None.  Movement: Present. Denies leaking of fluid.   The following portions of the patient's history were reviewed and updated as appropriate: allergies, current medications, past family history, past medical history, past social history, past surgical history and problem list.   Objective:   Vitals:   09/20/18 1025  BP: (!) 110/58  Pulse: 94  Weight: 156 lb (70.8 kg)    Fetal Status: Fetal Heart Rate (bpm): 146 Fundal Height: 38 cm Movement: Present     General:  Alert, oriented and cooperative. Patient is in no acute distress.  Skin: Skin is warm and dry. No rash noted.   Cardiovascular: Normal heart rate noted  Respiratory: Normal respiratory effort, no problems with respiration noted  Abdomen: Soft, gravid, appropriate for gestational age.  Pain/Pressure: Present     Pelvic: Cervical exam deferred        Extremities: Normal range of motion.  Edema: None  Mental Status: Normal mood and affect. Normal behavior. Normal judgment and thought content.   Assessment and Plan:  Pregnancy: [id number] at [redacted]w[redacted]d 1. Pregnancy with adoption planned, third trimester Papers signed, patient will bring to hospital during delivery. IOL scheduled at 39 weeks previously.   2. Supervision of high risk pregnancy, antepartum Term labor symptoms and general obstetric precautions including but not limited to vaginal  bleeding, contractions, leaking of fluid and fetal movement were reviewed in detail with the patient. Please refer to After Visit Summary for other counseling recommendations.   Return in about 5 weeks (around 10/25/2018) for Postpartum check.  Future Appointments  Date Time Provider Department Center  09/23/2018  7:00 AM [facility] SCHED ROOM [facility] None    Jaynie Collins, MD

## 2018-09-20 NOTE — Patient Instructions (Addendum)
Return to office for any scheduled appointments. Call the office or go to the MAU at [facility] if:  You begin to have strong, frequent contractions  Your water breaks.  Sometimes it is a big gush of fluid, sometimes it is just a trickle that keeps getting your panties wet or running down your legs  You have vaginal bleeding.  It is normal to have a small amount of spotting if your cervix was checked.   You do not feel your baby moving like normal.  If you do not, get something to eat and drink and lay down and focus on feeling your baby move.   If your baby is still not moving like normal, you should call the office or go to MAU.  Any other obstetric concerns.   

## 2018-09-22 ENCOUNTER — Other Ambulatory Visit (HOSPITAL_COMMUNITY): Payer: Self-pay | Admitting: *Deleted

## 2018-09-23 ENCOUNTER — Other Ambulatory Visit: Payer: Self-pay | Admitting: Advanced Practice Midwife

## 2018-09-23 ENCOUNTER — Inpatient Hospital Stay (HOSPITAL_COMMUNITY): Payer: Medicaid Other | Admitting: Anesthesiology

## 2018-09-23 ENCOUNTER — Inpatient Hospital Stay (HOSPITAL_COMMUNITY): Payer: Medicaid Other

## 2018-09-23 ENCOUNTER — Other Ambulatory Visit: Payer: Self-pay

## 2018-09-23 ENCOUNTER — Inpatient Hospital Stay (HOSPITAL_COMMUNITY)
Admission: AD | Admit: 2018-09-23 | Discharge: 2018-09-24 | DRG: 807 | Disposition: A | Discharge status: Home / Self Care | Payer: Medicaid Other | Attending: Obstetrics and Gynecology | Admitting: Obstetrics and Gynecology

## 2018-09-23 ENCOUNTER — Encounter (HOSPITAL_COMMUNITY): Payer: Self-pay | Admitting: *Deleted

## 2018-09-23 DIAGNOSIS — Z3490 Encounter for supervision of normal pregnancy, unspecified, unspecified trimester: Secondary | ICD-10-CM | POA: Diagnosis present

## 2018-09-23 DIAGNOSIS — Z3493 Encounter for supervision of normal pregnancy, unspecified, third trimester: Secondary | ICD-10-CM

## 2018-09-23 DIAGNOSIS — Z3A39 39 weeks gestation of pregnancy: Secondary | ICD-10-CM | POA: Diagnosis not present

## 2018-09-23 DIAGNOSIS — O0990 Supervision of high risk pregnancy, unspecified, unspecified trimester: Secondary | ICD-10-CM

## 2018-09-23 DIAGNOSIS — O26893 Other specified pregnancy related conditions, third trimester: Secondary | ICD-10-CM | POA: Diagnosis present

## 2018-09-23 LAB — CBC
HCT: 29.5 % — ABNORMAL LOW (ref 36.0–46.0)
HEMOGLOBIN: 9.4 g/dL — AB (ref 12.0–15.0)
MCH: 27.6 pg (ref 26.0–34.0)
MCHC: 31.9 g/dL (ref 30.0–36.0)
MCV: 86.5 fL (ref 80.0–100.0)
PLATELETS: 308 10*3/uL (ref 150–400)
RBC: 3.41 MIL/uL — ABNORMAL LOW (ref 3.87–5.11)
RDW: 13.2 % (ref 11.5–15.5)
WBC: 8.9 10*3/uL (ref 4.0–10.5)
nRBC: 0 % (ref 0.0–0.2)

## 2018-09-23 LAB — TYPE AND SCREEN
ABO/RH(D): B POS
Antibody Screen: NEGATIVE

## 2018-09-23 LAB — RPR: RPR Ser Ql: NONREACTIVE

## 2018-09-23 MED ORDER — BENZOCAINE-MENTHOL 20-0.5 % EX AERO: 1.0000 "application " | INHALATION_SPRAY | CUTANEOUS | Status: DC | PRN

## 2018-09-23 MED ORDER — COCONUT OIL OIL: 1.0000 "application " | TOPICAL_OIL | Status: DC | PRN

## 2018-09-23 MED ORDER — SENNOSIDES-DOCUSATE SODIUM 8.6-50 MG PO TABS
2.0000 | ORAL_TABLET | ORAL | Status: DC
  Administered 2018-09-23: 2 via ORAL
  Filled 2018-09-23: qty 2

## 2018-09-23 MED ORDER — ZOLPIDEM TARTRATE 5 MG PO TABS
5.0000 mg | ORAL_TABLET | Freq: Every evening | ORAL | Status: DC | PRN
  Administered 2018-09-23: 5 mg via ORAL
  Filled 2018-09-23: qty 1

## 2018-09-23 MED ORDER — DIBUCAINE 1 % RE OINT: 1.0000 "application " | TOPICAL_OINTMENT | RECTAL | Status: DC | PRN

## 2018-09-23 MED ORDER — DIPHENHYDRAMINE HCL 25 MG PO CAPS: 25.0000 mg | ORAL_CAPSULE | Freq: Four times a day (QID) | ORAL | Status: DC | PRN

## 2018-09-23 MED ORDER — PRENATAL MULTIVITAMIN CH
1.0000 | ORAL_TABLET | Freq: Every day | ORAL | Status: DC
  Administered 2018-09-24: 1 via ORAL
  Filled 2018-09-23: qty 1

## 2018-09-23 MED ORDER — SOD CITRATE-CITRIC ACID 500-334 MG/5ML PO SOLN: 30.0000 mL | ORAL | Status: DC | PRN

## 2018-09-23 MED ORDER — LACTATED RINGERS IV SOLN: 500.0000 mL | INTRAVENOUS | Status: DC | PRN

## 2018-09-23 MED ORDER — OXYTOCIN 40 UNITS IN NORMAL SALINE INFUSION - SIMPLE MED
1.0000 m[IU]/min | INTRAVENOUS | Status: DC
  Administered 2018-09-23: 2 m[IU]/min via INTRAVENOUS
  Filled 2018-09-23: qty 1000

## 2018-09-23 MED ORDER — EPHEDRINE 5 MG/ML INJ: 10.0000 mg | INTRAVENOUS | Status: DC | PRN

## 2018-09-23 MED ORDER — FENTANYL-BUPIVACAINE-NACL 0.5-0.125-0.9 MG/250ML-% EP SOLN
12.0000 mL/h | EPIDURAL | Status: DC | PRN
  Filled 2018-09-23: qty 250

## 2018-09-23 MED ORDER — SIMETHICONE 80 MG PO CHEW: 80.0000 mg | CHEWABLE_TABLET | ORAL | Status: DC | PRN

## 2018-09-23 MED ORDER — IBUPROFEN 600 MG PO TABS
600.0000 mg | ORAL_TABLET | Freq: Four times a day (QID) | ORAL | Status: DC
  Administered 2018-09-23 – 2018-09-24 (×5): 600 mg via ORAL
  Filled 2018-09-23 (×5): qty 1

## 2018-09-23 MED ORDER — DIPHENHYDRAMINE HCL 50 MG/ML IJ SOLN: 12.5000 mg | INTRAMUSCULAR | Status: DC | PRN

## 2018-09-23 MED ORDER — LACTATED RINGERS IV SOLN
500.0000 mL | Freq: Once | INTRAVENOUS | Status: AC
  Administered 2018-09-23: 10:00:00 via INTRAVENOUS

## 2018-09-23 MED ORDER — OXYTOCIN 40 UNITS IN NORMAL SALINE INFUSION - SIMPLE MED: 2.5000 [IU]/h | INTRAVENOUS | Status: DC

## 2018-09-23 MED ORDER — LACTATED RINGERS IV SOLN
INTRAVENOUS | Status: DC
  Administered 2018-09-23 (×3): via INTRAVENOUS

## 2018-09-23 MED ORDER — LIDOCAINE HCL (PF) 1 % IJ SOLN
INTRAMUSCULAR | Status: DC | PRN
  Administered 2018-09-23 (×2): 5 mL via EPIDURAL

## 2018-09-23 MED ORDER — PHENYLEPHRINE 40 MCG/ML (10ML) SYRINGE FOR IV PUSH (FOR BLOOD PRESSURE SUPPORT): 80.0000 ug | PREFILLED_SYRINGE | INTRAVENOUS | Status: DC | PRN

## 2018-09-23 MED ORDER — PHENYLEPHRINE 40 MCG/ML (10ML) SYRINGE FOR IV PUSH (FOR BLOOD PRESSURE SUPPORT)
80.0000 ug | PREFILLED_SYRINGE | INTRAVENOUS | Status: DC | PRN
  Filled 2018-09-23: qty 10

## 2018-09-23 MED ORDER — WITCH HAZEL-GLYCERIN EX PADS: 1.0000 "application " | MEDICATED_PAD | CUTANEOUS | Status: DC | PRN

## 2018-09-23 MED ORDER — LIDOCAINE HCL (PF) 1 % IJ SOLN: 30.0000 mL | INTRAMUSCULAR | Status: DC | PRN

## 2018-09-23 MED ORDER — ONDANSETRON HCL 4 MG PO TABS: 4.0000 mg | ORAL_TABLET | ORAL | Status: DC | PRN

## 2018-09-23 MED ORDER — FENTANYL CITRATE (PF) 100 MCG/2ML IJ SOLN: 100.0000 ug | INTRAMUSCULAR | Status: DC | PRN

## 2018-09-23 MED ORDER — OXYTOCIN BOLUS FROM INFUSION
500.0000 mL | Freq: Once | INTRAVENOUS | Status: AC
  Administered 2018-09-23: 999 mL/h via INTRAVENOUS

## 2018-09-23 MED ORDER — SODIUM CHLORIDE (PF) 0.9 % IJ SOLN
INTRAMUSCULAR | Status: DC | PRN
  Administered 2018-09-23: 12 mL/h via EPIDURAL

## 2018-09-23 MED ORDER — ACETAMINOPHEN 325 MG PO TABS
650.0000 mg | ORAL_TABLET | ORAL | Status: DC | PRN
  Administered 2018-09-23 – 2018-09-24 (×2): 650 mg via ORAL
  Filled 2018-09-23 (×2): qty 2

## 2018-09-23 MED ORDER — TERBUTALINE SULFATE 1 MG/ML IJ SOLN: 0.2500 mg | Freq: Once | INTRAMUSCULAR | Status: DC | PRN

## 2018-09-23 MED ORDER — TETANUS-DIPHTH-ACELL PERTUSSIS 5-2.5-18.5 LF-MCG/0.5 IM SUSP: 0.5000 mL | Freq: Once | INTRAMUSCULAR | Status: DC

## 2018-09-23 MED ORDER — ONDANSETRON HCL 4 MG/2ML IJ SOLN: 4.0000 mg | INTRAMUSCULAR | Status: DC | PRN

## 2018-09-23 MED ORDER — DOCUSATE SODIUM 100 MG PO CAPS
100.0000 mg | ORAL_CAPSULE | Freq: Two times a day (BID) | ORAL | Status: DC
  Administered 2018-09-23 – 2018-09-24 (×2): 100 mg via ORAL
  Filled 2018-09-23 (×2): qty 1

## 2018-09-23 MED ORDER — ONDANSETRON HCL 4 MG/2ML IJ SOLN: 4.0000 mg | Freq: Four times a day (QID) | INTRAMUSCULAR | Status: DC | PRN

## 2018-09-23 MED ORDER — ACETAMINOPHEN 325 MG PO TABS: 650.0000 mg | ORAL_TABLET | ORAL | Status: DC | PRN

## 2018-09-23 NOTE — Anesthesia Preprocedure Evaluation (Addendum)
Anesthesia Evaluation  Patient identified by MRN, date of birth, ID band Patient awake    Reviewed: Allergy & Precautions, NPO status , Patient's Chart, lab work & pertinent test results  History of Anesthesia Complications Negative for: history of anesthetic complications  Airway Mallampati: II  TM Distance: >3 FB Neck ROM: Full    Dental  (+) Dental Advisory Given   Pulmonary neg pulmonary ROS,    breath sounds clear to auscultation       Cardiovascular negative cardio ROS   Rhythm:Regular Rate:Normal     Neuro/Psych negative neurological ROS     GI/Hepatic negative GI ROS, Neg liver ROS,   Endo/Other  negative endocrine ROS  Renal/GU negative Renal ROS     Musculoskeletal   Abdominal   Peds  Hematology plt 308k   Anesthesia Other Findings   Reproductive/Obstetrics (+) Pregnancy                            Anesthesia Physical Anesthesia Plan  ASA: II  Anesthesia Plan: Epidural   Post-op Pain Management:    Induction:   PONV Risk Score and Plan: Treatment may vary due to age or medical condition  Airway Management Planned: Natural Airway  Additional Equipment:   Intra-op Plan:   Post-operative Plan:   Informed Consent: I have reviewed the patients History and Physical, chart, labs and discussed the procedure including the risks, benefits and alternatives for the proposed anesthesia with the patient or authorized representative who has indicated his/her understanding and acceptance.     Dental advisory given  Plan Discussed with:   Anesthesia Plan Comments: (Patient identified. Risks/Benefits/Options discussed with patient including but not limited to bleeding, infection, nerve damage, paralysis, failed block, incomplete pain control, headache, blood pressure changes, nausea, vomiting, reactions to medication both or allergic, itching and postpartum back pain. Confirmed  with bedside nurse the patient's most recent platelet count. Confirmed with patient that they are not currently taking any anticoagulation, have any bleeding history or any family history of bleeding disorders. Patient expressed understanding and wished to proceed. All questions were answered. )       Anesthesia Quick Evaluation

## 2018-09-23 NOTE — H&P (Signed)
LABOR AND DELIVERY ADMISSION HISTORY AND PHYSICAL NOTE  Erica Clements is a 28 y.o. female [phone] with IUP at [redacted]w[redacted]d by 26w US=LMP presenting for elective IOL.  Denies complications in this or prior pregnancy. Baby is up for adoption to aunt, who is at bedside.   She reports positive fetal movement. She denies leakage of fluid or vaginal bleeding.  Prenatal History/Complications: PNC at [facility] Pregnancy complications:  - late to care  Past Medical History: Past Medical History:  Diagnosis Date  . Medical history non-contributory     Past Surgical History: Past Surgical History:  Procedure Laterality Date  . FEMUR IM NAIL Left 09/12/2016   Procedure: INTRAMEDULLARY (IM) NAIL FEMORAL;  Surgeon: Nadara Mustard, MD;  Location: [facility];  Service: Orthopedics;  Laterality: Left;    Obstetrical History: OB History    Gravida  4   Para  2   Term  2   Preterm  0   AB  1   Living  2     SAB  1   TAB  0   Ectopic  0   Multiple      Live Births  2           Social History: Social History   Socioeconomic History  . Marital status: Single    Spouse name: Not on file  . Number of children: Not on file  . Years of education: Not on file  . Highest education level: Not on file  Occupational History  . Not on file  Social Needs  . Financial resource strain: Not on file  . Food insecurity:    Worry: Not on file    Inability: Not on file  . Transportation needs:    Medical: Not on file    Non-medical: Not on file  Tobacco Use  . Smoking status: Never Smoker  . Smokeless tobacco: Never Used  Substance and Sexual Activity  . Alcohol use: Not Currently    Comment: Socially  . Drug use: No  . Sexual activity: Yes  Lifestyle  . Physical activity:    Days per week: Not on file    Minutes per session: Not on file  . Stress: Not on file  Relationships  . Social connections:    Talks on phone: Not on file    Gets together: Not on file    Attends religious service:  Not on file    Active member of club or organization: Not on file    Attends meetings of clubs or organizations: Not on file    Relationship status: Not on file  Other Topics Concern  . Not on file  Social History Narrative   ** Merged History Encounter **        Family History: Family History  Problem Relation Age of Onset  . Cancer Paternal Grandmother   . Diabetes Paternal Grandmother     Allergies: No Known Allergies  Medications Prior to Admission  Medication Sig Dispense Refill Last Dose  . ferrous sulfate 325 (65 FE) MG tablet Take 1 tablet (325 mg total) by mouth 2 (two) times daily with a meal. 60 tablet 3 Taking  . Prenatal Vit-Fe Fumarate-FA (PRENATAL MULTIVITAMIN) TABS tablet Take 1 tablet by mouth daily at 12 noon. 100 tablet 11 Taking     Review of Systems  All systems reviewed and negative except as stated in HPI  Physical Exam Blood pressure (!) 132/51, pulse 98, temperature (!) 97.5 F (36.4 C), temperature source  Oral, resp. rate 18, height 5\' 5"  (1.651 m), weight 71.7 kg, last menstrual period 12/24/2017, SpO2 100 %, unknown if currently breastfeeding. General appearance: alert, oriented, NAD Lungs: normal respiratory effort Heart: regular rate Abdomen: soft, non-tender; gravid, FH appropriate for GA Extremities: No calf swelling or tenderness Presentation: cephalic Fetal monitoring: baseline 115/mod var/+ acels/occasional early decels Uterine activity: regular q2-32m Dilation: 10 Effacement (%): 100 Station: 0 Exam by:: dr Darin Engels  Prenatal labs: ABO, Rh: --/--/B POS (03/21 3817) Antibody: NEG (03/21 0748) Rubella: 4.67 (02/25 0950) RPR: Non Reactive (02/25 0950)  HBsAg: Negative (02/25 0950)  HIV: Non Reactive (02/25 0950)  GC/Chlamydia: negative GBS:   negative 2-hr GTT: 81/172/147 Genetic screening:  negative Anatomy US: normal  Prenatal Transfer Tool  Maternal Diabetes: No Genetic Screening: Normal Maternal Ultrasounds/Referrals:  Normal Fetal Ultrasounds or other Referrals:  None Maternal Substance Abuse:  No Significant Maternal Medications:  None Significant Maternal Lab Results: None  Results for orders placed or performed during the hospital encounter of 09/23/18 (from the past 24 hour(s))  CBC   Collection Time: 09/23/18  7:46 AM  Result Value Ref Range   WBC 8.9 4.0 - 10.5 K/uL   RBC 3.41 (L) 3.87 - 5.11 MIL/uL   Hemoglobin 9.4 (L) 12.0 - 15.0 g/dL   HCT 71.1 (L) 65.7 - 90.3 %   MCV 86.5 80.0 - 100.0 fL   MCH 27.6 26.0 - 34.0 pg   MCHC 31.9 30.0 - 36.0 g/dL   RDW 83.3 38.3 - 29.1 %   Platelets 308 150 - 400 K/uL   nRBC 0.0 0.0 - 0.2 %  Type and screen   Collection Time: 09/23/18  7:48 AM  Result Value Ref Range   ABO/RH(D) B POS    Antibody Screen NEG    Sample Expiration      09/26/2018 Performed at [facility], [address], [place], [place] [address]     Patient Active Problem List   Diagnosis Date Noted  . Encounter for induction of labor 09/23/2018  . Pregnancy with adoption planned, third trimester 09/05/2018  . History of cryosurgery 08/30/2018  . Supervision of high risk pregnancy, antepartum 08/29/2018  . Limited prenatal care in third trimester 06/27/2018  . Closed displaced transverse fracture of shaft of left femur (HCC) 09/12/2016  . Closed supracondylar fracture of left femur (HCC)     Assessment: Erica Clements is a 28 y.o. A0O4599 at [redacted]w[redacted]d here for elective IOL  #Labor: start pitocin. Patient is very favorable on arrival #Pain: Desires epidural #FWB: Cat 1 #ID:  GBS neg #MOF: formula #MOC:Nexplanon #Circ:  Undecided  Erica Vandall,MD 09/23/2018, 11:31 AM

## 2018-09-23 NOTE — Discharge Summary (Signed)
Postpartum Discharge Summary  Patient Name: Erica Clements DOB: [birth year] MRN: [id number]  Date of admission: 09/23/2018 Delivering Provider: Gwenevere Abbot   Date of discharge: 09/24/2018  Admitting diagnosis: pregnancy Intrauterine pregnancy: [redacted]w[redacted]d     Secondary diagnosis:  Principal Problem:   Encounter for induction of labor Active Problems:   Pregnancy with adoption planned, third trimester   Discharge diagnosis: Term Pregnancy Delivered                                                       Postpartum procedures:None Augmentation: Pitocin Complications: None  Hospital course:  Induction of Labor With Vaginal Delivery   28 y.o. yo [phone] at [redacted]w[redacted]d was admitted to the hospital 09/23/2018 for induction of labor.  Indication for induction: Favorable cervix at term.  Patient had an uncomplicated labor course as follows: Membrane Rupture Time/Date: 10:33 AM ,09/23/2018   Intrapartum Procedures: Episiotomy: None [1]                                         Lacerations:  None [1]  Patient had delivery of a Viable infant.  Information for the patient's newborn:  Ortensia, Motzer [[id number]]  Delivery Method: Vag-Spont   09/23/2018  Details of delivery can be found in separate delivery note.  Patient had a routine postpartum course. Patient is discharged home 09/24/18.  Magnesium Sulfate recieved: No BMZ received: No  Physical exam  Vitals:   09/23/18 1527 09/23/18 1657 09/23/18 2037 09/24/18 0554  BP: (!) 94/58 105/66 97/79 (!) 90/57  Pulse: 88 84 92 91  Resp: 20 18 18 18   Temp:  98.1 F (36.7 C) 98.8 F (37.1 C) 98.2 F (36.8 C)  TempSrc:  Oral Oral Oral  SpO2:  100% 99% 100%  Weight:      Height:       General: alert, cooperative and no distress Lochia: appropriate Uterine Fundus: firm Incision: N/A DVT Evaluation: No evidence of DVT seen on physical exam  Labs: Lab Results  Component Value Date   WBC 8.9 09/23/2018   HGB 9.4 (L) 09/23/2018   HCT 29.5 (L)  09/23/2018   MCV 86.5 09/23/2018   PLT 308 09/23/2018   CMP Latest Ref Rng & Units 12/24/2017  Glucose 65 - 99 mg/dL 696(E)  BUN 6 - 20 mg/dL 7  Creatinine 9.52 - 8.41 mg/dL 3.24  Sodium 401 - 027 mmol/L 140  Potassium 3.5 - 5.1 mmol/L 4.1  Chloride 101 - 111 mmol/L 106  CO2 22 - 32 mmol/L 26  Calcium 8.9 - 10.3 mg/dL 9.0  Total Protein 6.5 - 8.1 g/dL 8.1  Total Bilirubin 0.3 - 1.2 mg/dL 0.5  Alkaline Phos 38 - 126 U/L 51  AST 15 - 41 U/L 14(L)  ALT 14 - 54 U/L 13(L)    Discharge instruction: per After Visit Summary and "Baby and Me Booklet".  After visit meds:  Allergies as of 09/24/2018   No Known Allergies     Medication List    TAKE these medications   ferrous sulfate 325 (65 FE) MG tablet Take 1 tablet (325 mg total) by mouth 2 (two) times daily with a meal.   ibuprofen 800 MG tablet Commonly known as:  ADVIL,MOTRIN Take  1 tablet (800 mg total) by mouth 3 (three) times daily.   prenatal multivitamin Tabs tablet Take 1 tablet by mouth daily at 12 noon.   senna-docusate 8.6-50 MG tablet Commonly known as:  Senokot-S Take 2 tablets by mouth at bedtime as needed for mild constipation.       Diet: No evidence of DVT seen on physical exam.  Activity: Advance as tolerated. Pelvic rest for 6 weeks.   Outpatient follow up:6 weeks Follow up Appt:No future appointments. Follow up Visit: Follow-up Information    [facility]. Schedule an appointment as soon as possible for a visit.   Specialty:  Obstetrics and Gynecology Why:  You should receive a call to schedule a postpartum visit. If you do not, please call clinic to make an appointment in 4-6 weeks.  Contact information: [address] [place] [address] [phone]         Please schedule this patient for Postpartum visit in: 6 weeks with the following provider: Any provider For C/S patients schedule nurse incision check in weeks 2 weeks: no High risk pregnancy  complicated by: Late to care, baby up for adoption Delivery mode:  SVD Anticipated Birth Control:  Nexplanon PP Procedures needed: Nexplanon  Schedule Integrated BH visit: yes   Newborn Data: Live born female  Birth Weight: 8 lb 0.9 oz (3655 g) APGAR: 8, 9  Newborn Delivery   Birth date/time:  09/23/2018 10:50:00 Delivery type:  Vaginal, Spontaneous    Baby Feeding: Bottle Disposition:Adoptive parents  09/24/2018 Tamera Stands, DO

## 2018-09-23 NOTE — Progress Notes (Signed)
Okey Regal, Child psychotherapist called and is coming to see pt and family. Left message this am on admission.

## 2018-09-23 NOTE — Anesthesia Procedure Notes (Signed)
Epidural Patient location during procedure: OB Start time: 09/23/2018 9:42 AM End time: 09/23/2018 10:16 AM  Staffing Anesthesiologist: Jairo Ben, MD Performed: anesthesiologist   Preanesthetic Checklist Completed: patient identified, surgical consent, pre-op evaluation, timeout performed, IV checked, risks and benefits discussed and monitors and equipment checked  Epidural Patient position: sitting Prep: site prepped and draped and DuraPrep Patient monitoring: blood pressure, continuous pulse ox and heart rate Approach: midline Location: L3-L4 Injection technique: LOR air  Needle:  Needle type: Tuohy  Needle gauge: 17 G Needle length: 9 cm Needle insertion depth: 4.5 cm Catheter type: closed end flexible Catheter size: 19 Gauge Catheter at skin depth: 10 cm Test dose: negative (1% lidociane)  Assessment Events: blood not aspirated, injection not painful, no injection resistance, negative IV test and no paresthesia  Additional Notes Pt identified in Labor room.  Monitors applied. Working IV access confirmed. Sterile prep, drape lumbar spine.  1% lido local L 3,4.  #17ga Touhy LOR air at 4.5 cm L 3,4, cath in easily to 10 cm skin. Test dose OK, cath dosed and infusion begun.  Patient asymptomatic, VSS, no heme aspirated, pt with very rapid labor and extremely agitated, moving through epidural placement.  Sandford Craze, MD

## 2018-09-23 NOTE — Progress Notes (Signed)
CSW received notification from L&D staff regarding MOB's plan for adoption. CSW met with MOB and adoptive mother Ntombiyoxolo Azzaro at bedside to complete assessment. CSW obtained permission from MOB to speak with adoptive mother present. CSW confirmed with RN and MOB that MOB was not under the influence of any mind altering medications, RN reported that MOB had only received ibuprofen at [facility]. CSW educated MOB and adoptive mother on hospital drug screening policies due to limited prenatal care. Neither MOB or adoptive mother had questions or concerns, MOB denies any substance use during pregnancy.   Adoptive mother reports that she has one child of her own, a 27 year old daughter. The newborn was named by the adoptive mother as Pierre. Adoptive mother reports having all items at home needed for newborn care. Adoptive mother reports that [place] is the designated party to evaluate the safety of the adoptive mother's home. Adoptive mother went to retrieve dinner for MOB and herself.  CSW assisted with the completion of the "Request & Authorization for the Use/Disclosure of Protected Health Information" document for both MOB and newborn. CSW assisted with completion of "Consent to Adoption" document that was completed by Orvan Falconer on behalf of the adoptive mother. CSW had the document notarized by Golda Acre, RN & Hydrographic surveyor at [employer] at [facility].   CSW made copies of all documents and placed them into both charts. CSW placed documents into the paper chart of newborn on the 5th floor and MOB's paper chart on the 2nd floor L&D unit. CSW saw infant in 5th floor nursery being cared for by [place], RN with a temperature check and diaper change. Kenney Houseman, RN stated no concerns for newborn at this time.  CSW will monitor chart for any noted concerns or changes with plan.  Madilyn Fireman, MSW, LCSW-A Clinical Social Worker [facility]  [facility] [employer] [phone]

## 2018-09-24 MED ORDER — IBUPROFEN 800 MG PO TABS: 800.0000 mg | ORAL_TABLET | Freq: Three times a day (TID) | ORAL | 0 refills | Status: DC

## 2018-09-24 MED ORDER — OXYCODONE HCL 5 MG PO TABS
5.0000 mg | ORAL_TABLET | Freq: Once | ORAL | Status: AC
  Administered 2018-09-24: 5 mg via ORAL
  Filled 2018-09-24: qty 1

## 2018-09-24 MED ORDER — SENNOSIDES-DOCUSATE SODIUM 8.6-50 MG PO TABS: 2.0000 | ORAL_TABLET | Freq: Every evening | ORAL | Status: DC | PRN

## 2018-09-24 NOTE — Anesthesia Postprocedure Evaluation (Signed)
Anesthesia Post Note  Patient: Erica Clements  Procedure(s) Performed: AN AD HOC LABOR EPIDURAL     Patient location during evaluation: Mother Baby Anesthesia Type: Epidural Level of consciousness: awake and alert, oriented and patient cooperative Pain management: pain level controlled Vital Signs Assessment: post-procedure vital signs reviewed and stable Respiratory status: spontaneous breathing Cardiovascular status: stable Postop Assessment: no headache, epidural receding, patient able to bend at knees, no signs of nausea or vomiting and able to ambulate Anesthetic complications: no Comments: Pain score 0.    Last Vitals:  Vitals:   09/23/18 2037 09/24/18 0554  BP: 97/79 (!) 90/57  Pulse: 92 91  Resp: 18 18  Temp: 37.1 C 36.8 C  SpO2: 99% 100%    Last Pain:  Vitals:   09/24/18 0554  TempSrc: Oral  PainSc:    Pain Goal:                   [facility]

## 2018-09-25 ENCOUNTER — Telehealth: Payer: Self-pay | Admitting: Family Medicine

## 2018-09-25 ENCOUNTER — Encounter: Payer: Self-pay | Admitting: *Deleted

## 2018-09-25 NOTE — Telephone Encounter (Signed)
Attempted to call patient with her postpartum visit. No answer, and could not leave a message because it was not setup. Letter mailed.

## 2018-10-09 ENCOUNTER — Encounter: Payer: Self-pay | Admitting: Family Medicine

## 2018-10-27 ENCOUNTER — Telehealth: Payer: Self-pay | Admitting: Obstetrics and Gynecology

## 2018-10-27 NOTE — Telephone Encounter (Signed)
The adopt a mom (aunt) called to verify the appointment. Informed of the upcoming appointment date and time.

## 2018-10-30 ENCOUNTER — Telehealth: Payer: Self-pay | Admitting: Advanced Practice Midwife

## 2018-10-30 NOTE — Telephone Encounter (Signed)
Called the patient to inform of the upcoming appointment. Received a message the patient has a voicemail box that has not been setup yet, Good-bye.

## 2018-10-31 ENCOUNTER — Other Ambulatory Visit: Payer: Self-pay

## 2018-10-31 ENCOUNTER — Ambulatory Visit: Payer: Medicaid Other | Admitting: Student

## 2018-10-31 ENCOUNTER — Ambulatory Visit: Payer: Medicaid Other | Admitting: Clinical

## 2018-10-31 NOTE — BH Specialist Note (Signed)
Attempt to call patient to set up WebEx video. No Voicemail or MyChart available; no message left.   Integrated Behavioral Health Initial Visit  MRN: [id number] Name: Erica Clements [facility], LCSW
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
## 2019-11-27 ENCOUNTER — Other Ambulatory Visit: Payer: Self-pay

## 2019-11-27 ENCOUNTER — Encounter (HOSPITAL_BASED_OUTPATIENT_CLINIC_OR_DEPARTMENT_OTHER): Payer: Self-pay

## 2019-11-27 ENCOUNTER — Emergency Department (HOSPITAL_BASED_OUTPATIENT_CLINIC_OR_DEPARTMENT_OTHER)
Admission: EM | Admit: 2019-11-27 | Discharge: 2019-11-27 | Disposition: A | Discharge status: Home / Self Care | Payer: Medicaid Other | Attending: Emergency Medicine | Admitting: Emergency Medicine

## 2019-11-27 DIAGNOSIS — J069 Acute upper respiratory infection, unspecified: Secondary | ICD-10-CM

## 2019-11-27 DIAGNOSIS — N760 Acute vaginitis: Secondary | ICD-10-CM | POA: Insufficient documentation

## 2019-11-27 DIAGNOSIS — R0981 Nasal congestion: Secondary | ICD-10-CM

## 2019-11-27 DIAGNOSIS — F17210 Nicotine dependence, cigarettes, uncomplicated: Secondary | ICD-10-CM | POA: Insufficient documentation

## 2019-11-27 DIAGNOSIS — N899 Noninflammatory disorder of vagina, unspecified: Secondary | ICD-10-CM | POA: Diagnosis present

## 2019-11-27 DIAGNOSIS — Z79899 Other long term (current) drug therapy: Secondary | ICD-10-CM | POA: Insufficient documentation

## 2019-11-27 DIAGNOSIS — B9789 Other viral agents as the cause of diseases classified elsewhere: Secondary | ICD-10-CM | POA: Diagnosis not present

## 2019-11-27 DIAGNOSIS — Z20822 Contact with and (suspected) exposure to covid-19: Secondary | ICD-10-CM | POA: Diagnosis not present

## 2019-11-27 LAB — URINALYSIS, MICROSCOPIC (REFLEX)

## 2019-11-27 LAB — URINALYSIS, ROUTINE W REFLEX MICROSCOPIC
Bilirubin Urine: NEGATIVE
Glucose, UA: NEGATIVE mg/dL
Ketones, ur: 15 mg/dL — AB
Leukocytes,Ua: NEGATIVE
Nitrite: NEGATIVE
Protein, ur: NEGATIVE mg/dL
Specific Gravity, Urine: >1.03 — ABNORMAL HIGH (ref 1.005–1.030)
pH: 6 (ref 5.0–8.0)

## 2019-11-27 LAB — WET PREP, GENITAL
Sperm: NONE SEEN
Trich, Wet Prep: NONE SEEN
Yeast Wet Prep HPF POC: NONE SEEN

## 2019-11-27 LAB — PREGNANCY, URINE: Preg Test, Ur: NEGATIVE

## 2019-11-27 LAB — GROUP A STREP BY PCR: Group A Strep by PCR: NOT DETECTED

## 2019-11-27 MED ORDER — METRONIDAZOLE 500 MG PO TABS: 500.0000 mg | ORAL_TABLET | Freq: Two times a day (BID) | ORAL | 0 refills | Status: AC

## 2019-11-27 NOTE — ED Triage Notes (Signed)
Pt also states "I think I a may have a bacterial infection too" reports vaginal discharge and odor.

## 2019-11-27 NOTE — ED Provider Notes (Signed)
[facility] EMERGENCY DEPARTMENT Provider Note   CSN: [id number] Arrival date & time: 11/27/19  1626     History Chief Complaint  Patient presents with  . Sore Throat  . Vaginal Discharge    Erica Clements is a 29 y.o. female.  The history is provided by the patient.  Vaginal Discharge Quality:  Clear Severity:  Moderate Onset quality:  Gradual Duration:  3 days Timing:  Constant Progression:  Unchanged Chronicity:  Recurrent Relieved by:  Nothing Worsened by:  Nothing Ineffective treatments:  None tried Associated symptoms: no abdominal pain, no dysuria, no fever, no nausea, no rash and no vomiting        Past Medical History:  Diagnosis Date  . Medical history non-contributory     Patient Active Problem List   Diagnosis Date Noted  . Encounter for induction of labor 09/23/2018  . Pregnancy with adoption planned, third trimester 09/05/2018  . History of cryosurgery 08/30/2018  . Supervision of high risk pregnancy, antepartum 08/29/2018  . Limited prenatal care in third trimester 06/27/2018  . Closed displaced transverse fracture of shaft of left femur (HCC) 09/12/2016  . Closed supracondylar fracture of left femur [facility])     Past Surgical History:  Procedure Laterality Date  . FEMUR IM NAIL Left 09/12/2016   Procedure: INTRAMEDULLARY (IM) NAIL FEMORAL;  Surgeon: Nadara Mustard, MD;  Location: [facility] OR;  Service: Orthopedics;  Laterality: Left;     OB History    Gravida  4   Para  3   Term  3   Preterm  0   AB  1   Living  3     SAB  1   TAB  0   Ectopic  0   Multiple  0   Live Births  3           Family History  Problem Relation Age of Onset  . Cancer Paternal Grandmother   . Diabetes Paternal Grandmother     Social History   Tobacco Use  . Smoking status: Current Some Day Smoker    Types: Cigarettes  . Smokeless tobacco: Never Used  Substance Use Topics  . Alcohol use: Yes    Comment: Socially  . Drug use: No    Home Medications Prior to Admission medications   Medication Sig Start Date End Date Taking? Authorizing Provider  ferrous sulfate 325 (65 FE) MG tablet Take 1 tablet (325 mg total) by mouth 2 (two) times daily with a meal. 09/12/18 01/10/19  Arvilla Market, DO  ibuprofen (ADVIL,MOTRIN) 800 MG tablet Take 1 tablet (800 mg total) by mouth 3 (three) times daily. 09/24/18   Tamera Stands, DO  Prenatal Vit-Fe Fumarate-FA (PRENATAL MULTIVITAMIN) TABS tablet Take 1 tablet by mouth daily at 12 noon. 06/29/18   Lazaro Arms, MD  senna-docusate (SENOKOT-S) 8.6-50 MG tablet Take 2 tablets by mouth at bedtime as needed for mild constipation. 09/24/18   Tamera Stands, DO    Allergies    Patient has no known allergies.  Review of Systems   Review of Systems  Constitutional: Positive for chills. Negative for diaphoresis, fatigue and fever.  HENT: Positive for congestion, postnasal drip, rhinorrhea, sneezing and sore throat. Negative for sinus pressure, sinus pain, trouble swallowing and voice change.   Eyes: Negative for visual disturbance.  Respiratory: Negative for cough, chest tightness, shortness of breath, wheezing and stridor.   Cardiovascular: Negative for chest pain.  Gastrointestinal: Negative for abdominal pain, constipation,  diarrhea, nausea and vomiting.  Genitourinary: Positive for vaginal discharge. Negative for dysuria, flank pain, frequency, pelvic pain, vaginal bleeding and vaginal pain.  Musculoskeletal: Negative for back pain.  Skin: Negative for rash and wound.  Neurological: Negative for light-headedness and headaches.  Psychiatric/Behavioral: Negative for agitation and confusion.  All other systems reviewed and are negative.   Physical Exam Updated Vital Signs BP 117/61 (BP Location: Left Arm)   Pulse 83   Temp 99.6 F (37.6 C) (Oral)   Resp 18   Ht 5\' 5"  (1.651 m)   Wt 63.5 kg   LMP 11/20/2019   SpO2 100%   BMI 23.30 kg/m   Physical Exam Vitals and  nursing note reviewed. Exam conducted with a chaperone present.  Constitutional:      General: She is not in acute distress.    Appearance: She is well-developed. She is not ill-appearing, toxic-appearing or diaphoretic.  HENT:     Head: Normocephalic and atraumatic.     Right Ear: External ear normal.     Left Ear: External ear normal.     Nose: Nose normal.     Mouth/Throat:     Mouth: Mucous membranes are moist.     Pharynx: Oropharynx is clear. No pharyngeal swelling, oropharyngeal exudate, posterior oropharyngeal erythema or uvula swelling.  Eyes:     Conjunctiva/sclera: Conjunctivae normal.     Pupils: Pupils are equal, round, and reactive to light.  Cardiovascular:     Rate and Rhythm: Normal rate.     Heart sounds: Normal heart sounds. No murmur.  Pulmonary:     Effort: No respiratory distress.     Breath sounds: No stridor.  Abdominal:     General: There is no distension.     Tenderness: There is no abdominal tenderness. There is no rebound.  Genitourinary:    Vagina: Vaginal discharge present. No erythema, tenderness or bleeding.     Cervix: No cervical motion tenderness, lesion or erythema.     Adnexa:        Right: No tenderness.         Left: No tenderness.    Musculoskeletal:     Cervical back: Normal range of motion and neck supple.  Skin:    General: Skin is warm.     Findings: No erythema or rash.  Neurological:     Mental Status: She is alert and oriented to person, place, and time.     Motor: No abnormal muscle tone.     Coordination: Coordination normal.     Deep Tendon Reflexes: Reflexes are normal and symmetric.     ED Results / Procedures / Treatments   Labs (all labs ordered are listed, but only abnormal results are displayed) Labs Reviewed  WET PREP, GENITAL - Abnormal; Notable for the following components:      Result Value   Clue Cells Wet Prep HPF POC PRESENT (*)    WBC, Wet Prep HPF POC FEW (*)    All other components within normal  limits  URINALYSIS, ROUTINE W REFLEX MICROSCOPIC - Abnormal; Notable for the following components:   Color, Urine AMBER (*)    APPearance CLOUDY (*)    Specific Gravity, Urine >1.030 (*)    Hgb urine dipstick SMALL (*)    Ketones, ur 15 (*)    All other components within normal limits  URINALYSIS, MICROSCOPIC (REFLEX) - Abnormal; Notable for the following components:   Bacteria, UA MANY (*)    All other components within normal  limits  GROUP A STREP BY PCR  SARS CORONAVIRUS 2 (TAT 6-24 HRS)  PREGNANCY, URINE  GC/CHLAMYDIA PROBE AMP ([place]) NOT AT [facility]    EKG None  Radiology No results found.  Procedures Procedures (including critical care time)  Medications Ordered in ED Medications - No data to display  ED Course  I have reviewed the triage vital signs and the nursing notes.  Pertinent labs & imaging results that were available during my care of the patient were reviewed by me and considered in my medical decision making (see chart for details).    MDM Rules/Calculators/A&P                      Erica Clements is a 29 y.o. female with a past medical history significant for prior femur injury who presents with URI symptoms of rhinorrhea, congestion, sore throat, and being sent from her job for [employer] [facility] evaluation as well as several days of vaginal discharge.  Patient reports that she recently completed her menstrual cycle several days ago and is having some vaginal discharge.  She would like to be assessed for infection.  She denies any pelvic or abdominal pain at this time.  She denies any chest pain or shortness of breath.  She denies cough.  She reports has had runny nose, congestion, and sore throat for the last few days in her job center for evaluation to rule out COVID-19 infection.  She has not been vaccinated and denies any Covid contacts to her knowledge.  She otherwise is feeling mild general malaise.  No headache or neck pain or neck stiffness.  On exam, no stridor.   Throat was unremarkable on arrival inspection.  Lungs clear and chest nontender.  Abdomen nontender.  Patient resting comfortably.  We will get a pelvic exam performed with a chaperone and will get labs sent.  We will also check for COVID-19 and strep.  Anticipate reassessment after work-up.    9:30 PM Strep test is negative.  Pregnancy is negative.  Urinalysis does not show infection.  Culture sent.  Wet prep shows clue cells indicative of BV.  Patient given antibiotics for this.  Covid test is still in process and she will be sent home while awaiting those results.  She will stay out of work until this test returns and rest and stay hydrated.  Patient agrees with plan of care and was discharged in good condition.   Final Clinical Impression(s) / ED Diagnoses Final diagnoses:  BV (bacterial vaginosis)  Upper respiratory tract infection, unspecified type  Nasal congestion    Rx / DC Orders ED Discharge Orders         Ordered    metroNIDAZOLE (FLAGYL) 500 MG tablet  2 times daily     11/27/19 2132          Clinical Impression: 1. BV (bacterial vaginosis)   2. Upper respiratory tract infection, unspecified type   3. Nasal congestion     Disposition: Discharge  Condition: Good  I have discussed the results, Dx and Tx plan with the pt(& family if present). He/she/they expressed understanding and agree(s) with the plan. Discharge instructions discussed at great length. Strict return precautions discussed and pt &/or family have verbalized understanding of the instructions. No further questions at time of discharge.    Discharge Medication List as of 11/27/2019  9:32 PM    START taking these medications   Details  metroNIDAZOLE (FLAGYL) 500 MG tablet Take 1 tablet (  500 mg total) by mouth 2 (two) times daily for 7 days., Starting Tue 11/27/2019, Until Tue 12/04/2019, Print        Follow Up: Conan Bowens, MD [address] [place] [address] [phone]      [facility] [address] [id number] DQ QIWL [place] [address] [phone]       Tegeler, Canary Brim, MD 11/28/19 Emeline Darling

## 2019-11-27 NOTE — Discharge Instructions (Signed)
Your work-up today was consistent with bacterial vaginosis likely causing your vaginal discharge.  Your test was negative for strep throat however your coronavirus test is still in process.  Please follow-up on these results in MyChart and if it is positive they will call you.  Please rest and stay hydrated and follow-up with your primary doctor.  If any symptoms change or worsen, please return to the nearest emergency department.

## 2019-11-27 NOTE — ED Triage Notes (Signed)
Pt arrives with c/o sore throat since yesterday with runny nose and congestion. Pt job sent her to get a Covid test.

## 2019-11-28 LAB — SARS CORONAVIRUS 2 (TAT 6-24 HRS): SARS Coronavirus 2: NEGATIVE

## 2019-11-28 LAB — GC/CHLAMYDIA PROBE AMP (~~LOC~~) NOT AT ARMC
Chlamydia: NEGATIVE
Comment: NEGATIVE
Comment: NORMAL
Neisseria Gonorrhea: NEGATIVE

## 2019-12-14 DIAGNOSIS — Z20822 Contact with and (suspected) exposure to covid-19: Secondary | ICD-10-CM | POA: Diagnosis not present

## 2019-12-14 DIAGNOSIS — R031 Nonspecific low blood-pressure reading: Secondary | ICD-10-CM | POA: Diagnosis not present

## 2020-04-07 ENCOUNTER — Emergency Department (HOSPITAL_BASED_OUTPATIENT_CLINIC_OR_DEPARTMENT_OTHER)
Admission: EM | Admit: 2020-04-07 | Discharge: 2020-04-07 | Disposition: A | Discharge status: Home / Self Care | Payer: Medicaid Other

## 2020-04-07 ENCOUNTER — Other Ambulatory Visit: Payer: Self-pay

## 2020-04-07 NOTE — ED Triage Notes (Signed)
Loss of taste and smell,  sore throat, runny nose today.

## 2020-04-08 ENCOUNTER — Ambulatory Visit
Admission: EM | Admit: 2020-04-08 | Discharge: 2020-04-08 | Disposition: A | Discharge status: Home / Self Care | Payer: Medicaid Other | Attending: Emergency Medicine | Admitting: Emergency Medicine

## 2020-04-08 DIAGNOSIS — Z20822 Contact with and (suspected) exposure to covid-19: Secondary | ICD-10-CM | POA: Diagnosis not present

## 2020-04-08 NOTE — Discharge Instructions (Signed)
Your COVID-19 results should be available in 3 to 4 days. Please stay quarantined as much as possible until you obtain those results. Tylenol and ibuprofen alternating can be taken for fever.

## 2020-04-08 NOTE — ED Provider Notes (Signed)
.  Emergency Department Provider Note  ____________________________________________  Time seen: Approximately 11:02 AM  I have reviewed the triage vital signs and the nursing notes.   HISTORY  Chief Complaint loss of taste and smell   Historian Patient     HPI Erica Clements is a 29 y.o. female presents to the emergency department with loss of taste and smell that started yesterday.  Patient reports that her sense of taste and smell has returned today.  She denies nonproductive cough, headache, pharyngitis, emesis or diarrhea.  Her son has developed a nonproductive cough last night and she is requesting COVID-19 testing.  No chest pain, chest tightness or abdominal pain.  No other alleviating measures have been attempted.   Past Medical History:  Diagnosis Date  . Medical history non-contributory      Immunizations up to date:  Yes.     Past Medical History:  Diagnosis Date  . Medical history non-contributory     Patient Active Problem List   Diagnosis Date Noted  . Encounter for induction of labor 09/23/2018  . Pregnancy with adoption planned, third trimester 09/05/2018  . History of cryosurgery 08/30/2018  . Supervision of high risk pregnancy, antepartum 08/29/2018  . Limited prenatal care in third trimester 06/27/2018  . Closed displaced transverse fracture of shaft of left femur (HCC) 09/12/2016  . Closed supracondylar fracture of left femur [facility])     Past Surgical History:  Procedure Laterality Date  . FEMUR IM NAIL Left 09/12/2016   Procedure: INTRAMEDULLARY (IM) NAIL FEMORAL;  Surgeon: Nadara Mustard, MD;  Location: [facility] OR;  Service: Orthopedics;  Laterality: Left;    Prior to Admission medications   Medication Sig Start Date End Date Taking? Authorizing Provider  ferrous sulfate 325 (65 FE) MG tablet Take 1 tablet (325 mg total) by mouth 2 (two) times daily with a meal. 09/12/18 01/10/19  Arvilla Market, DO  ibuprofen (ADVIL,MOTRIN) 800 MG tablet  Take 1 tablet (800 mg total) by mouth 3 (three) times daily. 09/24/18   Tamera Stands, DO  Prenatal Vit-Fe Fumarate-FA (PRENATAL MULTIVITAMIN) TABS tablet Take 1 tablet by mouth daily at 12 noon. 06/29/18   Lazaro Arms, MD  senna-docusate (SENOKOT-S) 8.6-50 MG tablet Take 2 tablets by mouth at bedtime as needed for mild constipation. 09/24/18   Tamera Stands, DO    Allergies Patient has no known allergies.  Family History  Problem Relation Age of Onset  . Cancer Paternal Grandmother   . Diabetes Paternal Grandmother     Social History Social History   Tobacco Use  . Smoking status: Current Some Day Smoker    Types: Cigarettes  . Smokeless tobacco: Never Used  Vaping Use  . Vaping Use: Never used  Substance Use Topics  . Alcohol use: Yes    Comment: Socially  . Drug use: No     Review of Systems  Constitutional: No fever/chills Eyes:  No discharge ENT: No upper respiratory complaints. Respiratory: no cough. No SOB/ use of accessory muscles to breath Gastrointestinal:   No nausea, no vomiting.  No diarrhea.  No constipation. Musculoskeletal: Negative for musculoskeletal pain. Skin: Negative for rash, abrasions, lacerations, ecchymosis.    ____________________________________________   PHYSICAL EXAM:  VITAL SIGNS: ED Triage Vitals  Enc Vitals Group     BP 04/08/20 1025 108/74     Pulse Rate 04/08/20 1025 80     Resp 04/08/20 1025 16     Temp 04/08/20 1025 98.3 F (36.8 C)  Temp Source 04/08/20 1025 Oral     SpO2 04/08/20 1025 97 %     Weight --      Height --      Head Circumference --      Peak Flow --      Pain Score 04/08/20 1030 0     Pain Loc --      Pain Edu? --      Excl. in GC? --      Constitutional: Alert and oriented. Well appearing and in no acute distress. Eyes: Conjunctivae are normal. PERRL. EOMI. Head: Atraumatic. ENT:      Nose: No congestion/rhinnorhea.      Mouth/Throat: Mucous membranes are moist.  Neck: No stridor.   No cervical spine tenderness to palpation. Hematological/Lymphatic/Immunilogical: No cervical lymphadenopathy. Cardiovascular: Normal rate, regular rhythm. Normal S1 and S2.  Good peripheral circulation. Respiratory: Normal respiratory effort without tachypnea or retractions. Lungs CTAB. Good air entry to the bases with no decreased or absent breath sounds Gastrointestinal: Bowel sounds x 4 quadrants. Soft and nontender to palpation. No guarding or rigidity. No distention. Musculoskeletal: Full range of motion to all extremities. No obvious deformities noted Neurologic:  Normal for age. No gross focal neurologic deficits are appreciated.  Skin:  Skin is warm, dry and intact. No rash noted. Psychiatric: Mood and affect are normal for age. Speech and behavior are normal.   ____________________________________________   LABS (all labs ordered are listed, but only abnormal results are displayed)  Labs Reviewed  NOVEL CORONAVIRUS, NAA   ____________________________________________  EKG   ____________________________________________  RADIOLOGY  No results found.  ____________________________________________    PROCEDURES  Procedure(s) performed:     Procedures     Medications - No data to display   ____________________________________________   INITIAL IMPRESSION / ASSESSMENT AND PLAN / ED COURSE  Pertinent labs & imaging results that were available during my care of the patient were reviewed by me and considered in my medical decision making (see chart for details).      Assessment and plan Encounter for COVID-30 testing 29 year old female presents to the emergency department with loss of taste and smell that started yesterday.  Vital signs were reassuring at triage.  On physical exam, patient was alert, active and nontoxic.Marland Kitchen  No adventitious lung sounds were auscultated.  Sendoff COVID-19 testing is in process at this time.  Rest and hydration were encouraged  at home.  Tylenol and ibuprofen alternating were recommended if fever occurs.   ____________________________________________  FINAL CLINICAL IMPRESSION(S) / ED DIAGNOSES  Final diagnoses:  Encounter for laboratory testing for COVID-19 virus      NEW MEDICATIONS STARTED DURING THIS VISIT:  ED Discharge Orders    None          This chart was dictated using voice recognition software/Dragon. Despite best efforts to proofread, errors can occur which can change the meaning. Any change was purely unintentional.     Orvil Feil, PA-C 04/08/20 1104

## 2020-04-08 NOTE — ED Triage Notes (Signed)
Pt presents with complaints of loss of taste and smell yesterday that has subsided. Pt denies any other symptoms. Pt concerned for covid.

## 2020-04-09 LAB — NOVEL CORONAVIRUS, NAA: SARS-CoV-2, NAA: NOT DETECTED

## 2020-04-09 LAB — SARS-COV-2, NAA 2 DAY TAT

## 2020-05-21 ENCOUNTER — Other Ambulatory Visit: Payer: Self-pay

## 2020-05-21 ENCOUNTER — Encounter: Payer: Self-pay | Admitting: Emergency Medicine

## 2020-05-21 ENCOUNTER — Ambulatory Visit
Admission: EM | Admit: 2020-05-21 | Discharge: 2020-05-21 | Disposition: A | Discharge status: Home / Self Care | Payer: Medicaid Other | Attending: Family Medicine | Admitting: Family Medicine

## 2020-05-21 DIAGNOSIS — N760 Acute vaginitis: Secondary | ICD-10-CM | POA: Diagnosis not present

## 2020-05-21 DIAGNOSIS — N390 Urinary tract infection, site not specified: Secondary | ICD-10-CM

## 2020-05-21 LAB — POCT URINALYSIS DIP (MANUAL ENTRY)
Bilirubin, UA: NEGATIVE
Glucose, UA: NEGATIVE mg/dL
Ketones, POC UA: NEGATIVE mg/dL
Nitrite, UA: NEGATIVE
Spec Grav, UA: >=1.03 — AB (ref 1.010–1.025)
Urobilinogen, UA: 1 E.U./dL
pH, UA: 6.5 (ref 5.0–8.0)

## 2020-05-21 LAB — POCT URINE PREGNANCY: Preg Test, Ur: NEGATIVE

## 2020-05-21 MED ORDER — METRONIDAZOLE 500 MG PO TABS
500.0000 mg | ORAL_TABLET | Freq: Two times a day (BID) | ORAL | 0 refills | Status: DC
Start: 2020-05-21 — End: 2020-07-11

## 2020-05-21 MED ORDER — NITROFURANTOIN MONOHYD MACRO 100 MG PO CAPS
100.0000 mg | ORAL_CAPSULE | Freq: Two times a day (BID) | ORAL | 0 refills | Status: DC
Start: 2020-05-21 — End: 2020-12-31

## 2020-05-21 NOTE — ED Provider Notes (Signed)
Erica Clements    CSN: [id number] Arrival date & time: 05/21/20  1335      History   Chief Complaint Chief Complaint  Patient presents with  . Dysuria    HPI Erica Clements is a 29 y.o. female.   Patient complains of dysuria since last night.  She has some frequency denies back pain fever chills nausea or vomiting.  Also complains of some white vaginal discharge but denies itching.  Past history of vaginitis as well as  HPI  Past Medical History:  Diagnosis Date  . Medical history non-contributory     Patient Active Problem List   Diagnosis Date Noted  . Encounter for induction of labor 09/23/2018  . Pregnancy with adoption planned, third trimester 09/05/2018  . History of cryosurgery 08/30/2018  . Supervision of high risk pregnancy, antepartum 08/29/2018  . Limited prenatal Clements in third trimester 06/27/2018  . Closed displaced transverse fracture of shaft of left femur (HCC) 09/12/2016  . Closed supracondylar fracture of left femur [facility])     Past Surgical History:  Procedure Laterality Date  . FEMUR IM NAIL Left 09/12/2016   Procedure: INTRAMEDULLARY (IM) NAIL FEMORAL;  Surgeon: Nadara Mustard, MD;  Location: [facility] OR;  Service: Orthopedics;  Laterality: Left;    OB History    Gravida  4   Para  3   Term  3   Preterm  0   AB  1   Living  3     SAB  1   TAB  0   Ectopic  0   Multiple  0   Live Births  3            Home Medications    Prior to Admission medications   Medication Sig Start Date End Date Taking? Authorizing Provider  ferrous sulfate 325 (65 FE) MG tablet Take 1 tablet (325 mg total) by mouth 2 (two) times daily with a meal. 09/12/18 01/10/19  Arvilla Market, DO  ibuprofen (ADVIL,MOTRIN) 800 MG tablet Take 1 tablet (800 mg total) by mouth 3 (three) times daily. 09/24/18   Tamera Stands, DO  metroNIDAZOLE (FLAGYL) 500 MG tablet Take 1 tablet (500 mg total) by mouth 2 (two) times daily. 05/21/20   Frederica Kuster, MD  nitrofurantoin, macrocrystal-monohydrate, (MACROBID) 100 MG capsule Take 1 capsule (100 mg total) by mouth 2 (two) times daily. 05/21/20   Frederica Kuster, MD  Prenatal Vit-Fe Fumarate-FA (PRENATAL MULTIVITAMIN) TABS tablet Take 1 tablet by mouth daily at 12 noon. 06/29/18   Lazaro Arms, MD  senna-docusate (SENOKOT-S) 8.6-50 MG tablet Take 2 tablets by mouth at bedtime as needed for mild constipation. 09/24/18   Tamera Stands, DO    Family History Family History  Problem Relation Age of Onset  . Cancer Paternal Grandmother   . Diabetes Paternal Grandmother     Social History Social History   Tobacco Use  . Smoking status: Current Some Day Smoker    Types: Cigarettes  . Smokeless tobacco: Never Used  Vaping Use  . Vaping Use: Never used  Substance Use Topics  . Alcohol use: Yes    Comment: Socially  . Drug use: No     Allergies   Patient has no known allergies.   Review of Systems Review of Systems  Genitourinary: Positive for dysuria, frequency and vaginal discharge.  All other systems reviewed and are negative.    Physical Exam Triage Vital Signs ED Triage Vitals  Enc Vitals Group     BP 05/21/20 1344 112/71     Pulse Rate 05/21/20 1344 83     Resp 05/21/20 1344 15     Temp 05/21/20 1344 98.3 F (36.8 C)     Temp Source 05/21/20 1344 Oral     SpO2 05/21/20 1344 97 %     Weight --      Height --      Head Circumference --      Peak Flow --      Pain Score 05/21/20 1342 0     Pain Loc --      Pain Edu? --      Excl. in GC? --    No data found.  Updated Vital Signs BP 112/71 (BP Location: Left Arm)   Pulse 83   Temp 98.3 F (36.8 C) (Oral)   Resp 15   LMP 04/21/2020   SpO2 97%   Visual Acuity Right Eye Distance:   Left Eye Distance:   Bilateral Distance:    Right Eye Near:   Left Eye Near:    Bilateral Near:     Physical Exam Vitals and nursing note reviewed.  Constitutional:      Appearance: Normal appearance.    Cardiovascular:     Rate and Rhythm: Normal rate and regular rhythm.  Pulmonary:     Effort: Pulmonary effort is normal.     Breath sounds: Normal breath sounds.  Abdominal:     General: There is no distension.     Tenderness: There is no abdominal tenderness. There is no guarding or rebound.     Comments: No CVA tenderness  Neurological:     Mental Status: She is alert.      UC Treatments / Results  Labs (all labs ordered are listed, but only abnormal results are displayed) Labs Reviewed  POCT URINALYSIS DIP (MANUAL ENTRY) - Abnormal; Notable for the following components:      Result Value   Clarity, UA cloudy (*)    Spec Grav, UA >=1.030 (*)    Blood, UA moderate (*)    Protein Ur, POC trace (*)    Leukocytes, UA Large (3+) (*)    All other components within normal limits  POCT URINE PREGNANCY    EKG   Radiology No results found.  Procedures Procedures (including critical Clements time)  Medications Ordered in UC Medications - No data to display  Initial Impression / Assessment and Plan / UC Course  I have reviewed the triage vital signs and the nursing notes.  Pertinent labs & imaging results that were available during my Clements of the patient were reviewed by me and considered in my medical decision making (see chart for details).     Probable urinary tract infection.  Will do culture and begin Macrobid pending results of culture.lso has malodorous discharge. Suspect BV Final Clinical Impressions(s) / UC Diagnoses   Final diagnoses:  Urinary tract infection without hematuria, site unspecified  Vaginitis and vulvovaginitis   Discharge Instructions   None    ED Prescriptions    Medication Sig Dispense Auth. Provider   nitrofurantoin, macrocrystal-monohydrate, (MACROBID) 100 MG capsule Take 1 capsule (100 mg total) by mouth 2 (two) times daily. 10 capsule Frederica Kuster, MD   metroNIDAZOLE (FLAGYL) 500 MG tablet Take 1 tablet (500 mg total) by mouth 2  (two) times daily. 14 tablet Frederica Kuster, MD     PDMP not reviewed this encounter.   Erica Clements  M, MD 05/21/20 2957

## 2020-05-21 NOTE — ED Triage Notes (Signed)
Patient c/o dysuria since last night.   Patient states " when I pee it hurts, not burning, just hurts".   Patient endorses increased frequency. Patient states " I feel like I have to pee but then I don't".   Patient endorses some vaginal discharge w/ white color.   Patient denies ABD pain or "itching".

## 2020-05-23 LAB — URINE CULTURE: Culture: 50000 — AB

## 2020-07-11 ENCOUNTER — Ambulatory Visit
Admission: EM | Admit: 2020-07-11 | Discharge: 2020-07-11 | Disposition: A | Discharge status: Home / Self Care | Payer: Medicaid Other | Attending: Emergency Medicine | Admitting: Emergency Medicine

## 2020-07-11 ENCOUNTER — Other Ambulatory Visit: Payer: Self-pay

## 2020-07-11 ENCOUNTER — Encounter: Payer: Self-pay | Admitting: Emergency Medicine

## 2020-07-11 DIAGNOSIS — Z3201 Encounter for pregnancy test, result positive: Secondary | ICD-10-CM | POA: Diagnosis not present

## 2020-07-11 DIAGNOSIS — R110 Nausea: Secondary | ICD-10-CM | POA: Diagnosis not present

## 2020-07-11 DIAGNOSIS — R1030 Lower abdominal pain, unspecified: Secondary | ICD-10-CM | POA: Diagnosis not present

## 2020-07-11 LAB — POCT URINE PREGNANCY: Preg Test, Ur: POSITIVE — AB

## 2020-07-11 MED ORDER — PRENATAL MULTIVITAMIN CH: 1.0000 | ORAL_TABLET | Freq: Every day | ORAL | 0 refills | Status: DC

## 2020-07-11 MED ORDER — DOXYLAMINE-PYRIDOXINE 10-10 MG PO TBEC: 2.0000 | DELAYED_RELEASE_TABLET | Freq: Every evening | ORAL | 0 refills | Status: DC | PRN

## 2020-07-11 NOTE — Discharge Instructions (Signed)
Please go to women's clinic for evaluation of abdominal pain Pregnancy test positive Diclegis: Two tablets at bedtime on day 1 and 2; if symptoms persist, take 1 tablet in morning and 2 tablets at bedtime on day 3; if symptoms persist, may increase to 1 tablet in morning, 1 tablet mid-afternoon, and 2 tablets at bedtime on day 4 (maximum: doxylamine 40 mg/pyridoxine 40 mg (4 tablets) per day). OR One-half of the 25 mg Unisom sleep tablet over-the-counter tablet or two chewable 5 mg tablets can be used off-label as an antiemetic. In addition, pyridoxine 25 mg, also available over-the-counter, is taken three or four times per day;This is a reasonable, less expensive substitute for combination tablets. Begin prenatal We will call with results of swab

## 2020-07-11 NOTE — ED Triage Notes (Signed)
Patient c/o "mid lower" ABD pain and nausea that started today.   Patient denies dysuria or burning with urination.   Patient denies diarrhea or constipation.   Patient hasn't used any medications for symptoms.

## 2020-07-12 NOTE — ED Provider Notes (Signed)
[name]    CSN: [id number] Arrival date & time: 07/11/20  1901      History   Chief Complaint Chief Complaint  Patient presents with  . Abdominal Pain  . Nausea    HPI Erica Clements is a 30 y.o. female presenting today for evaluation of lower abdominal pain.  Patient reports that beginning today she began to develop nausea and lower abdominal pain which she describes as cramping.  Feels as if her her symptoms are related to bacterial vaginosis.  Reports history of this.  She denies any discharge itching or irritation, but has noticed a slight odor.  Denies urinary symptoms.  Last menstrual cycle 11/30, is not on birth control.  Denies any changes in eating or drinking.  HPI  Past Medical History:  Diagnosis Date  . Medical history non-contributory     Patient Active Problem List   Diagnosis Date Noted  . Encounter for induction of labor 09/23/2018  . Pregnancy with adoption planned, third trimester 09/05/2018  . History of cryosurgery 08/30/2018  . Supervision of high risk pregnancy, antepartum 08/29/2018  . Limited prenatal care in third trimester 06/27/2018  . Closed displaced transverse fracture of shaft of left femur (HCC) 09/12/2016  . Closed supracondylar fracture of left femur [facility])     Past Surgical History:  Procedure Laterality Date  . FEMUR IM NAIL Left 09/12/2016   Procedure: INTRAMEDULLARY (IM) NAIL FEMORAL;  Surgeon: Nadara Mustard, MD;  Location: [facility] OR;  Service: Orthopedics;  Laterality: Left;    OB History    Gravida  4   Para  3   Term  3   Preterm  0   AB  1   Living  3     SAB  1   IAB  0   Ectopic  0   Multiple  0   Live Births  3            Home Medications    Prior to Admission medications   Medication Sig Start Date End Date Taking? Authorizing Provider  Doxylamine-Pyridoxine 10-10 MG TBEC Take 2 tablets by mouth at bedtime as needed. 07/11/20  Yes Camiyah Friberg C, PA-C  ferrous sulfate 325 (65 FE) MG  tablet Take 1 tablet (325 mg total) by mouth 2 (two) times daily with a meal. 09/12/18 01/10/19  Arvilla Market, DO  nitrofurantoin, macrocrystal-monohydrate, (MACROBID) 100 MG capsule Take 1 capsule (100 mg total) by mouth 2 (two) times daily. 05/21/20   Frederica Kuster, MD  Prenatal Vit-Fe Fumarate-FA (PRENATAL MULTIVITAMIN) TABS tablet Take 1 tablet by mouth daily at 12 noon. 07/11/20   Denyse Fillion C, PA-C  senna-docusate (SENOKOT-S) 8.6-50 MG tablet Take 2 tablets by mouth at bedtime as needed for mild constipation. 09/24/18   Tamera Stands, DO    Family History Family History  Problem Relation Age of Onset  . Cancer Paternal Grandmother   . Diabetes Paternal Grandmother     Social History Social History   Tobacco Use  . Smoking status: Current Some Day Smoker    Types: Cigarettes  . Smokeless tobacco: Never Used  Vaping Use  . Vaping Use: Never used  Substance Use Topics  . Alcohol use: Yes    Comment: Socially  . Drug use: No     Allergies   Patient has no known allergies.   Review of Systems Review of Systems  Constitutional: Negative for fever.  Respiratory: Negative for shortness of breath.  Cardiovascular: Negative for chest pain.  Gastrointestinal: Positive for abdominal pain and nausea. Negative for diarrhea and vomiting.  Genitourinary: Negative for dysuria, flank pain, genital sores, hematuria, menstrual problem, vaginal bleeding, vaginal discharge and vaginal pain.  Musculoskeletal: Negative for back pain.  Skin: Negative for rash.  Neurological: Negative for dizziness, light-headedness and headaches.     Physical Exam Triage Vital Signs ED Triage Vitals  Enc Vitals Group     BP 07/11/20 2005 104/71     Pulse Rate 07/11/20 2005 81     Resp 07/11/20 2005 16     Temp 07/11/20 2005 98.8 F (37.1 C)     Temp Source 07/11/20 2005 Oral     SpO2 07/11/20 2005 99 %     Weight 07/11/20 2003 140 lb (63.5 kg)     Height 07/11/20 2003 5\' 5"   (1.651 m)     Head Circumference --      Peak Flow --      Pain Score 07/11/20 2002 6     Pain Loc --      Pain Edu? --      Excl. in GC? --    No data found.  Updated Vital Signs BP 104/71 (BP Location: Left Arm)   Pulse 81   Temp 98.8 F (37.1 C) (Oral)   Resp 16   Ht 5\' 5"  (1.651 m)   Wt 140 lb (63.5 kg)   LMP 06/03/2020   SpO2 99%   BMI 23.30 kg/m   Visual Acuity Right Eye Distance:   Left Eye Distance:   Bilateral Distance:    Right Eye Near:   Left Eye Near:    Bilateral Near:     Physical Exam Vitals and nursing note reviewed.  Constitutional:      Appearance: She is well-developed and well-nourished.     Comments: No acute distress  HENT:     Head: Normocephalic and atraumatic.     Nose: Nose normal.  Eyes:     Conjunctiva/sclera: Conjunctivae normal.  Cardiovascular:     Rate and Rhythm: Normal rate.  Pulmonary:     Effort: Pulmonary effort is normal. No respiratory distress.  Abdominal:     General: There is no distension.  Musculoskeletal:        General: Normal range of motion.     Cervical back: Neck supple.  Skin:    General: Skin is warm and dry.  Neurological:     Mental Status: She is alert and oriented to person, place, and time.  Psychiatric:        Mood and Affect: Mood and affect normal.      UC Treatments / Results  Labs (all labs ordered are listed, but only abnormal results are displayed) Labs Reviewed  POCT URINE PREGNANCY - Abnormal; Notable for the following components:      Result Value   Preg Test, Ur Positive (*)    All other components within normal limits  CERVICOVAGINAL ANCILLARY ONLY    EKG   Radiology No results found.  Procedures Procedures (including critical care time)  Medications Ordered in UC Medications - No data to display  Initial Impression / Assessment and Plan / UC Course  I have reviewed the triage vital signs and the nursing notes.  Pertinent labs & imaging results that were  available during my care of the patient were reviewed by me and considered in my medical decision making (see chart for details).     Pregnancy test positive,  discussed with patient being evaluated in women's clinic for ultrasound.  Vaginal swab pending, deferring empiric treatment to avoid unassertive medicine pregnancy.  Provided Diclegis for nausea, prenatal vitamin.  Discussed following up with OB/GYN.  Discussed strict return precautions. Patient verbalized understanding and is agreeable with plan.  Final Clinical Impressions(s) / UC Diagnoses   Final diagnoses:  Positive pregnancy test  Nausea without vomiting  Lower abdominal pain     Discharge Instructions     Please go to women's clinic for evaluation of abdominal pain Pregnancy test positive Diclegis: Two tablets at bedtime on day 1 and 2; if symptoms persist, take 1 tablet in morning and 2 tablets at bedtime on day 3; if symptoms persist, may increase to 1 tablet in morning, 1 tablet mid-afternoon, and 2 tablets at bedtime on day 4 (maximum: doxylamine 40 mg/pyridoxine 40 mg (4 tablets) per day). OR One-half of the 25 mg Unisom sleep tablet over-the-counter tablet or two chewable 5 mg tablets can be used off-label as an antiemetic. In addition, pyridoxine 25 mg, also available over-the-counter, is taken three or four times per day;This is a reasonable, less expensive substitute for combination tablets. Begin prenatal We will call with results of swab     ED Prescriptions    Medication Sig Dispense Auth. Provider   Prenatal Vit-Fe Fumarate-FA (PRENATAL MULTIVITAMIN) TABS tablet Take 1 tablet by mouth daily at 12 noon. 100 tablet Kehlani Vancamp C, PA-C   Doxylamine-Pyridoxine 10-10 MG TBEC Take 2 tablets by mouth at bedtime as needed. 60 tablet Zahid Carneiro, [place] C, PA-C     PDMP not reviewed this encounter.   Lew Dawes, [place] 07/12/20 [phone]

## 2020-07-14 LAB — CERVICOVAGINAL ANCILLARY ONLY
Bacterial Vaginitis (gardnerella): POSITIVE — AB
Candida Glabrata: NEGATIVE
Candida Vaginitis: NEGATIVE
Chlamydia: NEGATIVE
Comment: NEGATIVE
Comment: NEGATIVE
Comment: NEGATIVE
Comment: NEGATIVE
Comment: NEGATIVE
Comment: NORMAL
Neisseria Gonorrhea: NEGATIVE
Trichomonas: NEGATIVE

## 2020-07-15 ENCOUNTER — Telehealth (HOSPITAL_COMMUNITY): Payer: Self-pay

## 2020-07-15 MED ORDER — METRONIDAZOLE 0.75 % VA GEL: 1.0000 | Freq: Every day | VAGINAL | 0 refills | Status: AC

## 2020-08-21 ENCOUNTER — Ambulatory Visit: Payer: Medicaid Other | Attending: Family

## 2020-08-21 DIAGNOSIS — Z23 Encounter for immunization: Secondary | ICD-10-CM

## 2020-08-25 ENCOUNTER — Ambulatory Visit: Payer: Medicaid Other

## 2020-09-17 ENCOUNTER — Ambulatory Visit: Payer: Medicaid Other

## 2020-12-12 NOTE — Progress Notes (Signed)
   Covid-19 Vaccination Clinic  Name:  Erica Clements    MRN: [id number] DOB: 1991-05-13  12/12/2020  [name] was observed post Covid-19 immunization for 15 minutes without incident. She was provided with Vaccine Information Sheet and instruction to access the V-Safe system.   [name] was instructed to call 911 with any severe reactions post vaccine: Difficulty breathing  Swelling of face and throat  A fast heartbeat  A bad rash all over body  Dizziness and weakness   Immunizations Administered     Name Date Dose VIS Date Route   Moderna COVID-19 Vaccine 08/21/2020  9:00 AM 0.5 mL 04/23/2020 Intramuscular   Manufacturer: Gala Murdoch   Lot: [id number]   NDC: 27062-376-28

## 2020-12-19 DIAGNOSIS — Z03818 Encounter for observation for suspected exposure to other biological agents ruled out: Secondary | ICD-10-CM | POA: Diagnosis not present

## 2020-12-31 ENCOUNTER — Ambulatory Visit
Admission: EM | Admit: 2020-12-31 | Discharge: 2020-12-31 | Disposition: A | Discharge status: Home / Self Care | Payer: Medicaid Other | Attending: Emergency Medicine | Admitting: Emergency Medicine

## 2020-12-31 DIAGNOSIS — K2900 Acute gastritis without bleeding: Secondary | ICD-10-CM | POA: Insufficient documentation

## 2020-12-31 LAB — POCT URINALYSIS DIP (MANUAL ENTRY)
Bilirubin, UA: NEGATIVE
Glucose, UA: NEGATIVE mg/dL
Nitrite, UA: NEGATIVE
Protein Ur, POC: 100 mg/dL — AB
Spec Grav, UA: 1.015 (ref 1.010–1.025)
Urobilinogen, UA: 0.2 E.U./dL
pH, UA: 8.5 — AB (ref 5.0–8.0)

## 2020-12-31 LAB — POCT URINE PREGNANCY: Preg Test, Ur: NEGATIVE

## 2020-12-31 MED ORDER — SUCRALFATE 1 G PO TABS
1.0000 g | ORAL_TABLET | Freq: Three times a day (TID) | ORAL | 0 refills | Status: DC
Start: 2020-12-31 — End: 2022-09-28

## 2020-12-31 MED ORDER — ONDANSETRON 4 MG PO TBDP
4.0000 mg | ORAL_TABLET | Freq: Three times a day (TID) | ORAL | 0 refills | Status: DC | PRN
Start: 2020-12-31 — End: 2022-09-28

## 2020-12-31 MED ORDER — LIDOCAINE VISCOUS HCL 2 % MT SOLN
15.0000 mL | Freq: Once | OROMUCOSAL | Status: AC
  Administered 2020-12-31: 15 mL via ORAL

## 2020-12-31 MED ORDER — OMEPRAZOLE 20 MG PO CPDR
20.0000 mg | DELAYED_RELEASE_CAPSULE | Freq: Two times a day (BID) | ORAL | 0 refills | Status: DC
Start: 2020-12-31 — End: 2022-03-16

## 2020-12-31 MED ORDER — ALUM & MAG HYDROXIDE-SIMETH 200-200-20 MG/5ML PO SUSP
30.0000 mL | Freq: Once | ORAL | Status: AC
  Administered 2020-12-31: 30 mL via ORAL

## 2020-12-31 MED ORDER — FAMOTIDINE 40 MG PO TABS
40.0000 mg | ORAL_TABLET | Freq: Every day | ORAL | 0 refills | Status: DC
Start: 2020-12-31 — End: 2022-09-28

## 2020-12-31 NOTE — ED Triage Notes (Signed)
Pt c/o upper abdominal pain since yesterday with vomiting x1.

## 2020-12-31 NOTE — ED Provider Notes (Signed)
[name]    CSN: [id number] Arrival date & time: 12/31/20  0835      History   Chief Complaint Chief Complaint  Patient presents with   Abdominal Pain    HPI Erica Clements is a 30 y.o. female presenting today for evaluation of abdominal pain.  Reports abdominal pain and for approximately 1 day.  Symptoms began yesterday and are located in upper abdomen.  Reports associated sharp 10 out of 10 pain.  Associated nausea, denies vomiting.  Denies lower abdominal pain or any urinary or pelvic symptoms.  Currently on menstrual cycle.  Denies change in bowel movements or any blood in stool.  Denies symptoms radiating into the chest.  Denies any recent alcohol or tobacco use.  Does report recently taking a lot of NSAIDs for dental pain and symptoms started after.  Denies history of GI problems or prior abdominal surgeries.  HPI  Past Medical History:  Diagnosis Date   Medical history non-contributory     Patient Active Problem List   Diagnosis Date Noted   Encounter for induction of labor 09/23/2018   Pregnancy with adoption planned, third trimester 09/05/2018   History of cryosurgery 08/30/2018   Supervision of high risk pregnancy, antepartum 08/29/2018   Limited prenatal care in third trimester 06/27/2018   Closed displaced transverse fracture of shaft of left femur (HCC) 09/12/2016   Closed supracondylar fracture of left femur [facility])     Past Surgical History:  Procedure Laterality Date   FEMUR IM NAIL Left 09/12/2016   Procedure: INTRAMEDULLARY (IM) NAIL FEMORAL;  Surgeon: Nadara Mustard, MD;  Location: [facility] OR;  Service: Orthopedics;  Laterality: Left;    OB History     Gravida  4   Para  3   Term  3   Preterm  0   AB  1   Living  3      SAB  1   IAB  0   Ectopic  0   Multiple  0   Live Births  3            Home Medications    Prior to Admission medications   Medication Sig Start Date End Date Taking? Authorizing Provider   famotidine (PEPCID) 40 MG tablet Take 1 tablet (40 mg total) by mouth daily. 12/31/20  Yes Kerrie Timm C, PA-C  omeprazole (PRILOSEC) 20 MG capsule Take 1 capsule (20 mg total) by mouth 2 (two) times daily before a meal. 12/31/20 01/30/21 Yes Taten Merrow C, PA-C  ondansetron (ZOFRAN ODT) 4 MG disintegrating tablet Take 1 tablet (4 mg total) by mouth every 8 (eight) hours as needed for nausea or vomiting. 12/31/20  Yes Lorance Pickeral C, PA-C  sucralfate (CARAFATE) 1 g tablet Take 1 tablet (1 g total) by mouth 4 (four) times daily -  with meals and at bedtime. 12/31/20  Yes Amilya Haver, Junius Creamer, PA-C    Family History Family History  Problem Relation Age of Onset   Cancer Paternal Grandmother    Diabetes Paternal Grandmother     Social History Social History   Tobacco Use   Smoking status: Some Days    Pack years: 0.00    Types: Cigarettes   Smokeless tobacco: Never  Vaping Use   Vaping Use: Never used  Substance Use Topics   Alcohol use: Yes    Comment: Socially   Drug use: No     Allergies   Patient has no known allergies.   Review of  Systems Review of Systems  Constitutional:  Negative for fever.  Respiratory:  Negative for shortness of breath.   Cardiovascular:  Negative for chest pain.  Gastrointestinal:  Positive for abdominal pain and nausea. Negative for diarrhea and vomiting.  Genitourinary:  Negative for dysuria, flank pain, genital sores, hematuria, menstrual problem, vaginal bleeding, vaginal discharge and vaginal pain.  Musculoskeletal:  Negative for back pain.  Skin:  Negative for rash.  Neurological:  Negative for dizziness, light-headedness and headaches.    Physical Exam Triage Vital Signs ED Triage Vitals  Enc Vitals Group     BP      Pulse      Resp      Temp      Temp src      SpO2      Weight      Height      Head Circumference      Peak Flow      Pain Score      Pain Loc      Pain Edu?      Excl. in GC?    No data  found.  Updated Vital Signs BP (!) 113/59 (BP Location: Left Arm)   Pulse 62   Temp 98.4 F (36.9 C) (Oral)   Resp 18   LMP 12/31/2020   SpO2 99%   Breastfeeding No   Visual Acuity Right Eye Distance:   Left Eye Distance:   Bilateral Distance:    Right Eye Near:   Left Eye Near:    Bilateral Near:     Physical Exam Vitals and nursing note reviewed.  Constitutional:      Appearance: She is well-developed.     Comments: No acute distress  HENT:     Head: Normocephalic and atraumatic.     Nose: Nose normal.  Eyes:     Conjunctiva/sclera: Conjunctivae normal.  Cardiovascular:     Rate and Rhythm: Normal rate and regular rhythm.  Pulmonary:     Effort: Pulmonary effort is normal. No respiratory distress.     Comments: Breathing comfortably at rest, CTABL, no wheezing, rales or other adventitious sounds auscultated  Abdominal:     General: There is no distension.     Comments: Soft, nondistended, tender to palpation in epigastrium, nontender to bilateral lower quadrants, negative rebound, negative Rovsing, negative McBurney's, negative Murphy's  Musculoskeletal:        General: Normal range of motion.     Cervical back: Neck supple.  Skin:    General: Skin is warm and dry.  Neurological:     Mental Status: She is alert and oriented to person, place, and time.     UC Treatments / Results  Labs (all labs ordered are listed, but only abnormal results are displayed) Labs Reviewed  POCT URINALYSIS DIP (MANUAL ENTRY) - Abnormal; Notable for the following components:      Result Value   Color, UA red (*)    Clarity, UA cloudy (*)    Ketones, POC UA large (80) (*)    Blood, UA large (*)    pH, UA 8.5 (*)    Protein Ur, POC =100 (*)    Leukocytes, UA Trace (*)    All other components within normal limits  URINE CULTURE  POCT URINE PREGNANCY    EKG   Radiology No results found.  Procedures Procedures (including critical care time)  Medications Ordered in  UC Medications  alum & mag hydroxide-simeth (MAALOX/MYLANTA) 200-200-20 MG/5ML suspension 30 mL (  30 mLs Oral Given 12/31/20 1047)    And  lidocaine (XYLOCAINE) 2 % viscous mouth solution 15 mL (15 mLs Oral Given 12/31/20 1047)    Initial Impression / Assessment and Plan / UC Course  I have reviewed the triage vital signs and the nursing notes.  Pertinent labs & imaging results that were available during my care of the patient were reviewed by me and considered in my medical decision making (see chart for details).     Epigastric abdominal pain-most suspicious of underlying gastritis, possible ulcer from recent NSAID use for dental pain, GI cocktail provided with some improvement in symptoms, but not full resolution.  Recommending treatment for underlying gastritis with omeprazole, Carafate before meals and bedtime, support with Pepcid as needed.  Zofran for nausea, avoid trigger foods and avoid NSAIDs temporarily.  Monitor for gradual resolution.  Discussed strict return precautions. Patient verbalized understanding and is agreeable with plan.  Final Clinical Impressions(s) / UC Diagnoses   Final diagnoses:  Acute gastritis, presence of bleeding unspecified, unspecified gastritis type     Discharge Instructions      Begin omeprazole twice daily x2 weeks Carafate/sucralfate before meals and bedtime Pepcid daily to help with underlying discomfort Zofran dissolved in mouth as needed for nausea Avoid trigger foods-see attached Please return if any symptoms not improving or worsening      ED Prescriptions     Medication Sig Dispense Auth. Provider   omeprazole (PRILOSEC) 20 MG capsule Take 1 capsule (20 mg total) by mouth 2 (two) times daily before a meal. 60 capsule Suhani Stillion C, PA-C   sucralfate (CARAFATE) 1 g tablet Take 1 tablet (1 g total) by mouth 4 (four) times daily -  with meals and at bedtime. 30 tablet Juaquin Ludington C, PA-C   ondansetron (ZOFRAN ODT) 4 MG  disintegrating tablet Take 1 tablet (4 mg total) by mouth every 8 (eight) hours as needed for nausea or vomiting. 20 tablet Wyatt Galvan C, PA-C   famotidine (PEPCID) 40 MG tablet Take 1 tablet (40 mg total) by mouth daily. 20 tablet Jhordan Mckibben, [place] C, PA-C      PDMP not reviewed this encounter.   Lew Dawes, [place] 12/31/20 1114

## 2020-12-31 NOTE — Discharge Instructions (Addendum)
Begin omeprazole twice daily x2 weeks Carafate/sucralfate before meals and bedtime Pepcid daily to help with underlying discomfort Zofran dissolved in mouth as needed for nausea Avoid trigger foods-see attached Please return if any symptoms not improving or worsening

## 2021-01-01 ENCOUNTER — Emergency Department (HOSPITAL_BASED_OUTPATIENT_CLINIC_OR_DEPARTMENT_OTHER)
Admission: EM | Admit: 2021-01-01 | Discharge: 2021-01-01 | Disposition: A | Discharge status: Home / Self Care | Payer: Medicaid Other | Attending: Emergency Medicine | Admitting: Emergency Medicine

## 2021-01-01 ENCOUNTER — Encounter (HOSPITAL_BASED_OUTPATIENT_CLINIC_OR_DEPARTMENT_OTHER): Payer: Self-pay

## 2021-01-01 ENCOUNTER — Other Ambulatory Visit: Payer: Self-pay

## 2021-01-01 ENCOUNTER — Emergency Department (HOSPITAL_BASED_OUTPATIENT_CLINIC_OR_DEPARTMENT_OTHER): Payer: Medicaid Other

## 2021-01-01 DIAGNOSIS — F17210 Nicotine dependence, cigarettes, uncomplicated: Secondary | ICD-10-CM | POA: Diagnosis not present

## 2021-01-01 DIAGNOSIS — R600 Localized edema: Secondary | ICD-10-CM | POA: Diagnosis not present

## 2021-01-01 DIAGNOSIS — R1013 Epigastric pain: Secondary | ICD-10-CM | POA: Diagnosis not present

## 2021-01-01 DIAGNOSIS — K3189 Other diseases of stomach and duodenum: Secondary | ICD-10-CM | POA: Diagnosis not present

## 2021-01-01 DIAGNOSIS — K2900 Acute gastritis without bleeding: Secondary | ICD-10-CM | POA: Diagnosis not present

## 2021-01-01 DIAGNOSIS — Z9889 Other specified postprocedural states: Secondary | ICD-10-CM | POA: Diagnosis not present

## 2021-01-01 DIAGNOSIS — N3289 Other specified disorders of bladder: Secondary | ICD-10-CM | POA: Diagnosis not present

## 2021-01-01 LAB — CBC WITH DIFFERENTIAL/PLATELET
Abs Immature Granulocytes: 0.01 10*3/uL (ref 0.00–0.07)
Basophils Absolute: 0 10*3/uL (ref 0.0–0.1)
Basophils Relative: 1 %
Eosinophils Absolute: 0 10*3/uL (ref 0.0–0.5)
Eosinophils Relative: 1 %
HCT: 38.5 % (ref 36.0–46.0)
Hemoglobin: 12.8 g/dL (ref 12.0–15.0)
Immature Granulocytes: 0 %
Lymphocytes Relative: 25 %
Lymphs Abs: 1.9 10*3/uL (ref 0.7–4.0)
MCH: 28.9 pg (ref 26.0–34.0)
MCHC: 33.2 g/dL (ref 30.0–36.0)
MCV: 86.9 fL (ref 80.0–100.0)
Monocytes Absolute: 0.4 10*3/uL (ref 0.1–1.0)
Monocytes Relative: 5 %
Neutro Abs: 5.3 10*3/uL (ref 1.7–7.7)
Neutrophils Relative %: 68 %
Platelets: 428 10*3/uL — ABNORMAL HIGH (ref 150–400)
RBC: 4.43 MIL/uL (ref 3.87–5.11)
RDW: 14 % (ref 11.5–15.5)
WBC: 7.7 10*3/uL (ref 4.0–10.5)
nRBC: 0 % (ref 0.0–0.2)

## 2021-01-01 LAB — COMPREHENSIVE METABOLIC PANEL
ALT: 15 U/L (ref 0–44)
AST: 15 U/L (ref 15–41)
Albumin: 4 g/dL (ref 3.5–5.0)
Alkaline Phosphatase: 44 U/L (ref 38–126)
Anion gap: 7 (ref 5–15)
BUN: 9 mg/dL (ref 6–20)
CO2: 27 mmol/L (ref 22–32)
Calcium: 9.1 mg/dL (ref 8.9–10.3)
Chloride: 102 mmol/L (ref 98–111)
Creatinine, Ser: 0.73 mg/dL (ref 0.44–1.00)
GFR, Estimated: >60 mL/min (ref >60)
Glucose, Bld: 88 mg/dL (ref 70–99)
Potassium: 3.7 mmol/L (ref 3.5–5.1)
Sodium: 136 mmol/L (ref 135–145)
Total Bilirubin: 0.6 mg/dL (ref 0.3–1.2)
Total Protein: 7.9 g/dL (ref 6.5–8.1)

## 2021-01-01 LAB — URINALYSIS, ROUTINE W REFLEX MICROSCOPIC
Bilirubin Urine: NEGATIVE
Glucose, UA: NEGATIVE mg/dL
Ketones, ur: NEGATIVE mg/dL
Leukocytes,Ua: NEGATIVE
Nitrite: NEGATIVE
Protein, ur: NEGATIVE mg/dL
Specific Gravity, Urine: <1.005 — ABNORMAL LOW (ref 1.005–1.030)
pH: 5 (ref 5.0–8.0)

## 2021-01-01 LAB — URINALYSIS, MICROSCOPIC (REFLEX)

## 2021-01-01 LAB — HCG, SERUM, QUALITATIVE: Preg, Serum: NEGATIVE

## 2021-01-01 LAB — LIPASE, BLOOD: Lipase: 27 U/L (ref 11–51)

## 2021-01-01 MED ORDER — IOHEXOL 300 MG/ML  SOLN
100.0000 mL | Freq: Once | INTRAMUSCULAR | Status: AC | PRN
  Administered 2021-01-01: 100 mL via INTRAVENOUS

## 2021-01-01 MED ORDER — SODIUM CHLORIDE 0.9 % IV BOLUS
1000.0000 mL | Freq: Once | INTRAVENOUS | Status: AC
  Administered 2021-01-01: 1000 mL via INTRAVENOUS

## 2021-01-01 MED ORDER — ONDANSETRON HCL 4 MG/2ML IJ SOLN
4.0000 mg | Freq: Once | INTRAMUSCULAR | Status: AC
  Administered 2021-01-01: 4 mg via INTRAVENOUS
  Filled 2021-01-01: qty 2

## 2021-01-01 MED ORDER — FENTANYL CITRATE (PF) 100 MCG/2ML IJ SOLN
50.0000 ug | Freq: Once | INTRAMUSCULAR | Status: AC
  Administered 2021-01-01: 50 ug via INTRAVENOUS
  Filled 2021-01-01: qty 2

## 2021-01-01 MED ORDER — FENTANYL CITRATE (PF) 100 MCG/2ML IJ SOLN
25.0000 ug | Freq: Once | INTRAMUSCULAR | Status: AC
Start: 2021-01-01 — End: 2021-01-01
  Administered 2021-01-01: 25 ug via INTRAVENOUS
  Filled 2021-01-01: qty 2

## 2021-01-01 MED ORDER — FAMOTIDINE IN NACL 20-0.9 MG/50ML-% IV SOLN
20.0000 mg | Freq: Once | INTRAVENOUS | Status: AC
  Administered 2021-01-01: 20 mg via INTRAVENOUS
  Filled 2021-01-01: qty 50

## 2021-01-01 NOTE — ED Triage Notes (Signed)
Upper abdominal pain with vomiting x 2 days "every time I eat or drink something"  seen at urgent care yesterday, dx'd with gastritis, patient state meds are not helping.

## 2021-01-01 NOTE — Discharge Instructions (Addendum)
  You were evaluated in the Emergency Department and after careful evaluation, we did not find any emergent condition requiring admission or further testing in the hospital.   Your exam/testing today was overall reassuring.  Symptoms seem to be due to gastritis.  I did provide you information about this.  Stay away from NSAIDs such as ibuprofen, alcohol.  Continue to use the Pepcid, omeprazole, Zofran and Carafate prescribed.  I also referred you to a GI doctor, please schedule an appointment with them.  He also did have a little bit of blood in your urine, please follow-up with your PCP to get a repeat urine sample in the next week or so. Please return to the Emergency Department if you experience any worsening of your condition.  Thank you for allowing [place] to be a part of your care. Please speak to your pharmacist about any new medications prescribed today in regards to side effects or interactions with other medications.

## 2021-01-01 NOTE — ED Notes (Signed)
When going over d/c instructions with pt , pt stated she would be driving herself home, instructed pt that she received narcotics and would not be able to drive herself home and she would need to find a ride. Pt said she would make some phone calls. Primary nurse notified.

## 2021-01-01 NOTE — ED Provider Notes (Signed)
[facility] EMERGENCY DEPARTMENT Provider Note   CSN: [id number] Arrival date & time: 01/01/21  0998     History Chief Complaint  Patient presents with   Abdominal Pain    Desare Duddy is a 30 y.o. female with no pertinent past medical history that presents emerged department today for upper abdominal pain.  Per chart review patient was seen in urgent care yesterday, diagnosed with acute gastritis, was given GI cocktail.  Patient states that she has been taking her Pepcid, Carafate and omeprazole that she was prescribed yesterday without any relief.  Patient states that abdominal pain is in her epigastric region, does not radiate anywhere.  States that it comes and goes, lasts a couple seconds and then will be relieved for the next 2 minutes and then started up again.  Patient states that it is sharp and severe, she has been vomiting, currently feels nauseous.  Patient states that pain is worse after she eats.  Has never had an endoscopy.  Denies any chance of pregnancy, patient states that she is currently on her menstrual cycle.  Patient denies any pelvic pain or lower abdominal pain.  Denies any diarrhea, normal bowel movements.  Patient is never been diagnosed with any abdominal disease, no history of ulcer.  Does admit to frequent NSAID use with ibuprofen, denies any abnormal alcohol use or substance use.  Denies any back pain or chest pain.  Denies any shortness of breath.  Denies any fevers, cough or URI symptoms.  HPI     Past Medical History:  Diagnosis Date   Medical history non-contributory     Patient Active Problem List   Diagnosis Date Noted   Encounter for induction of labor 09/23/2018   Pregnancy with adoption planned, third trimester 09/05/2018   History of cryosurgery 08/30/2018   Supervision of high risk pregnancy, antepartum 08/29/2018   Limited prenatal care in third trimester 06/27/2018   Closed displaced transverse fracture of shaft of left femur (HCC)  09/12/2016   Closed supracondylar fracture of left femur [facility])     Past Surgical History:  Procedure Laterality Date   FEMUR IM NAIL Left 09/12/2016   Procedure: INTRAMEDULLARY (IM) NAIL FEMORAL;  Surgeon: Nadara Mustard, MD;  Location: [facility] OR;  Service: Orthopedics;  Laterality: Left;     OB History     Gravida  4   Para  3   Term  3   Preterm  0   AB  1   Living  3      SAB  1   IAB  0   Ectopic  0   Multiple  0   Live Births  3           Family History  Problem Relation Age of Onset   Cancer Paternal Grandmother    Diabetes Paternal Grandmother     Social History   Tobacco Use   Smoking status: Some Days    Pack years: 0.00    Types: Cigarettes   Smokeless tobacco: Never  Vaping Use   Vaping Use: Never used  Substance Use Topics   Alcohol use: Yes    Comment: Socially   Drug use: No    Home Medications Prior to Admission medications   Medication Sig Start Date End Date Taking? Authorizing Provider  famotidine (PEPCID) 40 MG tablet Take 1 tablet (40 mg total) by mouth daily. 12/31/20   Wieters, Hallie C, PA-C  omeprazole (PRILOSEC) 20 MG capsule Take 1 capsule (20  mg total) by mouth 2 (two) times daily before a meal. 12/31/20 01/30/21  Wieters, Hallie C, PA-C  ondansetron (ZOFRAN ODT) 4 MG disintegrating tablet Take 1 tablet (4 mg total) by mouth every 8 (eight) hours as needed for nausea or vomiting. 12/31/20   Wieters, Hallie C, PA-C  sucralfate (CARAFATE) 1 g tablet Take 1 tablet (1 g total) by mouth 4 (four) times daily -  with meals and at bedtime. 12/31/20   Wieters, Hallie C, PA-C    Allergies    Patient has no known allergies.  Review of Systems   Review of Systems  Constitutional:  Negative for chills, diaphoresis, fatigue and fever.  HENT:  Negative for congestion, sore throat and trouble swallowing.   Eyes:  Negative for pain and visual disturbance.  Respiratory:  Negative for cough, shortness of breath and wheezing.    Cardiovascular:  Negative for chest pain, palpitations and leg swelling.  Gastrointestinal:  Positive for abdominal pain, nausea and vomiting. Negative for abdominal distention and diarrhea.  Genitourinary:  Negative for difficulty urinating.  Musculoskeletal:  Negative for back pain, neck pain and neck stiffness.  Skin:  Negative for pallor.  Neurological:  Negative for dizziness, speech difficulty, weakness and headaches.  Psychiatric/Behavioral:  Negative for confusion.    Physical Exam Updated Vital Signs BP 110/69   Pulse 69   Temp 98.6 F (37 C)   Resp 15   Ht 5\' 5"  (1.651 m)   Wt 63.5 kg   LMP 12/31/2020   SpO2 100%   BMI 23.30 kg/m   Physical Exam Constitutional:      General: She is not in acute distress.    Appearance: Normal appearance. She is not ill-appearing, toxic-appearing or diaphoretic.  HENT:     Mouth/Throat:     Mouth: Mucous membranes are moist.     Pharynx: Oropharynx is clear.  Eyes:     General: No scleral icterus.    Extraocular Movements: Extraocular movements intact.     Pupils: Pupils are equal, round, and reactive to light.  Cardiovascular:     Rate and Rhythm: Normal rate and regular rhythm.     Pulses: Normal pulses.     Heart sounds: Normal heart sounds.  Pulmonary:     Effort: Pulmonary effort is normal. No respiratory distress.     Breath sounds: Normal breath sounds. No stridor. No wheezing, rhonchi or rales.  Chest:     Chest wall: No tenderness.  Abdominal:     General: Abdomen is flat. There is no distension.     Palpations: Abdomen is soft.     Tenderness: There is abdominal tenderness in the epigastric area. There is no guarding or rebound.  Musculoskeletal:        General: No swelling or tenderness. Normal range of motion.     Cervical back: Normal range of motion and neck supple. No rigidity.     Right lower leg: No edema.     Left lower leg: No edema.  Skin:    General: Skin is warm and dry.     Capillary Refill:  Capillary refill takes less than 2 seconds.     Coloration: Skin is not pale.  Neurological:     General: No focal deficit present.     Mental Status: She is alert and oriented to person, place, and time.  Psychiatric:        Mood and Affect: Mood normal.        Behavior: Behavior normal.  ED Results / Procedures / Treatments   Labs (all labs ordered are listed, but only abnormal results are displayed) Labs Reviewed  CBC WITH DIFFERENTIAL/PLATELET - Abnormal; Notable for the following components:      Result Value   Platelets 428 (*)    All other components within normal limits  URINALYSIS, ROUTINE W REFLEX MICROSCOPIC - Abnormal; Notable for the following components:   Specific Gravity, Urine <1.005 (*)    Hgb urine dipstick MODERATE (*)    All other components within normal limits  URINALYSIS, MICROSCOPIC (REFLEX) - Abnormal; Notable for the following components:   Bacteria, UA RARE (*)    All other components within normal limits  COMPREHENSIVE METABOLIC PANEL  LIPASE, BLOOD  HCG, SERUM, QUALITATIVE    EKG EKG Interpretation  Date/Time:  Thursday January 01 2021 10:35:13 EDT Ventricular Rate:  73 PR Interval:  162 QRS Duration: 87 QT Interval:  351 QTC Calculation: 387 R Axis:   68 Text Interpretation: Sinus rhythm Normal ECG No old tracing to compare Confirmed by Susy FrizzleSheldon, Charles [phone]([id number]) on 01/01/2021 10:45:06 AM  Radiology CT Abdomen Pelvis W Contrast  Result Date: 01/01/2021 CLINICAL DATA:  Epigastric pain for 2 days EXAM: CT ABDOMEN AND PELVIS WITH CONTRAST TECHNIQUE: Multidetector CT imaging of the abdomen and pelvis was performed using the standard protocol following bolus administration of intravenous contrast. CONTRAST:  100mL OMNIPAQUE IOHEXOL 300 MG/ML  SOLN COMPARISON:  09/12/2016 FINDINGS: Lower chest: No acute abnormality. Hepatobiliary: No focal liver abnormality is seen. No gallstones, gallbladder wall thickening, or biliary dilatation. Pancreas:  Unremarkable. No pancreatic ductal dilatation or surrounding inflammatory changes. Spleen: Normal in size without focal abnormality. Adrenals/Urinary Tract: Adrenal glands are within normal limits. Kidneys show a normal enhancement pattern. No renal or ureteral stones are noted. The ureters are within normal limits. Bladder is partially distended. Stomach/Bowel: Appendix is within normal limits. No obstructive or inflammatory changes of the colon are seen. Small bowel is within normal limits. Stomach demonstrates some thickening and edematous changes in the wall consistent with the given clinical history of gastritis. No discrete ulcer is noted at this time. Vascular/Lymphatic: No significant vascular findings are present. No enlarged abdominal or pelvic lymph nodes. Reproductive: Uterus and bilateral adnexa are unremarkable. Tampon is noted in the vaginal vault. Other: No abdominal wall hernia or abnormality. No abdominopelvic ascites. Musculoskeletal: Postsurgical changes in the left femur are noted. No acute bony abnormality is seen. IMPRESSION: Thickened edematous wall of the stomach consistent with the given clinical history of gastritis. No discrete ulceration is noted at this time. No other focal abnormality is noted. Electronically Signed   By: Alcide CleverMark  Lukens M.D.   On: 01/01/2021 11:30    Procedures Procedures   Medications Ordered in ED Medications  sodium chloride 0.9 % bolus 1,000 mL ( Intravenous Stopped 01/01/21 1216)  ondansetron (ZOFRAN) injection 4 mg (4 mg Intravenous Given 01/01/21 1036)  fentaNYL (SUBLIMAZE) injection 50 mcg (50 mcg Intravenous Given 01/01/21 1036)  famotidine (PEPCID) IVPB 20 mg premix (0 mg Intravenous Stopped 01/01/21 1123)  iohexol (OMNIPAQUE) 300 MG/ML solution 100 mL (100 mLs Intravenous Contrast Given 01/01/21 1056)  sodium chloride 0.9 % bolus 1,000 mL (1,000 mLs Intravenous New Bag/Given 01/01/21 1320)  ondansetron (ZOFRAN) injection 4 mg (4 mg Intravenous Given  01/01/21 1319)  fentaNYL (SUBLIMAZE) injection 25 mcg (25 mcg Intravenous Given 01/01/21 1319)    ED Course  I have reviewed the triage vital signs and the nursing notes.  Pertinent labs & imaging results that  were available during my care of the patient were reviewed by me and considered in my medical decision making (see chart for details).    MDM Rules/Calculators/A&P                          Patient presents emerged department today for epigastric pain, currently no acute distress, normal vitals.  Patient is significantly tender in epigastric region, with some guarding.  Will obtain basic labs, high suspicion for PUD.  Initial interventions include fluids, nausea medication and pain medication.  Work-up today grossly unremarkable.  CT abdomen pelvis does show gastritis.  Pain nausea vomiting has been controlled, patient passed p.o. challenge.  Patient is on correct medication regimen for gastritis including Pepcid, Zofran, Carafate and Protonix.  Patient will continue taking this and see GI.  Patient agreeable with plan.  Symptomatic treatment discussed regarding discontinuation of NSAIDs and alcohol.  Strict return precautions given, patient to be discharged.  Doubt need for further emergent work up at this time. I explained the diagnosis and have given explicit precautions to return to the ER including for any other new or worsening symptoms. The patient understands and accepts the medical plan as it's been dictated and I have answered their questions. Discharge instructions concerning home care and prescriptions have been given. The patient is STABLE and is discharged to home in good condition.  Final Clinical Impression(s) / ED Diagnoses Final diagnoses:  Acute gastritis without hemorrhage, unspecified gastritis type    Rx / DC Orders ED Discharge Orders     None        Farrel Gordon, PA-C 01/01/21 1416    Pollyann Savoy, MD 01/01/21 [id number]

## 2021-01-01 NOTE — ED Notes (Signed)
States she has not had any pain or abdominal cramping since CT scan.

## 2021-01-02 ENCOUNTER — Telehealth: Payer: Self-pay | Admitting: *Deleted

## 2021-01-02 LAB — URINE CULTURE

## 2021-01-02 NOTE — Telephone Encounter (Signed)
Transition Care Management Unsuccessful Follow-up Telephone Call  Date of discharge and from where:  01/01/2021 - [facility]  Attempts:  1st Attempt  Reason for unsuccessful TCM follow-up call:  Left voice message

## 2021-01-03 ENCOUNTER — Ambulatory Visit
Admission: EM | Admit: 2021-01-03 | Discharge: 2021-01-03 | Disposition: A | Discharge status: Home / Self Care | Payer: Medicaid Other | Attending: Family Medicine | Admitting: Family Medicine

## 2021-01-03 ENCOUNTER — Encounter: Payer: Self-pay | Admitting: Emergency Medicine

## 2021-01-03 ENCOUNTER — Other Ambulatory Visit: Payer: Self-pay

## 2021-01-03 DIAGNOSIS — N898 Other specified noninflammatory disorders of vagina: Secondary | ICD-10-CM | POA: Insufficient documentation

## 2021-01-03 HISTORY — DX: Gastric ulcer, unspecified as acute or chronic, without hemorrhage or perforation: K25.9

## 2021-01-03 MED ORDER — METRONIDAZOLE 500 MG PO TABS: 500.0000 mg | ORAL_TABLET | Freq: Two times a day (BID) | ORAL | 0 refills | Status: DC

## 2021-01-03 NOTE — ED Triage Notes (Addendum)
Patient c/o  vaginal discharge x 1 day.   Patient endorses a " fishy" vaginal odor. Patient a "yellow" color vaginal discharge.   Patient denies itching. Patient denies dysuria.    Patient endorses ABD pain due to previous diagnosis of a stomach ulcer.   Patient hasn't used any medications for symptoms.

## 2021-01-03 NOTE — Discharge Instructions (Addendum)
We have sent testing for sexually transmitted infections. We will notify you of any positive results once they are received. If required, we will prescribe any medications you might need.  Please refrain from all sexual activity for at least the next seven days.  

## 2021-01-03 NOTE — ED Provider Notes (Signed)
  [facility] CARE CENTER   [id number] 01/03/21 Arrival Time: 5277  ASSESSMENT & PLAN:  1. Vaginal discharge    Would like empiric tx for BV. Begin: Meds ordered this encounter  Medications   metroNIDAZOLE (FLAGYL) 500 MG tablet    Sig: Take 1 tablet (500 mg total) by mouth 2 (two) times daily.    Dispense:  14 tablet    Refill:  0      Discharge Instructions      We have sent testing for sexually transmitted infections. We will notify you of any positive results once they are received. If required, we will prescribe any medications you might need.  Please refrain from all sexual activity for at least the next seven days.     Without s/s of PID.  Labs Reviewed  CERVICOVAGINAL ANCILLARY ONLY    Reviewed expectations re: course of current medical issues. Questions answered. Outlined signs and symptoms indicating need for more acute intervention. Patient verbalized understanding. After Visit Summary given.   SUBJECTIVE:  Erica Clements is a 30 y.o. female who presents with complaint of vaginal discharge. "Feels like BV". History of. Onset abrupt. First noticed yesterday. Describes discharge as thin and clear to slight yellow; with odor. No specific aggravating or alleviating factors reported. Denies: urinary frequency, dysuria, and gross hematuria. Afebrile. No abdominal or pelvic pain. Normal PO intake wihout n/v. No genital rashes or lesions.   Patient's last menstrual period was 12/29/2020 (approximate).   OBJECTIVE:  Vitals:   01/03/21 0906 01/03/21 0909  BP: 98/62   Pulse: 81   Resp: 17   Temp:  98.5 F (36.9 C)  TempSrc: Oral   SpO2: 96%      General appearance: alert, cooperative, appears stated age and no distress Lungs: unlabored respirations; speaks full sentences without difficulty Back: no CVA tenderness; FROM at waist Abdomen: soft, non-tender GU: deferred Skin: warm and dry Psychological: alert and cooperative; normal mood and  affect.   Labs Reviewed  CERVICOVAGINAL ANCILLARY ONLY    No Known Allergies  Past Medical History:  Diagnosis Date   Medical history non-contributory    Stomach ulcer    Family History  Problem Relation Age of Onset   Cancer Paternal Grandmother    Diabetes Paternal Grandmother    Social History   Socioeconomic History   Marital status: Single    Spouse name: Not on file   Number of children: Not on file   Years of education: Not on file   Highest education level: Not on file  Occupational History   Not on file  Tobacco Use   Smoking status: Some Days    Pack years: 0.00    Types: Cigarettes   Smokeless tobacco: Never  Vaping Use   Vaping Use: Never used  Substance and Sexual Activity   Alcohol use: Yes    Comment: Socially   Drug use: No   Sexual activity: Yes    Birth control/protection: None  Other Topics Concern   Not on file  Social History Narrative   ** Merged History Encounter **       Social Determinants of Health   Financial Resource Strain: Not on file  Food Insecurity: Not on file  Transportation Needs: Not on file  Physical Activity: Not on file  Stress: Not on file  Social Connections: Not on file  Intimate Partner Violence: Not on file           Anton, MD 01/03/21 1020

## 2021-01-06 LAB — CERVICOVAGINAL ANCILLARY ONLY
Bacterial Vaginitis (gardnerella): POSITIVE — AB
Candida Glabrata: NEGATIVE
Candida Vaginitis: NEGATIVE
Chlamydia: NEGATIVE
Comment: NEGATIVE
Comment: NEGATIVE
Comment: NEGATIVE
Comment: NEGATIVE
Comment: NEGATIVE
Comment: NORMAL
Neisseria Gonorrhea: NEGATIVE
Trichomonas: POSITIVE — AB

## 2021-01-06 NOTE — Telephone Encounter (Signed)
Transition Care Management Unsuccessful Follow-up Telephone Call  Date of discharge and from where:  01/01/2021 - [facility]  Attempts:  2nd Attempt  Reason for unsuccessful TCM follow-up call:  Left voice message

## 2021-01-07 NOTE — Telephone Encounter (Signed)
Transition Care Management Unsuccessful Follow-up Telephone Call  Date of discharge and from where:  01/01/2021 - [facility]  Attempts:  3rd Attempt  Reason for unsuccessful TCM follow-up call:  Left voice message

## 2021-05-07 ENCOUNTER — Encounter (HOSPITAL_BASED_OUTPATIENT_CLINIC_OR_DEPARTMENT_OTHER): Payer: Self-pay

## 2021-05-07 ENCOUNTER — Other Ambulatory Visit (HOSPITAL_BASED_OUTPATIENT_CLINIC_OR_DEPARTMENT_OTHER): Payer: Self-pay

## 2021-05-07 ENCOUNTER — Other Ambulatory Visit: Payer: Self-pay

## 2021-05-07 ENCOUNTER — Emergency Department (HOSPITAL_BASED_OUTPATIENT_CLINIC_OR_DEPARTMENT_OTHER)
Admission: EM | Admit: 2021-05-07 | Discharge: 2021-05-07 | Disposition: A | Discharge status: Home / Self Care | Payer: Medicaid Other | Attending: Emergency Medicine | Admitting: Emergency Medicine

## 2021-05-07 DIAGNOSIS — N390 Urinary tract infection, site not specified: Secondary | ICD-10-CM | POA: Insufficient documentation

## 2021-05-07 DIAGNOSIS — F17210 Nicotine dependence, cigarettes, uncomplicated: Secondary | ICD-10-CM | POA: Diagnosis not present

## 2021-05-07 DIAGNOSIS — R350 Frequency of micturition: Secondary | ICD-10-CM | POA: Diagnosis present

## 2021-05-07 LAB — URINALYSIS, ROUTINE W REFLEX MICROSCOPIC
Bilirubin Urine: NEGATIVE
Glucose, UA: NEGATIVE mg/dL
Ketones, ur: NEGATIVE mg/dL
Nitrite: POSITIVE — AB
Protein, ur: 100 mg/dL — AB
Specific Gravity, Urine: >=1.03 (ref 1.005–1.030)
pH: 6.5 (ref 5.0–8.0)

## 2021-05-07 LAB — URINALYSIS, MICROSCOPIC (REFLEX): WBC, UA: >50 WBC/hpf (ref 0–5)

## 2021-05-07 LAB — PREGNANCY, URINE: Preg Test, Ur: NEGATIVE

## 2021-05-07 MED ORDER — CEPHALEXIN 500 MG PO CAPS
500.0000 mg | ORAL_CAPSULE | Freq: Three times a day (TID) | ORAL | 0 refills | Status: AC
  Filled 2021-05-07: qty 21, 7d supply, fill #0

## 2021-05-07 MED ORDER — PHENAZOPYRIDINE HCL 200 MG PO TABS
200.0000 mg | ORAL_TABLET | Freq: Three times a day (TID) | ORAL | 0 refills | Status: DC
  Filled 2021-05-07: qty 6, 2d supply, fill #0

## 2021-05-07 NOTE — Discharge Instructions (Addendum)
You have a urinary tract infection.  Please take antibiotics 3 times daily for the next 7 days as directed.  We have sent your urine for culture and you will be called by phone if a different antibiotic would be more appropriate.  You can use prescribed Pyridium (Azo) to help with urinary discomfort and frequency.  You can take this 3 times a day for the next 2 days we do not want you to take it beyond not because it can mask worsening symptoms of your antibiotics or not treating your infection.  If you develop fevers, vomiting, flank pain or other new or concerning symptoms please return for reevaluation.

## 2021-05-07 NOTE — ED Triage Notes (Signed)
Pt c/o lower abdominal pressure and urinary frequency since yesterday. Denies fevers

## 2021-05-07 NOTE — ED Provider Notes (Signed)
[facility] EMERGENCY DEPARTMENT Provider Note   CSN: [id number] Arrival date & time: 05/07/21  0913     History Chief Complaint  Patient presents with   Dysuria    Erica Clements is a 30 y.o. female.  Erica Clements is a 30 y.o. female who is otherwise healthy, presents to the emergency department for evaluation of suprapubic pressure and urinary frequency.  Patient reports symptoms started yesterday and have been constant and worsening.  She denies any associated dysuria.  She has not seen any blood in her urine.  Denies any flank pain.  No fevers or chills.  No nausea or vomiting.  She denies any vaginal discharge or vaginal bleeding.  Has never had a urinary tract infection before.  The history is provided by the patient.      Past Medical History:  Diagnosis Date   Medical history non-contributory    Stomach ulcer     Patient Active Problem List   Diagnosis Date Noted   Encounter for induction of labor 09/23/2018   Pregnancy with adoption planned, third trimester 09/05/2018   History of cryosurgery 08/30/2018   Supervision of high risk pregnancy, antepartum 08/29/2018   Limited prenatal care in third trimester 06/27/2018   Closed displaced transverse fracture of shaft of left femur ([place]) 09/12/2016   Closed supracondylar fracture of left femur [facility])     Past Surgical History:  Procedure Laterality Date   FEMUR IM NAIL Left 09/12/2016   Procedure: INTRAMEDULLARY (IM) NAIL FEMORAL;  Surgeon: Newt Minion, MD;  Location: [facility];  Service: Orthopedics;  Laterality: Left;     OB History     Gravida  4   Para  3   Term  3   Preterm  0   AB  1   Living  3      SAB  1   IAB  0   Ectopic  0   Multiple  0   Live Births  3           Family History  Problem Relation Age of Onset   Cancer Paternal Grandmother    Diabetes Paternal Grandmother     Social History   Tobacco Use   Smoking status: Some Days    Types: Cigarettes   Smokeless  tobacco: Never  Vaping Use   Vaping Use: Never used  Substance Use Topics   Alcohol use: Yes    Comment: Socially   Drug use: No    Home Medications Prior to Admission medications   Medication Sig Start Date End Date Taking? Authorizing Provider  cephALEXin (KEFLEX) 500 MG capsule Take 1 capsule (500 mg total) by mouth 3 (three) times daily for 7 days. 05/07/21 05/14/21 Yes Jacqlyn Larsen, PA-C  phenazopyridine (PYRIDIUM) 200 MG tablet Take 1 tablet (200 mg total) by mouth 3 (three) times daily. 05/07/21  Yes Jacqlyn Larsen, PA-C  famotidine (PEPCID) 40 MG tablet Take 1 tablet (40 mg total) by mouth daily. 12/31/20   Wieters, Hallie C, PA-C  metroNIDAZOLE (FLAGYL) 500 MG tablet Take 1 tablet (500 mg total) by mouth 2 (two) times daily. 01/03/21   Vanessa Kick, MD  omeprazole (PRILOSEC) 20 MG capsule Take 1 capsule (20 mg total) by mouth 2 (two) times daily before a meal. 12/31/20 01/30/21  Wieters, Hallie C, PA-C  ondansetron (ZOFRAN ODT) 4 MG disintegrating tablet Take 1 tablet (4 mg total) by mouth every 8 (eight) hours as needed for nausea or vomiting. 12/31/20  Wieters, Hallie C, PA-C  sucralfate (CARAFATE) 1 g tablet Take 1 tablet (1 g total) by mouth 4 (four) times daily -  with meals and at bedtime. 12/31/20   Wieters, Hallie C, PA-C    Allergies    Patient has no known allergies.  Review of Systems   Review of Systems  Constitutional:  Negative for chills and fever.  HENT: Negative.    Respiratory:  Negative for shortness of breath.   Cardiovascular:  Negative for chest pain.  Gastrointestinal:  Negative for abdominal pain, diarrhea, nausea and vomiting.  Genitourinary:  Positive for frequency. Negative for difficulty urinating, dysuria, flank pain, hematuria, vaginal bleeding and vaginal discharge.  Musculoskeletal:  Negative for back pain.  All other systems reviewed and are negative.  Physical Exam Updated Vital Signs BP 111/67 (BP Location: Left Arm)   Pulse 77   Temp 98.4  F (36.9 C) (Oral)   Resp 16   Ht 5\' 5"  (1.651 m)   Wt 63.5 kg   LMP 04/13/2021 (Approximate)   SpO2 100%   BMI 23.30 kg/m   Physical Exam Vitals and nursing note reviewed.  Constitutional:      General: She is not in acute distress.    Appearance: Normal appearance. She is well-developed. She is not ill-appearing or diaphoretic.  HENT:     Head: Normocephalic and atraumatic.  Eyes:     General:        Right eye: No discharge.        Left eye: No discharge.  Cardiovascular:     Rate and Rhythm: Normal rate and regular rhythm.     Heart sounds: Normal heart sounds.  Pulmonary:     Effort: Pulmonary effort is normal. No respiratory distress.     Breath sounds: Normal breath sounds.  Abdominal:     Tenderness: There is abdominal tenderness. There is no right CVA tenderness or left CVA tenderness.     Comments: Mild suprapubic tenderness but otherwise abdomen is soft, nondistended, nontender to palpation, no CVA tenderness bilaterally  Musculoskeletal:        General: No deformity.     Cervical back: Neck supple.  Skin:    General: Skin is warm and dry.  Neurological:     Mental Status: She is alert and oriented to person, place, and time.     Coordination: Coordination normal.  Psychiatric:        Mood and Affect: Mood normal.        Behavior: Behavior normal.    ED Results / Procedures / Treatments   Labs (all labs ordered are listed, but only abnormal results are displayed) Labs Reviewed  URINE CULTURE -   URINALYSIS, ROUTINE W REFLEX MICROSCOPIC - Abnormal; Notable for the following components:   APPearance CLOUDY (*)    Hgb urine dipstick MODERATE (*)    Protein, ur 100 (*)    Nitrite POSITIVE (*)    Leukocytes,Ua LARGE (*)    All other components within normal limits  URINALYSIS, MICROSCOPIC (REFLEX) - Abnormal; Notable for the following components:   Bacteria, UA MANY (*)    All other components within normal limits  PREGNANCY, URINE     EKG None  Radiology No results found.  Procedures Procedures   Medications Ordered in ED Medications - No data to display  ED Course  I have reviewed the triage vital signs and the nursing notes.  Pertinent labs & imaging results that were available during my care of the patient  were reviewed by me and considered in my medical decision making (see chart for details).    MDM Rules/Calculators/A&P                           Pt has been diagnosed with a UTI. Pt is afebrile, no CVA tenderness, normotensive, and denies N/V. Pt to be dc home with antibiotics and instructions to follow up with PCP if symptoms persist.   Final Clinical Impression(s) / ED Diagnoses Final diagnoses:  Acute UTI    Rx / DC Orders ED Discharge Orders          Ordered    cephALEXin (KEFLEX) 500 MG capsule  3 times daily        05/07/21 1344    phenazopyridine (PYRIDIUM) 200 MG tablet  3 times daily        05/07/21 1344             Jodi Geralds [place], [place] 05/08/21 1744    Tegeler, Canary Brim, MD 05/09/21 1037

## 2021-05-08 ENCOUNTER — Telehealth: Payer: Self-pay

## 2021-05-08 NOTE — Telephone Encounter (Signed)
Transition Care Management Follow-up Telephone Call Date of discharge and from where: 05/07/2021 from [facility] How have you been since you were released from the hospital? Pt stated that she is feeling better and did not have any questions or concerns.  Any questions or concerns? No  Items Reviewed: Did the pt receive and understand the discharge instructions provided? Yes  Medications obtained and verified? Yes  Other? No  Any new allergies since your discharge? No  Dietary orders reviewed? No Do you have support at home? Yes   Functional Questionnaire: (I = Independent and D = Dependent) ADLs: I  Bathing/Dressing- I  Meal Prep- I  Eating- I  Maintaining continence- I  Transferring/Ambulation- I  Managing Meds- I   Follow up appointments reviewed:  PCP Hospital f/u appt confirmed? No  Specialist Hospital f/u appt confirmed? No   Are transportation arrangements needed? No  If their condition worsens, is the pt aware to call PCP or go to the Emergency Dept.? Yes Was the patient provided with contact information for the PCP's office or ED? Yes Was to pt encouraged to call back with questions or concerns? Yes

## 2021-05-09 LAB — URINE CULTURE: Culture: >=100000 — AB

## 2021-05-10 ENCOUNTER — Telehealth: Payer: Self-pay | Admitting: Emergency Medicine

## 2021-05-10 NOTE — Telephone Encounter (Signed)
Post ED Visit - Positive Culture Follow-up  Culture report reviewed by antimicrobial stewardship pharmacist: [facility] []  Nathan Batchelder, Pharm.D. []  , Pharm.D., BCPS AQ-ID []  , Pharm.D., BCPS []  Celedonio Miyamoto, Pharm.D., BCPS []  [place], Garvin Fila.D., BCPS, AAHIVP []  , Pharm.D., BCPS, AAHIVP []  Georgina Pillion, PharmD, BCPS []  , PharmD, BCPS []  [place], PharmD, BCPS [x]  [address], PharmD []  , PharmD, BCPS []  Estella Husk, PharmD  Pharmacy Team []  Lysle Pearl, PharmD []  , PharmD []  Phillips Climes, PharmD []  , Rph []  Agapito Games) , PharmD []  Delmar Landau, PharmD []  , PharmD []  Mervyn Gay, PharmD []  , PharmD []  Vinnie Level, PharmD []  [facility], PharmD []  , PharmD []  Len Childs, PharmD   Positive urine culture Treated with Cephalexin, organism sensitive to the same and no further patient follow-up is required at this time.  Erica Clements 05/10/2021, 12:05 PM

## 2021-05-15 ENCOUNTER — Other Ambulatory Visit (HOSPITAL_BASED_OUTPATIENT_CLINIC_OR_DEPARTMENT_OTHER): Payer: Self-pay

## 2021-05-18 ENCOUNTER — Other Ambulatory Visit (HOSPITAL_BASED_OUTPATIENT_CLINIC_OR_DEPARTMENT_OTHER): Payer: Self-pay

## 2021-08-13 ENCOUNTER — Emergency Department (HOSPITAL_BASED_OUTPATIENT_CLINIC_OR_DEPARTMENT_OTHER)
Admission: EM | Admit: 2021-08-13 | Discharge: 2021-08-13 | Disposition: A | Discharge status: Home / Self Care | Payer: Medicaid Other | Attending: Emergency Medicine | Admitting: Emergency Medicine

## 2021-08-13 ENCOUNTER — Encounter (HOSPITAL_BASED_OUTPATIENT_CLINIC_OR_DEPARTMENT_OTHER): Payer: Self-pay

## 2021-08-13 ENCOUNTER — Other Ambulatory Visit: Payer: Self-pay

## 2021-08-13 DIAGNOSIS — B9689 Other specified bacterial agents as the cause of diseases classified elsewhere: Secondary | ICD-10-CM

## 2021-08-13 DIAGNOSIS — A568 Sexually transmitted chlamydial infection of other sites: Secondary | ICD-10-CM | POA: Insufficient documentation

## 2021-08-13 DIAGNOSIS — A5901 Trichomonal vulvovaginitis: Secondary | ICD-10-CM | POA: Diagnosis not present

## 2021-08-13 DIAGNOSIS — O98311 Other infections with a predominantly sexual mode of transmission complicating pregnancy, first trimester: Secondary | ICD-10-CM

## 2021-08-13 DIAGNOSIS — Z3A00 Weeks of gestation of pregnancy not specified: Secondary | ICD-10-CM | POA: Diagnosis not present

## 2021-08-13 DIAGNOSIS — N760 Acute vaginitis: Secondary | ICD-10-CM | POA: Diagnosis not present

## 2021-08-13 DIAGNOSIS — O23591 Infection of other part of genital tract in pregnancy, first trimester: Secondary | ICD-10-CM | POA: Diagnosis present

## 2021-08-13 LAB — WET PREP, GENITAL
Sperm: NONE SEEN
Trich, Wet Prep: NONE SEEN
WBC, Wet Prep HPF POC: <10 (ref <10)
Yeast Wet Prep HPF POC: NONE SEEN

## 2021-08-13 LAB — URINALYSIS, MICROSCOPIC (REFLEX)

## 2021-08-13 LAB — URINALYSIS, ROUTINE W REFLEX MICROSCOPIC
Bilirubin Urine: NEGATIVE
Glucose, UA: NEGATIVE mg/dL
Ketones, ur: NEGATIVE mg/dL
Nitrite: NEGATIVE
Protein, ur: NEGATIVE mg/dL
Specific Gravity, Urine: 1.025 (ref 1.005–1.030)
pH: 7 (ref 5.0–8.0)

## 2021-08-13 LAB — PREGNANCY, URINE: Preg Test, Ur: POSITIVE — AB

## 2021-08-13 MED ORDER — METRONIDAZOLE 500 MG PO TABS
2000.0000 mg | ORAL_TABLET | Freq: Once | ORAL | Status: AC
  Administered 2021-08-13: 2000 mg via ORAL
  Filled 2021-08-13: qty 4

## 2021-08-13 MED ORDER — AZITHROMYCIN 250 MG PO TABS
1000.0000 mg | ORAL_TABLET | Freq: Once | ORAL | Status: AC
  Administered 2021-08-13: 1000 mg via ORAL
  Filled 2021-08-13: qty 4

## 2021-08-13 MED ORDER — METRONIDAZOLE 500 MG PO TABS: 500.0000 mg | ORAL_TABLET | Freq: Two times a day (BID) | ORAL | 0 refills | Status: DC

## 2021-08-13 MED ORDER — LIDOCAINE HCL (PF) 1 % IJ SOLN
INTRAMUSCULAR | Status: AC
  Administered 2021-08-13: 1 mL
  Filled 2021-08-13: qty 5

## 2021-08-13 MED ORDER — DOXYLAMINE-PYRIDOXINE 10-10 MG PO TBEC: 10.0000 mg | DELAYED_RELEASE_TABLET | Freq: Two times a day (BID) | ORAL | 0 refills | Status: DC

## 2021-08-13 MED ORDER — CEFTRIAXONE SODIUM 500 MG IJ SOLR
500.0000 mg | Freq: Once | INTRAMUSCULAR | Status: AC
  Administered 2021-08-13: 500 mg via INTRAMUSCULAR
  Filled 2021-08-13: qty 500

## 2021-08-13 NOTE — Discharge Instructions (Addendum)
You were seen here today for evaluation of possible STDs and pregnancy.  You do have a positive pregnancy test here.  You tested positive for trichomoniasis.  Your gonorrhea and chlamydia would not come back for a few days, however you have chosen to be prophylactically treated for these today.  You were treated for trichomoniasis, gonorrhea, and chlamydia with the antibiotics given to you today.  You will need to abstain from sex for at least a week to prevent reinfection.  You will also need to call your partners to let them know that you tested positive for at least trichomoniasis.  STDs and pregnancy are dangerous and do not come without side effects.  Please make sure that you are practicing safe sex to prevent further STD infections.  You will need to follow-up with your established OB/GYN for ultrasound and further testing.    I have sent in a prescription for Diclegis which is an antinausea medication use in pregnancy, please take this at night as it can make you sleepy. You can increase to two times daily if needed for worsening nausea/vomiting.   Additionally, you have bacterial vaginosis. This will need to be treated with metronidazole twice a day for the next week.   If you have any abdominal pain, worsening nausea, vomiting, vaginal bleeding, or worsening vaginal discharge, please return to the nearest emergency department for reevaluation.

## 2021-08-13 NOTE — ED Provider Notes (Signed)
[facility] EMERGENCY DEPARTMENT Provider Note   CSN: [id number] Arrival date & time: 08/13/21  0957     History Chief Complaint  Patient presents with   Vaginal Discharge    Erica Clements is a 31 y.o. female G7P3 presents to the ED for evaluation of watery, white vaginal discharge for the past 2 days.  She denies any foul smell, abdominal pain, vomiting, dysuria, hematuria, urinary urgency/frequency, or vaginal bleeding.  She reports she had a positive pregnancy test 2 weeks ago and her last menstrual period was 06-10-2021.  She reports she has not called an OB to schedule an appointment or taking any prenatals.  She is concerned for STDs and would like testing.  Additionally, she reports she has been having some nausea.  No vomiting.  She denies any medical or surgical history.  Denies any daily medications.  No known drug allergies.  She denies any tobacco, EtOH, or illicit drug use ever.   Vaginal Discharge Associated symptoms: nausea   Associated symptoms: no abdominal pain, no dysuria, no fever and no vomiting       Home Medications Prior to Admission medications   Medication Sig Start Date End Date Taking? Authorizing Provider  Doxylamine-Pyridoxine (DICLEGIS) 10-10 MG TBEC Take 10 mg by mouth 2 (two) times daily. 08/13/21  Yes Sherrell Puller, PA-C  famotidine (PEPCID) 40 MG tablet Take 1 tablet (40 mg total) by mouth daily. 12/31/20   Wieters, Hallie C, PA-C  metroNIDAZOLE (FLAGYL) 500 MG tablet Take 1 tablet (500 mg total) by mouth 2 (two) times daily. 08/13/21   Sherrell Puller, PA-C  omeprazole (PRILOSEC) 20 MG capsule Take 1 capsule (20 mg total) by mouth 2 (two) times daily before a meal. 12/31/20 01/30/21  Wieters, Hallie C, PA-C  ondansetron (ZOFRAN ODT) 4 MG disintegrating tablet Take 1 tablet (4 mg total) by mouth every 8 (eight) hours as needed for nausea or vomiting. 12/31/20   Wieters, Hallie C, PA-C  phenazopyridine (PYRIDIUM) 200 MG tablet Take 1 tablet (200 mg total)  by mouth 3 (three) times daily. 05/07/21   Jacqlyn Larsen, PA-C  sucralfate (CARAFATE) 1 g tablet Take 1 tablet (1 g total) by mouth 4 (four) times daily -  with meals and at bedtime. 12/31/20   Wieters, Hallie C, PA-C      Allergies    Patient has no known allergies.    Review of Systems   Review of Systems  Constitutional:  Negative for chills and fever.  Gastrointestinal:  Positive for nausea. Negative for abdominal pain, constipation, diarrhea and vomiting.  Genitourinary:  Positive for vaginal discharge. Negative for dysuria, frequency, hematuria, urgency and vaginal bleeding.   Physical Exam Updated Vital Signs BP (!) 106/55 (BP Location: Right Arm)    Pulse 64    Temp 98.5 F (36.9 C) (Oral)    Resp 18    Ht 5\' 5"  (1.651 m)    Wt 63.5 kg    SpO2 100%    BMI 23.30 kg/m  Physical Exam Vitals and nursing note reviewed. Exam conducted with a chaperone present Ramond Craver, EMT).  Constitutional:      General: She is not in acute distress.    Appearance: Normal appearance. She is not ill-appearing or toxic-appearing.  HENT:     Head: Normocephalic and atraumatic.     Mouth/Throat:     Mouth: Mucous membranes are moist.     Pharynx: No oropharyngeal exudate or posterior oropharyngeal erythema.     Comments: Orthodontic braces.  Eyes:     General: No scleral icterus. Cardiovascular:     Rate and Rhythm: Normal rate and regular rhythm.  Pulmonary:     Effort: Pulmonary effort is normal. No respiratory distress.     Breath sounds: Normal breath sounds.  Abdominal:     General: Abdomen is flat. Bowel sounds are normal.     Palpations: Abdomen is soft.     Tenderness: There is no abdominal tenderness. There is no right CVA tenderness, left CVA tenderness, guarding or rebound.  Genitourinary:    Exam position: Lithotomy position.     Labia:        Right: No rash or lesion.        Left: No rash or lesion.      Vagina: Vaginal discharge present. No erythema, tenderness or bleeding.      Cervix: No cervical motion tenderness, friability, lesion, erythema or cervical bleeding.     Comments: Moderate amount of thin white discharge in the vaginal canal.  No bleeding noted.  No CMT. Musculoskeletal:        General: No deformity.     Cervical back: Normal range of motion.  Skin:    General: Skin is warm and dry.  Neurological:     General: No focal deficit present.     Mental Status: She is alert. Mental status is at baseline.    ED Results / Procedures / Treatments   Labs (all labs ordered are listed, but only abnormal results are displayed) Labs Reviewed  WET PREP, GENITAL - Abnormal; Notable for the following components:      Result Value   Clue Cells Wet Prep HPF POC PRESENT (*)    All other components within normal limits  URINALYSIS, ROUTINE W REFLEX MICROSCOPIC - Abnormal; Notable for the following components:   Hgb urine dipstick TRACE (*)    Leukocytes,Ua SMALL (*)    All other components within normal limits  PREGNANCY, URINE - Abnormal; Notable for the following components:   Preg Test, Ur POSITIVE (*)    All other components within normal limits  URINALYSIS, MICROSCOPIC (REFLEX) - Abnormal; Notable for the following components:   Bacteria, UA RARE (*)    Trichomonas, UA PRESENT (*)    All other components within normal limits  GC/CHLAMYDIA PROBE AMP ([place]) NOT AT [facility]    EKG None  Radiology No results found.  Procedures Procedures   Medications Ordered in ED Medications  metroNIDAZOLE (FLAGYL) tablet 2,000 mg (2,000 mg Oral Given 08/13/21 1246)  cefTRIAXone (ROCEPHIN) injection 500 mg (500 mg Intramuscular Given 08/13/21 1253)  azithromycin (ZITHROMAX) tablet 1,000 mg (1,000 mg Oral Given 08/13/21 1249)  lidocaine (PF) (XYLOCAINE) 1 % injection (1 mL  Given 08/13/21 1255)    ED Course/ Medical Decision Making/ A&P                           Medical Decision Making Amount and/or Complexity of Data Reviewed Labs:  ordered.  Risk Prescription drug management.   31 year old female presents to the emergency department for evaluation of white watery vaginal discharge and concern for STD.  Differential diagnosis includes was not limited to bacterial vaginosis, STD, and/or PID. Doubt any ovarian torsion or ectopic pregnancy as the patient does not have any abdominal pain or abdominal tenderness on exam.  Vital signs show mild hypotension at 106/55.  Afebrile, satting well on room air with a normal heart rate.  Exam was unremarkable  other than her GU exam which showed moderate amount of thin white discharge in the vaginal canal.  No cervical motion tenderness.  No bleeding noted.  No lesions noted.  Wet prep obtained.  I independently reviewed and interpreted the patient's labs.  For her urinalysis, there was trace blood and small leuks seen but rare bacteria with 6-10 white blood cells seen.  Trichomonas present in urine.  Positive urine pregnancy test.  Urinalysis not consistent with UTI as there is rare bacteria seen but 6-10 white blood cells, this is likely due to her STD.  Wet prep obtained and clue cells are present with less than 10 white blood cells and no trichomoniasis were seen.  I am not sure why trichomonas was seen in her urine, but not her wet prep, but will will treat anyway.  At this time, I have a low suspicion for PID given the no abdominal tenderness or CMT.  I discussed with the patient that she was tested positive for trichomonas and that her gonorrhea committee may not come back for a few days.  I discussed with her empirically treating for gonorrhea committee a versus waiting for positive results.  The patient reports she would just like to be treated for gonorrhea and chlamydia now.  I called and spoke to Len Blalock, MAU APP about treating STDs in the first trimester as well as for bacterial vaginosis.  She reported if I am concerned for compliancy to give one-time dosings of Rocephin,  azithromycin, and metronidazole.  Later on, the patient had clue cells which is positive for bacterial vaginosis I discussed this with Anderson Malta, another MAU APP, who discussed that you can treat bacterial vaginosis in the first trimester with metronidazole 500 mg twice daily for the next 7 days.  The patient was given one-time doses of Rocephin, azithromycin, and 2 g metronidazole and was sent home on 500 mg twice daily for the next 7 days to treat her BV.  I discussed with her that we treat her for trichomonas as well prophylactic treat her for gonorrhea and chlamydia.  She will need to take the metronidazole at home for treatment of her BV as well.  I discussed with her that she will need to inform her sexual partners as they will need to be tested and treated as well.  I advised her to abstain from any sexual intercourse for the next 2 weeks as to prevent reinfection on herself and for reinfecting others.  I discussed with her that sexual transmitted diseases do not come without risk for herself and for her pregnancy.  She verbalizes understanding.  For her nausea, I did send her home with Diclegis to take up to twice daily.  I advised her to start taking prenatal vitamins today as she is in her first trimester and her fetus is forming important body parts and structures.  I informed her that she will need to get in contact with her OB/GYN.  She reports she does have a OB/GYN and will follow-up.  Return precautions were discussed.  Patient agrees to plan.  Patient is stable and being discharged home in good condition.  Final Clinical Impression(s) / ED Diagnoses Final diagnoses:  Vaginal trichomoniasis  Sexually transmitted infection in mother during first trimester of pregnancy  Bacterial vaginosis    Rx / DC Orders ED Discharge Orders          Ordered    Doxylamine-Pyridoxine (DICLEGIS) 10-10 MG TBEC  2 times daily  08/13/21 1249    metroNIDAZOLE (FLAGYL) 500 MG tablet  2 times daily         08/13/21 1303              Sherrell Puller, [place] 08/14/21 1424    Fredia Sorrow, MD 08/16/21 [phone]

## 2021-08-13 NOTE — ED Notes (Signed)
ED Provider at bedside. 

## 2021-08-13 NOTE — ED Triage Notes (Signed)
Pt to er, pt states that she is here for  "check up" pt states that she is having some vaginal discharge and wants to get tested for STD.

## 2021-08-14 ENCOUNTER — Telehealth: Payer: Self-pay

## 2021-08-14 LAB — GC/CHLAMYDIA PROBE AMP (~~LOC~~) NOT AT ARMC
Chlamydia: NEGATIVE
Comment: NEGATIVE
Comment: NORMAL
Neisseria Gonorrhea: POSITIVE — AB

## 2021-08-14 NOTE — Telephone Encounter (Signed)
Transition Care Management Unsuccessful Follow-up Telephone Call  Date of discharge and from where:  08/13/2021 from [facility]  Attempts:  1st Attempt  Reason for unsuccessful TCM follow-up call:  Left voice message

## 2021-08-14 NOTE — Telephone Encounter (Signed)
Transition Care Management Follow-up Telephone Call Date of discharge and from where: 08/13/2021 from [facility] How have you been since you were released from the hospital? Patient stated that she is feeling better and did not have any questions or concerns at this time.  Any questions or concerns? No  Items Reviewed: Did the pt receive and understand the discharge instructions provided? Yes  Medications obtained and verified? Yes  Other? No  Any new allergies since your discharge? No  Dietary orders reviewed? No Do you have support at home? Yes   Functional Questionnaire: (I = Independent and D = Dependent) ADLs: I  Bathing/Dressing- I  Meal Prep- I  Eating- I  Maintaining continence- I  Transferring/Ambulation- I  Managing Meds- I   Follow up appointments reviewed:  PCP Hospital f/u appt confirmed? No  Patient does not wish to est at this time.  Specialist Hospital f/u appt confirmed? No   Are transportation arrangements needed? No  If their condition worsens, is the pt aware to call PCP or go to the Emergency Dept.? Yes Was the patient provided with contact information for the PCP's office or ED? Yes Was to pt encouraged to call back with questions or concerns? Yes

## 2021-08-19 ENCOUNTER — Other Ambulatory Visit (HOSPITAL_BASED_OUTPATIENT_CLINIC_OR_DEPARTMENT_OTHER): Payer: Self-pay

## 2021-08-20 ENCOUNTER — Other Ambulatory Visit (HOSPITAL_COMMUNITY): Payer: Self-pay

## 2021-10-10 ENCOUNTER — Emergency Department (HOSPITAL_COMMUNITY)
Admission: EM | Admit: 2021-10-10 | Discharge: 2021-10-10 | Disposition: A | Discharge status: Home / Self Care | Payer: Medicaid Other | Attending: Emergency Medicine | Admitting: Emergency Medicine

## 2021-10-10 ENCOUNTER — Encounter (HOSPITAL_COMMUNITY): Payer: Self-pay | Admitting: Emergency Medicine

## 2021-10-10 ENCOUNTER — Other Ambulatory Visit: Payer: Self-pay

## 2021-10-10 DIAGNOSIS — N939 Abnormal uterine and vaginal bleeding, unspecified: Secondary | ICD-10-CM | POA: Insufficient documentation

## 2021-10-10 DIAGNOSIS — Z711 Person with feared health complaint in whom no diagnosis is made: Secondary | ICD-10-CM

## 2021-10-10 DIAGNOSIS — B9689 Other specified bacterial agents as the cause of diseases classified elsewhere: Secondary | ICD-10-CM

## 2021-10-10 DIAGNOSIS — N760 Acute vaginitis: Secondary | ICD-10-CM | POA: Diagnosis not present

## 2021-10-10 LAB — WET PREP, GENITAL
Sperm: NONE SEEN
Trich, Wet Prep: NONE SEEN
WBC, Wet Prep HPF POC: <10 (ref <10)
Yeast Wet Prep HPF POC: NONE SEEN

## 2021-10-10 LAB — URINALYSIS, ROUTINE W REFLEX MICROSCOPIC
Bilirubin Urine: NEGATIVE
Glucose, UA: NEGATIVE mg/dL
Ketones, ur: NEGATIVE mg/dL
Leukocytes,Ua: NEGATIVE
Nitrite: NEGATIVE
Protein, ur: 100 mg/dL — AB
Specific Gravity, Urine: 1.021 (ref 1.005–1.030)
pH: 8 (ref 5.0–8.0)

## 2021-10-10 LAB — PREGNANCY, URINE: Preg Test, Ur: NEGATIVE

## 2021-10-10 MED ORDER — LIDOCAINE HCL (PF) 1 % IJ SOLN
1.0000 mL | Freq: Once | INTRAMUSCULAR | Status: AC
  Administered 2021-10-10: 1 mL
  Filled 2021-10-10: qty 30

## 2021-10-10 MED ORDER — CEFTRIAXONE SODIUM 1 G IJ SOLR
500.0000 mg | Freq: Once | INTRAMUSCULAR | Status: AC
  Administered 2021-10-10: 500 mg via INTRAMUSCULAR
  Filled 2021-10-10: qty 10

## 2021-10-10 MED ORDER — METRONIDAZOLE 500 MG PO TABS: 500.0000 mg | ORAL_TABLET | Freq: Two times a day (BID) | ORAL | 0 refills | Status: DC

## 2021-10-10 MED ORDER — DOXYCYCLINE HYCLATE 100 MG PO TABS: 100.0000 mg | ORAL_TABLET | Freq: Two times a day (BID) | ORAL | 0 refills | Status: AC

## 2021-10-10 NOTE — ED Notes (Signed)
Pelvic exam material at bedside. ?

## 2021-10-10 NOTE — ED Provider Notes (Signed)
?[facility]-EMERGENCY DEPT ?Provider Note ? ? ?CSN: [id number] ?Arrival date & time: 10/10/21  0959 ? ?  ? ?History ? ?No chief complaint on file. ? ? ?Erica Clements is a 31 y.o. female with past medical history of trichomonas, and stomach ulcer.  Presents emergency department with a chief complaint of vaginal discharge.  Patient reports that she has been having yellow-white vaginal discharge for the last 3 days.  Patient also ports that discharge is a fishy odor.  Patient states that she had a miscarriage on the 16th.  Patient did follow-up with OB/GYN provider after this.  Patient states that she has not had a menstrual period since her miscarriage.  Patient is sexually active with a female partner in a mutually monogamous relationship.  Patient does not use any forms of birth control or condoms for penetrative vaginal sex. ? ?Denies any fever, chills, abdominal pain, nausea, vomiting, dysuria, hematuria, urinary urgency, urinary frequency, vaginal pain, vaginal bleeding, pelvic pain. ? ?HPI ? ?  ? ?Home Medications ?Prior to Admission medications   ?Medication Sig Start Date End Date Taking? Authorizing Provider  ?Doxylamine-Pyridoxine (DICLEGIS) 10-10 MG TBEC Take 10 mg by mouth 2 (two) times daily. 08/13/21   Achille Rich, PA-C  ?famotidine (PEPCID) 40 MG tablet Take 1 tablet (40 mg total) by mouth daily. 12/31/20   Wieters, Hallie C, PA-C  ?metroNIDAZOLE (FLAGYL) 500 MG tablet Take 1 tablet (500 mg total) by mouth 2 (two) times daily. 08/13/21   Achille Rich, PA-C  ?omeprazole (PRILOSEC) 20 MG capsule Take 1 capsule (20 mg total) by mouth 2 (two) times daily before a meal. 12/31/20 01/30/21  Wieters, Hallie C, PA-C  ?ondansetron (ZOFRAN ODT) 4 MG disintegrating tablet Take 1 tablet (4 mg total) by mouth every 8 (eight) hours as needed for nausea or vomiting. 12/31/20   Wieters, Hallie C, PA-C  ?phenazopyridine (PYRIDIUM) 200 MG tablet Take 1 tablet (200 mg total) by mouth 3 (three) times daily.  05/07/21   Dartha Lodge, PA-C  ?sucralfate (CARAFATE) 1 g tablet Take 1 tablet (1 g total) by mouth 4 (four) times daily -  with meals and at bedtime. 12/31/20   Wieters, Hallie C, PA-C  ?   ? ?Allergies    ?Patient has no known allergies.   ? ?Review of Systems   ?Review of Systems  ?Constitutional:  Negative for chills and fever.  ?Gastrointestinal:  Negative for abdominal pain, nausea and vomiting.  ?Genitourinary:  Positive for vaginal discharge. Negative for decreased urine volume, difficulty urinating, dysuria, flank pain, frequency, genital sores, hematuria, pelvic pain, urgency, vaginal bleeding and vaginal pain.  ?Neurological:  Negative for syncope and light-headedness.  ? ?Physical Exam ?Updated Vital Signs ?BP 99/63 (BP Location: Right Arm)   Pulse 73   Temp 98.4 ?F (36.9 ?C) (Oral)   Resp 16   SpO2 100%  ?Physical Exam ?Vitals and nursing note reviewed. Exam conducted with a chaperone present (Female RN present as chaperone).  ?Constitutional:   ?   General: She is not in acute distress. ?   Appearance: She is not ill-appearing, toxic-appearing or diaphoretic.  ?HENT:  ?   Head: Normocephalic.  ?Eyes:  ?   General: No scleral icterus.    ?   Right eye: No discharge.     ?   Left eye: No discharge.  ?Cardiovascular:  ?   Rate and Rhythm: Normal rate.  ?Pulmonary:  ?   Effort: Pulmonary effort is normal.  ?Abdominal:  ?  General: Abdomen is flat. Bowel sounds are normal. There is no distension. There are no signs of injury.  ?   Palpations: Abdomen is soft. There is no mass or pulsatile mass.  ?   Tenderness: There is no abdominal tenderness. There is no guarding or rebound.  ?   Hernia: There is no hernia in the umbilical area, ventral area, left inguinal area or right inguinal area.  ?Genitourinary: ?   General: Normal vulva.  ?   Pubic Area: No rash or pubic lice.   ?   Tanner stage (genital): 5.  ?   Labia:     ?   Right: No rash, tenderness, lesion or injury.     ?   Left: No rash, tenderness,  lesion or injury.   ?   Vagina: No signs of injury and foreign body. Bleeding present. No vaginal discharge, erythema, tenderness, lesions or prolapsed vaginal walls.  ?   Cervix: Cervical bleeding present. No cervical motion tenderness, discharge, friability, lesion, erythema or eversion.  ?   Uterus: Not enlarged and not tender.   ?   Adnexa:     ?   Right: No mass, tenderness or fullness.      ?   Left: No mass, tenderness or fullness.    ?   Comments: Vaginal bleeding noted with blood coming from cervical os and found within vaginal vault. ?Lymphadenopathy:  ?   Lower Body: No right inguinal adenopathy. No left inguinal adenopathy.  ?Skin: ?   General: Skin is warm and dry.  ?Neurological:  ?   General: No focal deficit present.  ?   Mental Status: She is alert.  ?   GCS: GCS eye subscore is 4. GCS verbal subscore is 5. GCS motor subscore is 6.  ?Psychiatric:     ?   Behavior: Behavior is cooperative.  ? ? ?ED Results / Procedures / Treatments   ?Labs ?(all labs ordered are listed, but only abnormal results are displayed) ?Labs Reviewed  ?WET PREP, GENITAL - Abnormal; Notable for the following components:  ?    Result Value  ? Clue Cells Wet Prep HPF POC PRESENT (*)   ? All other components within normal limits  ?URINALYSIS, ROUTINE W REFLEX MICROSCOPIC - Abnormal; Notable for the following components:  ? APPearance CLOUDY (*)   ? Hgb urine dipstick MODERATE (*)   ? Protein, ur 100 (*)   ? Bacteria, UA RARE (*)   ? All other components within normal limits  ?PREGNANCY, URINE  ?GC/CHLAM[facility]DIA PROBE AMP ([place]  ? ? ?EKG ?None ? ?Radiology ?No results found. ? ?Procedures ?Procedures  ? ? ?Medications Ordered in ED ?Medications  ?cefTRIAXone (ROCEPHIN) injection 500 mg (500 mg Intramuscular Given 10/10/21 1130)  ?lidocaine (PF) (XYLOCAINE) 1 % injection 1 mL (1 mL Other Given 10/10/21 1130)  ? ? ?ED Course/ Medical Decision Making/ A&P ?  ?                        ?Medical Decision Making ?Amount and/or  Complexity of Data Reviewed ?Labs: ordered. ? ?Risk ?Prescription drug management. ? ? ?Alert 31 year old female in no acute distress, nontoxic-appearing.  Presents to the emergency department for complaint of vaginal discharge. ? ?Information obtained from patient.  Past medical records reviewed including previous provider notes and labs.  Patient has past medical history as outlined in HPI which complicates her care. ? ?I discussed testing for HIV and  syphilis with the patient.  Patient declines HIV and syphilis testing at this time.  Patient given information to follow-up with health department if she wants this testing in the future.  Shared decision making with patient about empiric treatment for gonorrhea and chlamydia.  Patient is agreeable for empiric treatment at this time. ? ?I personally viewed and interpreted patient's lab results.  Pertinent findings include: ?-Urinalysis shows bacteria rare, leukocyte negative, nitrite negative, WBC 6-10. ?-Wet prep positive for bacterial vaginosis ?-Urine pregnancy test negative ? ?With negative urine pregnancy test suspect that patient's vaginal bleeding is a started her menstrual period.  Patient advised to follow-up closely with her OB/GYN provider for repeat assessment due to her recent miscarriage. ? ?We will treat patient with ceftriaxone and doxycycline for empiric treatment of gonorrhea and chlamydia.  We will treat patient with Flagyl for her bacterial vaginosis.  Discussed side effects of both doxycycline and Flagyl medication. ? ?Discussed results, findings, treatment and follow up. Patient advised of return precautions. Patient verbalized understanding and agreed with plan. ? ? ? ? ? ? ? ? ?Final Clinical Impression(s) / ED Diagnoses ?Final diagnoses:  ?None  ? ? ?Rx / DC Orders ?ED Discharge Orders   ? ?      Ordered  ?  doxycycline (VIBRA-TABS) 100 MG tablet  2 times daily       ? 10/10/21 1209  ?  metroNIDAZOLE (FLAGYL) 500 MG tablet  2 times daily        ? 10/10/21 1211  ? ?  ?  ? ?  ? ? ?  ?Haskel Schroeder, PA-C ?10/10/21 1514 ? ?  ?Linwood Dibbles, MD ?10/11/21 [phone] ? ?

## 2021-10-10 NOTE — Discharge Instructions (Signed)
Today you have been tested for gonorrhea and chlamydia.  The test to determine if you have these will take a few days. They will only call you if your tests come back positive, no news is good news. In the result that your tests are positive you have already been treated.  As part of your treatment please take the antibiotic, Doxycycline, every 12 hours until gone. A side effect of this medication includes hypersensitivity to the suns rays - please take measures to protect your skin from the sun while taking this medication. ? ?Your wet prep showed that you are positive for bacterial vaginosis.  Due to this you were started on the antibiotic metronidazole.  It is very important that you do not combine this medication with alcohol as it will make you violently ill. ? ?Your pregnancy test was negative here today.  The vaginal bleeding he started experiencing is likely due to your menstrual period.  Due to your recent miscarriage please follow-up closely with your OB/GYN provider. ? ?Please do not have any sexual activity for the next two weeks.  After that please make sure that you always use a condom every time you have sex.  If you have any new or concerning symptoms please seek additional medical care and evaluation.     ?

## 2021-10-10 NOTE — ED Triage Notes (Signed)
Patient c/o yellow vaginal discharge for "a couple of days." Recent miscarriage and no period since that time. ?

## 2021-10-12 ENCOUNTER — Telehealth: Payer: Self-pay

## 2021-10-12 LAB — GC/CHLAMYDIA PROBE AMP (~~LOC~~) NOT AT ARMC
Chlamydia: POSITIVE — AB
Comment: NEGATIVE
Comment: NORMAL
Neisseria Gonorrhea: NEGATIVE

## 2021-10-12 NOTE — Telephone Encounter (Signed)
Transition Care Management Unsuccessful Follow-up Telephone Call ? ?Date of discharge and from where:  10/10/2021-[place]  ? ?Attempts:  1st Attempt ? ?Reason for unsuccessful TCM follow-up call:  Voice mail full ? ?  ?

## 2021-10-13 NOTE — Telephone Encounter (Signed)
Transition Care Management Unsuccessful Follow-up Telephone Call ? ?Date of discharge and from where:  10/10/2021-[place]  ? ?Attempts:  2nd Attempt ? ?Reason for unsuccessful TCM follow-up call:  Voice mail full ? ?  ?

## 2021-10-14 NOTE — Telephone Encounter (Signed)
Transition Care Management Unsuccessful Follow-up Telephone Call ? ?Date of discharge and from where:  10/10/2021-[place]  ? ?Attempts:  3rd Attempt ? ?Reason for unsuccessful TCM follow-up call:  Voice mail full ? ?  ?

## 2022-01-29 ENCOUNTER — Other Ambulatory Visit: Payer: Self-pay

## 2022-01-29 ENCOUNTER — Encounter (HOSPITAL_COMMUNITY): Payer: Self-pay | Admitting: Emergency Medicine

## 2022-01-29 ENCOUNTER — Emergency Department (HOSPITAL_COMMUNITY): Payer: Medicaid Other

## 2022-01-29 ENCOUNTER — Emergency Department (HOSPITAL_COMMUNITY)
Admission: EM | Admit: 2022-01-29 | Discharge: 2022-01-29 | Disposition: A | Discharge status: Home / Self Care | Payer: Medicaid Other | Attending: Emergency Medicine | Admitting: Emergency Medicine

## 2022-01-29 DIAGNOSIS — B9689 Other specified bacterial agents as the cause of diseases classified elsewhere: Secondary | ICD-10-CM | POA: Diagnosis not present

## 2022-01-29 DIAGNOSIS — R1011 Right upper quadrant pain: Secondary | ICD-10-CM | POA: Diagnosis not present

## 2022-01-29 DIAGNOSIS — N760 Acute vaginitis: Secondary | ICD-10-CM | POA: Diagnosis not present

## 2022-01-29 DIAGNOSIS — R109 Unspecified abdominal pain: Secondary | ICD-10-CM | POA: Diagnosis not present

## 2022-01-29 DIAGNOSIS — R1010 Upper abdominal pain, unspecified: Secondary | ICD-10-CM

## 2022-01-29 LAB — COMPREHENSIVE METABOLIC PANEL
ALT: 14 U/L (ref 0–44)
AST: 14 U/L — ABNORMAL LOW (ref 15–41)
Albumin: 3.8 g/dL (ref 3.5–5.0)
Alkaline Phosphatase: 44 U/L (ref 38–126)
Anion gap: 7 (ref 5–15)
BUN: 10 mg/dL (ref 6–20)
CO2: 24 mmol/L (ref 22–32)
Calcium: 8.9 mg/dL (ref 8.9–10.3)
Chloride: 104 mmol/L (ref 98–111)
Creatinine, Ser: 0.53 mg/dL (ref 0.44–1.00)
GFR, Estimated: >60 mL/min (ref >60)
Glucose, Bld: 83 mg/dL (ref 70–99)
Potassium: 3.5 mmol/L (ref 3.5–5.1)
Sodium: 135 mmol/L (ref 135–145)
Total Bilirubin: 0.5 mg/dL (ref 0.3–1.2)
Total Protein: 7.6 g/dL (ref 6.5–8.1)

## 2022-01-29 LAB — URINALYSIS, ROUTINE W REFLEX MICROSCOPIC
Bilirubin Urine: NEGATIVE
Glucose, UA: NEGATIVE mg/dL
Ketones, ur: NEGATIVE mg/dL
Leukocytes,Ua: NEGATIVE
Nitrite: NEGATIVE
Protein, ur: NEGATIVE mg/dL
Specific Gravity, Urine: 1.02 (ref 1.005–1.030)
pH: 6 (ref 5.0–8.0)

## 2022-01-29 LAB — CBC WITH DIFFERENTIAL/PLATELET
Abs Immature Granulocytes: 0.01 10*3/uL (ref 0.00–0.07)
Basophils Absolute: 0 10*3/uL (ref 0.0–0.1)
Basophils Relative: 1 %
Eosinophils Absolute: 0.1 10*3/uL (ref 0.0–0.5)
Eosinophils Relative: 2 %
HCT: 38.7 % (ref 36.0–46.0)
Hemoglobin: 13.2 g/dL (ref 12.0–15.0)
Immature Granulocytes: 0 %
Lymphocytes Relative: 32 %
Lymphs Abs: 1.6 10*3/uL (ref 0.7–4.0)
MCH: 30.5 pg (ref 26.0–34.0)
MCHC: 34.1 g/dL (ref 30.0–36.0)
MCV: 89.4 fL (ref 80.0–100.0)
Monocytes Absolute: 0.4 10*3/uL (ref 0.1–1.0)
Monocytes Relative: 7 %
Neutro Abs: 2.8 10*3/uL (ref 1.7–7.7)
Neutrophils Relative %: 58 %
Platelets: 323 10*3/uL (ref 150–400)
RBC: 4.33 MIL/uL (ref 3.87–5.11)
RDW: 11.9 % (ref 11.5–15.5)
WBC: 4.9 10*3/uL (ref 4.0–10.5)
nRBC: 0 % (ref 0.0–0.2)

## 2022-01-29 LAB — WET PREP, GENITAL
Sperm: NONE SEEN
Trich, Wet Prep: NONE SEEN
WBC, Wet Prep HPF POC: <10 (ref <10)
Yeast Wet Prep HPF POC: NONE SEEN

## 2022-01-29 LAB — LIPASE, BLOOD: Lipase: 26 U/L (ref 11–51)

## 2022-01-29 LAB — PREGNANCY, URINE: Preg Test, Ur: NEGATIVE

## 2022-01-29 MED ORDER — METRONIDAZOLE 500 MG PO TABS: 500.0000 mg | ORAL_TABLET | Freq: Two times a day (BID) | ORAL | 0 refills | Status: AC

## 2022-01-29 MED ORDER — ALUM & MAG HYDROXIDE-SIMETH 200-200-20 MG/5ML PO SUSP
30.0000 mL | Freq: Once | ORAL | Status: AC
  Administered 2022-01-29: 30 mL via ORAL
  Filled 2022-01-29: qty 30

## 2022-01-29 MED ORDER — OMEPRAZOLE 20 MG PO CPDR: 20.0000 mg | DELAYED_RELEASE_CAPSULE | Freq: Every day | ORAL | 0 refills | Status: DC

## 2022-01-29 MED ORDER — LIDOCAINE VISCOUS HCL 2 % MT SOLN
15.0000 mL | Freq: Once | OROMUCOSAL | Status: AC
  Administered 2022-01-29: 15 mL via ORAL
  Filled 2022-01-29: qty 15

## 2022-01-29 NOTE — ED Triage Notes (Signed)
Pt reports abd cramping. Pt denies n/v/d. Denies any other s/s. Denies her menstrual cycle. States she took a pregnancy test which was negative.

## 2022-01-29 NOTE — Discharge Instructions (Signed)
Your history, exam, and evaluation today were overall reassuring but did reveal evidence of recurrent bacterial vaginosis.  As your symptoms of upper abdominal discomfort resolved after the GI cocktail and your labs and ultrasound were reassuring I suspect this is likely irritation in your stomach and early GI tract similar to what you have had in the past.  Please start the Prilosec and follow-up with a primary doctor.  Please rest and stay hydrated.  Please take the antibiotics for the BV.  If any symptoms change or worsen acutely, please return to the nearest emergency room.

## 2022-01-29 NOTE — ED Provider Notes (Signed)
[facility]-EMERGENCY DEPT Provider Note   CSN: [id number] Arrival date & time: 01/29/22  4098     History  Chief Complaint  Patient presents with   Abdominal Cramping    Erica Clements is a 31 y.o. female.  The history is provided by the patient and medical records. No language interpreter was used.  Abdominal Cramping This is a new problem. The current episode started 2 days ago. The problem occurs constantly. The problem has not changed since onset.Associated symptoms include abdominal pain. Pertinent negatives include no chest pain, no headaches and no shortness of breath. Nothing aggravates the symptoms. Nothing relieves the symptoms. She has tried nothing for the symptoms. The treatment provided no relief.       Home Medications Prior to Admission medications   Medication Sig Start Date End Date Taking? Authorizing Provider  Doxylamine-Pyridoxine (DICLEGIS) 10-10 MG TBEC Take 10 mg by mouth 2 (two) times daily. 08/13/21   Achille Rich, PA-C  famotidine (PEPCID) 40 MG tablet Take 1 tablet (40 mg total) by mouth daily. 12/31/20   Wieters, Hallie C, PA-C  metroNIDAZOLE (FLAGYL) 500 MG tablet Take 1 tablet (500 mg total) by mouth 2 (two) times daily. 10/10/21   Haskel Schroeder, PA-C  omeprazole (PRILOSEC) 20 MG capsule Take 1 capsule (20 mg total) by mouth 2 (two) times daily before a meal. 12/31/20 01/30/21  Wieters, Hallie C, PA-C  ondansetron (ZOFRAN ODT) 4 MG disintegrating tablet Take 1 tablet (4 mg total) by mouth every 8 (eight) hours as needed for nausea or vomiting. 12/31/20   Wieters, Hallie C, PA-C  phenazopyridine (PYRIDIUM) 200 MG tablet Take 1 tablet (200 mg total) by mouth 3 (three) times daily. 05/07/21   Dartha Lodge, PA-C  sucralfate (CARAFATE) 1 g tablet Take 1 tablet (1 g total) by mouth 4 (four) times daily -  with meals and at bedtime. 12/31/20   Wieters, Hallie C, PA-C      Allergies    Patient has no known allergies.    Review of  Systems   Review of Systems  Constitutional:  Negative for chills, diaphoresis, fatigue and fever.  HENT:  Negative for congestion.   Eyes:  Negative for visual disturbance.  Respiratory:  Negative for cough, chest tightness, shortness of breath and wheezing.   Cardiovascular:  Negative for chest pain and palpitations.  Gastrointestinal:  Positive for abdominal pain. Negative for abdominal distention, constipation, diarrhea, nausea and vomiting.  Genitourinary:  Positive for vaginal discharge. Negative for dysuria, flank pain, frequency, vaginal bleeding and vaginal pain.  Musculoskeletal:  Negative for back pain and neck pain.  Skin:  Negative for rash and wound.  Neurological:  Negative for light-headedness and headaches.  Psychiatric/Behavioral:  Negative for agitation.   All other systems reviewed and are negative.   Physical Exam Updated Vital Signs BP 113/71 (BP Location: Right Arm)   Pulse 83   Temp 97.9 F (36.6 C) (Oral)   Resp 18   Ht 5\' 5"  (1.651 m)   Wt 63.5 kg   SpO2 99%   BMI 23.30 kg/m  Physical Exam Vitals and nursing note reviewed.  Constitutional:      General: She is not in acute distress.    Appearance: She is well-developed. She is not ill-appearing, toxic-appearing or diaphoretic.  HENT:     Head: Normocephalic and atraumatic.     Nose: No congestion or rhinorrhea.     Mouth/Throat:     Mouth: Mucous membranes are moist.  Pharynx: No oropharyngeal exudate or posterior oropharyngeal erythema.  Eyes:     Extraocular Movements: Extraocular movements intact.     Conjunctiva/sclera: Conjunctivae normal.     Pupils: Pupils are equal, round, and reactive to light.  Cardiovascular:     Rate and Rhythm: Normal rate and regular rhythm.     Heart sounds: No murmur heard. Pulmonary:     Effort: Pulmonary effort is normal. No respiratory distress.     Breath sounds: Normal breath sounds. No wheezing, rhonchi or rales.  Chest:     Chest wall: No  tenderness.  Abdominal:     General: Abdomen is flat.     Palpations: Abdomen is soft.     Tenderness: There is abdominal tenderness. There is no right CVA tenderness, left CVA tenderness, guarding or rebound.  Genitourinary:    Comments: Pt in hallway and has no pelvic pain. Pt agrees to defer pelvic exam Musculoskeletal:        General: No swelling or tenderness.     Cervical back: Neck supple. No tenderness.     Right lower leg: No edema.     Left lower leg: No edema.  Skin:    General: Skin is warm and dry.     Capillary Refill: Capillary refill takes less than 2 seconds.     Findings: No erythema or rash.  Neurological:     General: No focal deficit present.     Mental Status: She is alert.  Psychiatric:        Mood and Affect: Mood normal.     ED Results / Procedures / Treatments   Labs (all labs ordered are listed, but only abnormal results are displayed) Labs Reviewed  WET PREP, GENITAL - Abnormal; Notable for the following components:      Result Value   Clue Cells Wet Prep HPF POC PRESENT (*)    All other components within normal limits  COMPREHENSIVE METABOLIC PANEL - Abnormal; Notable for the following components:   AST 14 (*)    All other components within normal limits  URINALYSIS, ROUTINE W REFLEX MICROSCOPIC - Abnormal; Notable for the following components:   APPearance HAZY (*)    Hgb urine dipstick MODERATE (*)    Bacteria, UA RARE (*)    All other components within normal limits  CBC WITH DIFFERENTIAL/PLATELET  LIPASE, BLOOD  PREGNANCY, URINE  I-STAT BETA HCG BLOOD, ED ([facility], [facility], [facility] ONLY)  GC/CHLAMYDIA PROBE AMP ([place]) NOT AT [facility]    EKG None  Radiology US Abdomen Limited RUQ (LIVER/GB)  Result Date: 01/29/2022 CLINICAL DATA:  Abdominal pain for 2 days. No nausea or vomiting. EXAM: ULTRASOUND ABDOMEN LIMITED RIGHT UPPER QUADRANT COMPARISON:  CT abdomen and pelvis 01/01/2021 FINDINGS: Gallbladder: No gallstones or wall thickening visualized.  No sonographic Murphy sign noted by sonographer. Common bile duct: Diameter: 2 mm, within normal limits Liver: No focal lesion identified. Within normal limits in parenchymal echogenicity. Portal vein is patent on color Doppler imaging with normal direction of blood flow towards the liver. Other: None. IMPRESSION: Normal right upper quadrant abdominal ultrasound. Electronically Signed   By: Neita Garnet M.D.   On: 01/29/2022 11:14    Procedures Procedures    Medications Ordered in ED Medications  alum & mag hydroxide-simeth (MAALOX/MYLANTA) 200-200-20 MG/5ML suspension 30 mL (30 mLs Oral Given 01/29/22 1117)    And  lidocaine (XYLOCAINE) 2 % viscous mouth solution 15 mL (15 mLs Oral Given 01/29/22 1117)    ED Course/ Medical  Decision Making/ A&P                           Medical Decision Making Amount and/or Complexity of Data Reviewed Labs: ordered. Radiology: ordered.  Risk OTC drugs. Prescription drug management.    Erica Clements is a 31 y.o. female with a past medical history significant for previous stomach ulcer, previous STI, and recurrent BV who presents with epigastric abdominal pain and vaginal discharge.  Patient reports that for the last 2 days, she has had upper abdominal discomfort that is worse with palpation.  She reports that it does not feel like when she had ulcers in the past and it is more of a cramping and soreness.  She reports it does not radiate but is in the epigastric area primarily.  No chest pain or shortness of breath.  No nausea or vomiting.  No constipation or diarrhea.  She denies any dysuria or hematuria but does report that she has had some vaginal discharge for the last 2 days that feels similar to previous BV.  She reports no new sexual partners and denies any pelvic pain whatsoever.  Patient wants to be checked make sure she does not have BV again.  She denies any back pain, flank pain, or any traumatic injuries.  No other complaints reported.  She  reports the pain is moderate.  On exam, lungs clear and chest nontender.  Abdomen is only tender in epigastric area.  No tenderness in the lower abdomen and normal bowel sounds.  Exam otherwise unremarkable.  As patient is in a hall bed and has no pelvic pain, patient agrees to hold on physical pelvic exam but will do a self swab to get a gonorrhea/committee test and a wet prep.  Given her lack of pelvic pain and her minimal concern for STI, patient agrees to hold on empiric treatment with antibiotics for STI but agrees to check for BV initially.  Due to the discomfort in the epigastric area, we will get screening labs and we will give her a GI cocktail with her history of some stomach ulcers however I will get a right upper quadrant ultrasound to rule out a gallbladder cause of symptoms.  She will get some screening labs as well.  Anticipate reassessment after work-up to determine disposition  Patient's labs were reassuring aside from BV being found on the wet prep.  We will treat with antibiotics for recurrent BV.  Her upper abdominal ultrasound showed no evidence of acute cholecystitis or other abnormality.  Suspect recurrent reflux/gastritis cause of symptoms.  The GI cocktail completely resolved her symptoms.  We will give her return for Prilosec in the Flagyl and she will follow-up with the PCP.  She agreed with plan of care and was discharged in good condition with resolved symptoms of abdominal pain.        Final Clinical Impression(s) / ED Diagnoses Final diagnoses:  BV (bacterial vaginosis)  Upper abdominal pain    Rx / DC Orders ED Discharge Orders          Ordered    omeprazole (PRILOSEC) 20 MG capsule  Daily        01/29/22 1319    metroNIDAZOLE (FLAGYL) 500 MG tablet  2 times daily        01/29/22 1319            Clinical Impression: 1. BV (bacterial vaginosis)   2. Upper abdominal pain  Disposition: Discharge  Condition: Good  I have discussed the  results, Dx and Tx plan with the pt(& family if present). He/she/they expressed understanding and agree(s) with the plan. Discharge instructions discussed at great length. Strict return precautions discussed and pt &/or family have verbalized understanding of the instructions. No further questions at time of discharge.    New Prescriptions   METRONIDAZOLE (FLAGYL) 500 MG TABLET    Take 1 tablet (500 mg total) by mouth 2 (two) times daily for 7 days.   OMEPRAZOLE (PRILOSEC) 20 MG CAPSULE    Take 1 capsule (20 mg total) by mouth daily.    Follow Up: [facility] [address] [place] [address] [place] [address] [phone] Schedule an appointment as soon as possible for a visit    [facility]-EMERGENCY DEPT [address] [id number] mc [place] [address] [phone]        Tamu Golz, Canary Brim, MD 01/29/22 1321

## 2022-02-01 LAB — GC/CHLAMYDIA PROBE AMP (~~LOC~~) NOT AT ARMC
Chlamydia: POSITIVE — AB
Comment: NEGATIVE
Comment: NORMAL
Neisseria Gonorrhea: POSITIVE — AB

## 2022-03-15 ENCOUNTER — Other Ambulatory Visit: Payer: Self-pay

## 2022-03-15 DIAGNOSIS — W108XXA Fall (on) (from) other stairs and steps, initial encounter: Secondary | ICD-10-CM | POA: Insufficient documentation

## 2022-03-15 DIAGNOSIS — S2242XA Multiple fractures of ribs, left side, initial encounter for closed fracture: Secondary | ICD-10-CM | POA: Insufficient documentation

## 2022-03-15 DIAGNOSIS — F17210 Nicotine dependence, cigarettes, uncomplicated: Secondary | ICD-10-CM | POA: Diagnosis not present

## 2022-03-15 DIAGNOSIS — S20302A Unspecified superficial injuries of left front wall of thorax, initial encounter: Secondary | ICD-10-CM | POA: Diagnosis present

## 2022-03-16 ENCOUNTER — Encounter (HOSPITAL_BASED_OUTPATIENT_CLINIC_OR_DEPARTMENT_OTHER): Payer: Self-pay | Admitting: Emergency Medicine

## 2022-03-16 ENCOUNTER — Emergency Department (HOSPITAL_BASED_OUTPATIENT_CLINIC_OR_DEPARTMENT_OTHER): Payer: Medicaid Other

## 2022-03-16 ENCOUNTER — Emergency Department (HOSPITAL_BASED_OUTPATIENT_CLINIC_OR_DEPARTMENT_OTHER)
Admission: EM | Admit: 2022-03-16 | Discharge: 2022-03-16 | Disposition: A | Discharge status: Home / Self Care | Payer: Medicaid Other | Attending: Emergency Medicine | Admitting: Emergency Medicine

## 2022-03-16 DIAGNOSIS — S2242XA Multiple fractures of ribs, left side, initial encounter for closed fracture: Secondary | ICD-10-CM

## 2022-03-16 DIAGNOSIS — W108XXA Fall (on) (from) other stairs and steps, initial encounter: Secondary | ICD-10-CM

## 2022-03-16 MED ORDER — OXYCODONE-ACETAMINOPHEN 5-325 MG PO TABS
1.0000 | ORAL_TABLET | ORAL | Status: DC | PRN
  Administered 2022-03-16: 1 via ORAL
  Filled 2022-03-16: qty 1

## 2022-03-16 MED ORDER — HYDROCODONE-ACETAMINOPHEN 5-325 MG PO TABS: 1.0000 | ORAL_TABLET | ORAL | 0 refills | Status: DC | PRN

## 2022-03-16 MED ORDER — NAPROXEN 375 MG PO TABS: ORAL_TABLET | ORAL | 0 refills | Status: DC

## 2022-03-16 NOTE — ED Notes (Signed)
Pt is fully aware she can not drive.  States she will call an [facility]oad to take her home.  Security made aware at time of DC

## 2022-03-16 NOTE — Patient Instructions (Signed)
Unable to educate patient on the proper use of an IS. Patient in pain not able to sit up. Notified RN of patients condition.

## 2022-03-16 NOTE — ED Provider Notes (Signed)
[facility] Provider Note: Lowella Dell, MD, FACEP  CSN: [id number] MRN: [id number] ARRIVAL: 03/15/22 at 2357 ROOM: MH03/MH03   CHIEF COMPLAINT  Rib Injury   HISTORY OF PRESENT ILLNESS  03/16/22 1:55 AM Erica Clements is a 31 y.o. female who fell down the stairs yesterday evening injuring her left ribs.  She is having pain in her ribs just lateral to her left breast.  She rates the pain as a 7 out of 10, worse with breathing or palpation.  She denies neck pain or head injury.  She is not on anticoagulation.    Past Medical History:  Diagnosis Date   Medical history non-contributory    Stomach ulcer     Past Surgical History:  Procedure Laterality Date   FEMUR IM NAIL Left 09/12/2016   Procedure: INTRAMEDULLARY (IM) NAIL FEMORAL;  Surgeon: Nadara Mustard, MD;  Location: [facility] OR;  Service: Orthopedics;  Laterality: Left;    Family History  Problem Relation Age of Onset   Cancer Paternal Grandmother    Diabetes Paternal Grandmother     Social History   Tobacco Use   Smoking status: Some Days    Types: Cigarettes   Smokeless tobacco: Never  Vaping Use   Vaping Use: Never used  Substance Use Topics   Alcohol use: Yes    Comment: Socially   Drug use: No    Prior to Admission medications   Medication Sig Start Date End Date Taking? Authorizing Provider  HYDROcodone-acetaminophen (NORCO) 5-325 MG tablet Take 1 tablet by mouth every 4 (four) hours as needed for severe pain. 03/16/22  Yes Reshunda Strider, MD  naproxen (NAPROSYN) 375 MG tablet Take 1 tablet twice daily as needed for rib pain. 03/16/22  Yes Jayde Mcallister, MD  Doxylamine-Pyridoxine (DICLEGIS) 10-10 MG TBEC Take 10 mg by mouth 2 (two) times daily. 08/13/21   Achille Rich, PA-C  famotidine (PEPCID) 40 MG tablet Take 1 tablet (40 mg total) by mouth daily. 12/31/20   Wieters, Hallie C, PA-C  omeprazole (PRILOSEC) 20 MG capsule Take 1 capsule (20 mg total) by mouth daily. 01/29/22   Tegeler, Canary Brim, MD   ondansetron (ZOFRAN ODT) 4 MG disintegrating tablet Take 1 tablet (4 mg total) by mouth every 8 (eight) hours as needed for nausea or vomiting. 12/31/20   Wieters, Hallie C, PA-C  sucralfate (CARAFATE) 1 g tablet Take 1 tablet (1 g total) by mouth 4 (four) times daily -  with meals and at bedtime. 12/31/20   Wieters, Hallie C, PA-C    Allergies Patient has no known allergies.   REVIEW OF SYSTEMS  Negative except as noted here or in the History of Present Illness.   PHYSICAL EXAMINATION  Initial Vital Signs Blood pressure 125/69, pulse (!) 107, temperature 98.3 F (36.8 C), temperature source Oral, resp. rate 16, height 5\' 5"  (1.651 m), weight 68 kg, SpO2 100 %.  Examination General: Well-developed, well-nourished female in no acute distress; appearance consistent with age of record HENT: normocephalic; atraumatic Eyes: Normal appearance Neck: supple Heart: regular rate and rhythm Lungs: clear to auscultation bilaterally Chest: Left lateral rib tenderness Abdomen: soft; nondistended; nontender; bowel sounds present Extremities: No deformity; full range of motion; pulses normal Neurologic: Awake, alert and oriented; motor function intact in all extremities and symmetric; no facial droop Skin: Warm and dry Psychiatric: Normal mood and affect   RESULTS  Summary of this visit's results, reviewed and interpreted by myself:   EKG Interpretation  Date/Time:  Ventricular Rate:    PR Interval:    QRS Duration:   QT Interval:    QTC Calculation:   R Axis:     Text Interpretation:         Laboratory Studies: No results found for this or any previous visit (from the past 24 hour(s)). Imaging Studies: DG Ribs Unilateral W/Chest Left  Result Date: 03/16/2022 CLINICAL DATA:  Left rib injury EXAM: LEFT RIBS AND CHEST - 3+ VIEW COMPARISON:  09/12/2016 FINDINGS: Healing left posterior 8th and 9th rib fractures. Acute appearing fractures noted through the anterior left 7th, 8th,  and 9th ribs. No effusion or pneumothorax. Lungs clear. Heart is normal size. IMPRESSION: Acute appearing left anterior 7th through 9th rib fractures. Healing posterior left 8th and 9th rib fractures. Electronically Signed   By: Charlett Nose M.D.   On: 03/16/2022 00:42    ED COURSE and MDM  Nursing notes, initial and subsequent vitals signs, including pulse oximetry, reviewed and interpreted by myself.  Vitals:   03/16/22 0002 03/16/22 0004  BP: 125/69   Pulse: (!) 107   Resp: 16   Temp: 98.3 F (36.8 C)   TempSrc: Oral   SpO2: 100%   Weight:  68 kg  Height:  5\' 5"  (1.651 m)   Medications  oxyCODONE-acetaminophen (PERCOCET/ROXICET) 5-325 MG per tablet 1 tablet (1 tablet Oral Given 03/16/22 0138)   Radiograph confirms clinical suspicion of left-sided rib fractures.  We will provide an incentive spirometer and treat her pain.  No pneumothorax seen on radiograph.   PROCEDURES  Procedures   ED DIAGNOSES     ICD-10-CM   1. Closed fracture of multiple ribs of left side, initial encounter  S22.42XA     2. Fall down stairs, initial encounter  W10.8XXA          Robertine Kipper, 05/16/22, MD 03/16/22 [phone]

## 2022-03-16 NOTE — ED Triage Notes (Signed)
C/o left sided rib pain after falling down stairs tonight. Denies neck pain, hitting head, blood thinners, loc.

## 2022-03-16 NOTE — ED Notes (Signed)
Pt c/o increased pain, left side rib area.  Xray + for rib fxs Initiated standing orders for pain Pt has called for ride at time of DC Pain 7/10

## 2022-03-17 ENCOUNTER — Telehealth: Payer: Self-pay

## 2022-03-17 NOTE — Telephone Encounter (Signed)
Transition Care Management Follow-up Telephone Call Date of discharge and from where: 03/16/2022 from [facility] How have you been since you were released from the hospital? Patient stated that she is in pain and that is to be expected. Patient has picked up rx from pharmacy and it does help with the pain some. Patient stated that she did not have any questions or concerns at this time. Patient did not want to establish with a PCP but will call back if/when she does.  Any questions or concerns? No  Items Reviewed: Did the pt receive and understand the discharge instructions provided? Yes  Medications obtained and verified? Yes  Other? No  Any new allergies since your discharge? No  Dietary orders reviewed? No Do you have support at home? Yes   Functional Questionnaire: (I = Independent and D = Dependent) ADLs: I  Bathing/Dressing- I  Meal Prep- I  Eating- I  Maintaining continence- I  Transferring/Ambulation- I  Managing Meds- I   Follow up appointments reviewed:  PCP Hospital f/u appt confirmed? No   Specialist Hospital f/u appt confirmed? No   Are transportation arrangements needed? No  If their condition worsens, is the pt aware to call PCP or go to the Emergency Dept.? Yes Was the patient provided with contact information for the PCP's office or ED? Yes Was to pt encouraged to call back with questions or concerns? Yes

## 2022-03-22 ENCOUNTER — Ambulatory Visit (INDEPENDENT_AMBULATORY_CARE_PROVIDER_SITE_OTHER): Payer: Medicaid Other | Admitting: General Practice

## 2022-03-22 VITALS — BP 100/60 | HR 84 | Ht 65.0 in | Wt 137.4 lb

## 2022-03-22 DIAGNOSIS — Z3201 Encounter for pregnancy test, result positive: Secondary | ICD-10-CM | POA: Diagnosis not present

## 2022-03-22 DIAGNOSIS — Z3200 Encounter for pregnancy test, result unknown: Secondary | ICD-10-CM

## 2022-03-22 LAB — POCT URINE PREGNANCY: Preg Test, Ur: POSITIVE — AB

## 2022-03-22 NOTE — Progress Notes (Signed)
Erica Clements presents today for UPT. She has no unusual complaints. LMP: 02-12-22     OBJECTIVE: Appears well, in no apparent distress.  OB History     Gravida  4   Para  3   Term  3   Preterm  0   AB  1   Living  3      SAB  1   IAB  0   Ectopic  0   Multiple  0   Live Births  3          Home UPT Result: Positive x2 In-Office UPT result: positive  I have reviewed the patient's medical, obstetrical, social, and family histories, and medications.   ASSESSMENT: Positive pregnancy test  PLAN Prenatal care to be completed at: [facility]

## 2022-04-20 ENCOUNTER — Encounter (HOSPITAL_BASED_OUTPATIENT_CLINIC_OR_DEPARTMENT_OTHER): Payer: Self-pay | Admitting: Urology

## 2022-04-20 ENCOUNTER — Emergency Department (HOSPITAL_BASED_OUTPATIENT_CLINIC_OR_DEPARTMENT_OTHER)
Admission: EM | Admit: 2022-04-20 | Discharge: 2022-04-20 | Disposition: A | Discharge status: Home / Self Care | Payer: Medicaid Other | Attending: Emergency Medicine | Admitting: Emergency Medicine

## 2022-04-20 ENCOUNTER — Other Ambulatory Visit: Payer: Self-pay

## 2022-04-20 DIAGNOSIS — Z202 Contact with and (suspected) exposure to infections with a predominantly sexual mode of transmission: Secondary | ICD-10-CM | POA: Insufficient documentation

## 2022-04-20 DIAGNOSIS — O98311 Other infections with a predominantly sexual mode of transmission complicating pregnancy, first trimester: Secondary | ICD-10-CM | POA: Diagnosis present

## 2022-04-20 DIAGNOSIS — Z3A01 Less than 8 weeks gestation of pregnancy: Secondary | ICD-10-CM

## 2022-04-20 DIAGNOSIS — A64 Unspecified sexually transmitted disease: Secondary | ICD-10-CM

## 2022-04-20 LAB — URINALYSIS, ROUTINE W REFLEX MICROSCOPIC
Bilirubin Urine: NEGATIVE
Glucose, UA: NEGATIVE mg/dL
Ketones, ur: NEGATIVE mg/dL
Leukocytes,Ua: NEGATIVE
Nitrite: NEGATIVE
Protein, ur: NEGATIVE mg/dL
Specific Gravity, Urine: 1.01 (ref 1.005–1.030)
pH: 5.5 (ref 5.0–8.0)

## 2022-04-20 LAB — WET PREP, GENITAL
Sperm: NONE SEEN
Trich, Wet Prep: NONE SEEN
WBC, Wet Prep HPF POC: <10 (ref <10)
Yeast Wet Prep HPF POC: NONE SEEN

## 2022-04-20 LAB — URINALYSIS, MICROSCOPIC (REFLEX)

## 2022-04-20 LAB — PREGNANCY, URINE: Preg Test, Ur: POSITIVE — AB

## 2022-04-20 MED ORDER — CEFTRIAXONE SODIUM 500 MG IJ SOLR
500.0000 mg | Freq: Once | INTRAMUSCULAR | Status: AC
  Administered 2022-04-20: 500 mg via INTRAMUSCULAR
  Filled 2022-04-20: qty 500

## 2022-04-20 MED ORDER — METRONIDAZOLE 1 % EX GEL: Freq: Every day | CUTANEOUS | 0 refills | Status: DC

## 2022-04-20 MED ORDER — AZITHROMYCIN 1 G PO PACK
1.0000 g | PACK | Freq: Once | ORAL | Status: AC
  Administered 2022-04-20: 1 g via ORAL
  Filled 2022-04-20: qty 1

## 2022-04-20 NOTE — ED Provider Notes (Signed)
[place] EMERGENCY DEPARTMENT Provider Note   CSN: [id number] Arrival date & time: 04/20/22  0046     History  Chief Complaint  Patient presents with   SEXUALLY TRANSMITTED DISEASE    Erica Clements is a 31 y.o. female.  The history is provided by the patient.  Exposure to STD This is a recurrent problem. The current episode started more than 2 days ago. The problem occurs constantly. The problem has not changed since onset.Pertinent negatives include no chest pain, no abdominal pain, no headaches and no shortness of breath. Associated symptoms comments: Discharge and itchy, is [redacted] weeks pregnant.  Unknown if partner was treated after last positive test in July. . Nothing aggravates the symptoms. Nothing relieves the symptoms. She has tried nothing for the symptoms. The treatment provided no relief.       Home Medications Prior to Admission medications   Medication Sig Start Date End Date Taking? Authorizing Provider  metroNIDAZOLE (METROGEL) 1 % gel Apply topically daily. 04/20/22  Yes Chesni Vos, MD  Doxylamine-Pyridoxine (DICLEGIS) 10-10 MG TBEC Take 10 mg by mouth 2 (two) times daily. Patient not taking: Reported on 03/22/2022 08/13/21   Sherrell Puller, PA-C  famotidine (PEPCID) 40 MG tablet Take 1 tablet (40 mg total) by mouth daily. Patient not taking: Reported on 03/22/2022 12/31/20   Wieters, Elesa Hacker, PA-C  HYDROcodone-acetaminophen (NORCO) 5-325 MG tablet Take 1 tablet by mouth every 4 (four) hours as needed for severe pain. Patient not taking: Reported on 03/22/2022 03/16/22   Molpus, John, MD  naproxen (NAPROSYN) 375 MG tablet Take 1 tablet twice daily as needed for rib pain. 03/16/22   Molpus, John, MD  omeprazole (PRILOSEC) 20 MG capsule Take 1 capsule (20 mg total) by mouth daily. Patient not taking: Reported on 03/22/2022 01/29/22   Tegeler, Gwenyth Allegra, MD  ondansetron (ZOFRAN ODT) 4 MG disintegrating tablet Take 1 tablet (4 mg total) by mouth every 8  (eight) hours as needed for nausea or vomiting. Patient not taking: Reported on 03/22/2022 12/31/20   Wieters, Hallie C, PA-C  sucralfate (CARAFATE) 1 g tablet Take 1 tablet (1 g total) by mouth 4 (four) times daily -  with meals and at bedtime. Patient not taking: Reported on 03/22/2022 12/31/20   Debara Pickett C, PA-C      Allergies    Patient has no known allergies.    Review of Systems   Review of Systems  Respiratory:  Negative for shortness of breath.   Cardiovascular:  Negative for chest pain.  Gastrointestinal:  Negative for abdominal pain.  Genitourinary:  Positive for vaginal discharge. Negative for vaginal bleeding.  Neurological:  Negative for headaches.  All other systems reviewed and are negative.   Physical Exam Updated Vital Signs BP 117/67 (BP Location: Left Arm)   Pulse 94   Temp 98.7 F (37.1 C) (Oral)   Resp 16   Ht 5\' 5"  (1.651 m)   Wt 68 kg   LMP 02/12/2022   SpO2 98%   BMI 24.96 kg/m  Physical Exam Vitals and nursing note reviewed.  Constitutional:      General: She is not in acute distress.    Appearance: She is well-developed.  HENT:     Head: Normocephalic and atraumatic.     Nose: Nose normal.  Eyes:     Pupils: Pupils are equal, round, and reactive to light.  Cardiovascular:     Rate and Rhythm: Normal rate and regular rhythm.     Pulses:  Normal pulses.     Heart sounds: Normal heart sounds.  Pulmonary:     Effort: Pulmonary effort is normal. No respiratory distress.     Breath sounds: Normal breath sounds.  Abdominal:     General: Bowel sounds are normal. There is no distension.     Palpations: Abdomen is soft.     Tenderness: There is no abdominal tenderness. There is no guarding or rebound.  Genitourinary:    Vagina: No vaginal discharge.  Musculoskeletal:        General: Normal range of motion.     Cervical back: Neck supple.  Skin:    General: Skin is dry.     Capillary Refill: Capillary refill takes less than 2 seconds.      Findings: No erythema or rash.  Neurological:     General: No focal deficit present.     Mental Status: She is oriented to person, place, and time.     Deep Tendon Reflexes: Reflexes normal.  Psychiatric:        Mood and Affect: Mood normal.     ED Results / Procedures / Treatments   Labs (all labs ordered are listed, but only abnormal results are displayed) Labs Reviewed  WET PREP, GENITAL - Abnormal; Notable for the following components:      Result Value   Clue Cells Wet Prep HPF POC PRESENT (*)    All other components within normal limits  PREGNANCY, URINE - Abnormal; Notable for the following components:   Preg Test, Ur POSITIVE (*)    All other components within normal limits  URINALYSIS, ROUTINE W REFLEX MICROSCOPIC - Abnormal; Notable for the following components:   Hgb urine dipstick SMALL (*)    All other components within normal limits  URINALYSIS, MICROSCOPIC (REFLEX) - Abnormal; Notable for the following components:   Bacteria, UA FEW (*)    Non Squamous Epithelial PRESENT (*)    All other components within normal limits  GC/CHLAMYDIA PROBE AMP ([place]) NOT AT [facility]    EKG None  Radiology No results found.  Procedures Procedures    Medications Ordered in ED Medications  cefTRIAXone (ROCEPHIN) injection 500 mg (500 mg Intramuscular Given 04/20/22 0329)  azithromycin (ZITHROMAX) powder 1 g (1 g Oral Given 04/20/22 0327)    ED Course/ Medical Decision Making/ A&P                           Medical Decision Making Patient having discharge and itchy,  believe she has an STI  Amount and/or Complexity of Data Reviewed External Data Reviewed: labs and notes.    Details: Previous notes and GC testing reviewed  Labs: ordered.    Details: Wet prep: clue cells.  Pregnancy is positive.  No UTI on urine   Risk Prescription drug management. Risk Details: Treated in the ED based on OB guidelines.  No sexual activity until 7 days after all partners  confirmed treated.  Call you OB for repeat testing in 3 weeks.      Final Clinical Impression(s) / ED Diagnoses Final diagnoses:  Less than [redacted] weeks gestation of pregnancy  STI (sexually transmitted infection)   Return for intractable cough, coughing up blood, fevers > 100.4 unrelieved by medication, shortness of breath, intractable vomiting, chest pain, shortness of breath, weakness, numbness, changes in speech, facial asymmetry, abdominal pain, passing out, Inability to tolerate liquids or food, cough, altered mental status or any concerns. No signs of systemic  illness or infection. The patient is nontoxic-appearing on exam and vital signs are within normal limits.  I have reviewed the triage vital signs and the nursing notes. Pertinent labs & imaging results that were available during my care of the patient were reviewed by me and considered in my medical decision making (see chart for details). After history, exam, and medical workup I feel the patient has been appropriately medically screened and is safe for discharge home. Pertinent diagnoses were discussed with the patient. Patient was given return precautions.  Rx / DC Orders ED Discharge Orders          Ordered    metroNIDAZOLE (METROGEL) 1 % gel  Daily        04/20/22 0401              Abrielle Finck, MD 04/20/22 2919

## 2022-04-20 NOTE — Discharge Instructions (Signed)
No

## 2022-04-20 NOTE — ED Triage Notes (Signed)
Pt states was seen 2 weeks ago and tested Positive for Ghonnorhea, states came today for her shot States concern for BV due to white vaginal discharge.  Denies pain  Pregnant EDD 11/26/2022

## 2022-04-21 LAB — GC/CHLAMYDIA PROBE AMP (~~LOC~~) NOT AT ARMC
Chlamydia: POSITIVE — AB
Comment: NEGATIVE
Comment: NORMAL
Neisseria Gonorrhea: NEGATIVE

## 2022-04-22 ENCOUNTER — Ambulatory Visit (INDEPENDENT_AMBULATORY_CARE_PROVIDER_SITE_OTHER): Payer: Medicaid Other

## 2022-04-22 ENCOUNTER — Ambulatory Visit: Payer: Medicaid Other | Admitting: *Deleted

## 2022-04-22 VITALS — BP 113/70 | HR 73 | Ht 65.0 in | Wt 139.0 lb

## 2022-04-22 DIAGNOSIS — Z3480 Encounter for supervision of other normal pregnancy, unspecified trimester: Secondary | ICD-10-CM

## 2022-04-22 DIAGNOSIS — O3680X0 Pregnancy with inconclusive fetal viability, not applicable or unspecified: Secondary | ICD-10-CM | POA: Diagnosis not present

## 2022-04-22 MED ORDER — BLOOD PRESSURE KIT DEVI: 1.0000 | 0 refills | Status: AC

## 2022-04-22 MED ORDER — PRENATAL 28-0.8 MG PO TABS: 1.0000 | ORAL_TABLET | Freq: Every day | ORAL | 12 refills | Status: AC

## 2022-04-22 MED ORDER — PROMETHAZINE HCL 25 MG PO TABS: 25.0000 mg | ORAL_TABLET | Freq: Four times a day (QID) | ORAL | 1 refills | Status: DC | PRN

## 2022-04-22 NOTE — Progress Notes (Signed)
I have reviewed the chart and agree with the nurse's note and plan of care for this visit.   Fatima Blank, CNM 2:21 PM

## 2022-04-22 NOTE — Progress Notes (Signed)
New OB Intake  I connected with  Randell Loop on 04/22/22 at 10:15 AM EDT by In Person Visit and verified that I am speaking with the correct person using two identifiers. Nurse is located at [facility] and pt is located at [place].  I discussed the limitations, risks, security and privacy concerns of performing an evaluation and management service by telephone and the availability of in person appointments. I also discussed with the patient that there may be a patient responsible charge related to this service. The patient expressed understanding and agreed to proceed.  I explained I am completing New OB Intake today. We discussed her EDD of 11/11/22 that is based on [place] today at [redacted]w[redacted]d. Pt is G5/P3. I reviewed her allergies, medications, Medical/Surgical/OB history, and appropriate screenings. I informed her of [facility] services. [facility] information placed in AVS. Based on history, this is a/an  pregnancy complicated by infection with chlamydia  .   Patient Active Problem List   Diagnosis Date Noted   Encounter for induction of labor 09/23/2018   Pregnancy with adoption planned, third trimester 09/05/2018   History of cryosurgery 08/30/2018   Supervision of high risk pregnancy, antepartum 08/29/2018   Limited prenatal care in third trimester 06/27/2018   Closed displaced transverse fracture of shaft of left femur ([place]) 09/12/2016   Closed supracondylar fracture of left femur ([place])     Concerns addressed today  Delivery Plans Plans to deliver at [facility]. Patient given information for [facility] Healthy Baby website for more information about [facility] and [place]. Patient is not interested in water birth. Offered upcoming OB visit with CNM to discuss further.  MyChart/Babyscripts MyChart access verified. I explained pt will have some visits in office and some virtually. Babyscripts instructions given and order placed. Patient verifies receipt of registration text/e-mail. Account successfully created  and app downloaded.  Blood Pressure Cuff/Weight Scale Blood pressure cuff ordered for patient to pick-up from [employer]. Explained after first prenatal appt pt will check weekly and document in 63. Patient does / does not  have weight scale. Weight scale ordered for patient to pick up from [employer].   Anatomy US Explained first scheduled [place] will be around 19 weeks. Anatomy US scheduled for 19wks at M[facility]. Pt notified to arrive at TBD.  Labs Discussed Johnsie Cancel genetic screening with patient. Would like both [place] and Horizon drawn at new OB visit. Routine prenatal labs needed.  Covid Vaccine Patient has covid vaccine.   Social Determinants of Health Food Insecurity: Patient denies food insecurity. WIC Referral: Patient is interested in referral to [facility].  Transportation: Patient denies transportation needs. Childcare: Discussed no children allowed at ultrasound appointments. Offered childcare services; patient declines childcare services at this time.  First visit review I reviewed new OB appt with pt. I explained she will have a provider visit that includes labs and pelvic exam. Explained pt will be seen by Fatima Blank, CNM at first visit; encounter routed to appropriate provider. Explained that patient will be seen by pregnancy navigator following visit with provider.   Penny Pia, RN 04/22/2022  10:33 AM

## 2022-04-22 NOTE — Progress Notes (Signed)
Pt presents for OB Intake and [place] for viability and dating. Noted to have +chlamydia result from ED visit 04/20/22. Verified TX in ED with Azithromycin. TX for BV/ metrogel as well. Pregnancy sticky noted updated to include need for TOC.

## 2022-05-25 ENCOUNTER — Encounter: Payer: Medicaid Other | Admitting: Advanced Practice Midwife

## 2022-05-25 ENCOUNTER — Institutional Professional Consult (permissible substitution): Payer: Medicaid Other | Admitting: Licensed Clinical Social Worker

## 2022-05-25 NOTE — Progress Notes (Deleted)
Subjective:   Erica Clements is a 31 y.o. [id number] at 62w5dby {Ob dating:14516} being seen today for her first obstetrical visit.  Her obstetrical history is significant for {ob risk factors:10154} and has Closed displaced transverse fracture of shaft of left femur ([place]; Closed supracondylar fracture of left femur ([place]; Limited prenatal care in third trimester; Supervision of high risk pregnancy, antepartum; History of cryosurgery; Pregnancy with adoption planned, third trimester; Encounter for induction of labor; and Supervision of other normal pregnancy, antepartum on their problem list.. Patient {does/does not:19097} intend to breast feed. Pregnancy history fully reviewed.  Patient reports {sx:14538}.  HISTORY: OB History  Gravida Para Term Preterm AB Living  _0 0 1 3  SAB IAB Ectopic Multiple Live Births  1 0 0 0 3    # Outcome Date GA Lbr Len/2nd Weight Sex Delivery Anes PTL Lv  5 Current           4 Term 09/23/18 384w0d3:11 / 00:09 8 lb 0.9 oz (3.655 kg) M Vag-Spont EPI  LIV     Name: MCDOLLENE, MALLERY   Apgar1: 8  Apgar5: 9  3 SAB 2018          2 Term 11/25/15 3962w3d:07 / 00:10 9 lb 2.6 oz (4.156 kg) M Vag-Spont EPI  LIV     Name: MCCAVILYN, VIRTUE  Apgar1: 9  Apgar5: 9  1 Term 02/01/14 39w64w0d:30 / 00:36 9 lb (4.082 kg) M Vag-Spont EPI  LIV     Name: MCCOLoann Quill Apgar1: 9  Apgar5: 9   Past Medical History:  Diagnosis Date   Medical history non-contributory    Stomach ulcer    Past Surgical History:  Procedure Laterality Date   FEMUR IM NAIL Left 09/12/2016   Procedure: INTRAMEDULLARY (IM) NAIL FEMORAL;  Surgeon: MarcNewt Minion;  [facility];  Laterality: Left;   Family History  Problem Relation Age of Onset   Cancer Paternal Grandmother    Diabetes Paternal Grandmother    Diabetes Father    Social History   Tobacco Use   Smoking status: Former    Types: Cigarettes   Smokeless tobacco: Never  Vaping Use    Vaping Use: Former  Substance Use Topics   Alcohol use: Not Currently    Comment: Socially   Drug use: No   No Known Allergies Current Outpatient Medications on File Prior to Visit  Medication Sig Dispense Refill   Blood Pressure Monitoring (BLOOD PRESSURE KIT) DEVI 1 Device by Does not apply route once a week. 1 each 0   Doxylamine-Pyridoxine (DICLEGIS) 10-10 MG TBEC Take 10 mg by mouth 2 (two) times daily. 60 tablet 0   famotidine (PEPCID) 40 MG tablet Take 1 tablet (40 mg total) by mouth daily. 20 tablet 0   HYDROcodone-acetaminophen (NORCO) 5-325 MG tablet Take 1 tablet by mouth every 4 (four) hours as needed for severe pain. (Patient not taking: Reported on 03/22/2022) 30 tablet 0   metroNIDAZOLE (METROGEL) 1 % gel Apply topically daily. 45 g 0   naproxen (NAPROSYN) 375 MG tablet Take 1 tablet twice daily as needed for rib pain. 20 tablet 0   omeprazole (PRILOSEC) 20 MG capsule Take 1 capsule (20 mg total) by mouth daily. 30 capsule 0   ondansetron (ZOFRAN ODT) 4 MG disintegrating tablet Take 1 tablet (4 mg total) by mouth every 8 (eight) hours as needed  for nausea or vomiting. 20 tablet 0   Prenatal 28-0.8 MG TABS Take 1 tablet by mouth daily. 30 tablet 12   promethazine (PHENERGAN) 25 MG tablet Take 1 tablet (25 mg total) by mouth every 6 (six) hours as needed for nausea or vomiting. 30 tablet 1   sucralfate (CARAFATE) 1 g tablet Take 1 tablet (1 g total) by mouth 4 (four) times daily -  with meals and at bedtime. 30 tablet 0   No current facility-administered medications on file prior to visit.    *** Indications for ASA therapy (per uptodate) One of the following: Previous pregnancy with preeclampsia, especially early onset and with an adverse outcome {yes/no:20286} Multifetal gestation {yes/no:20286} Chronic hypertension {yes/no:20286} Type 1 or 2 diabetes mellitus {yes/no:20286} Chronic kidney disease {yes/no:20286} Autoimmune disease (antiphospholipid syndrome, systemic  lupus erythematosus) {yes/no:20286}  *** Two or more of the following: Nulliparity {yes/no:20286} Obesity (body mass index >30 kg/m2) {yes/no:20286} Family history of preeclampsia in mother or sister {yes/no:20286} Age ?35 years {yes/no:20286} Sociodemographic characteristics (African American race, low socioeconomic level) {yes/no:20286} Personal risk factors (eg, previous pregnancy with low birth weight or small for gestational age infant, previous adverse pregnancy outcome [eg, stillbirth], interval >10 years between pregnancies) {yes/no:20286}  *** Indications for early 1 hour GTT (per uptodate)  BMI >25 (>23 in Asian women) AND one of the following  Gestational diabetes mellitus in a previous pregnancy {yes/no:20286} Glycated hemoglobin ?5.7 percent (39 mmol/mol), impaired glucose tolerance, or impaired fasting glucose on previous testing {yes/no:20286} First-degree relative with diabetes {yes/no:20286} High-risk race/ethnicity (eg, African American, Latino, Native American, [place] American, [place] Islander) {yes/no:20286} History of cardiovascular disease {yes/no:20286} Hypertension or on therapy for hypertension {yes/no:20286} High-density lipoprotein cholesterol level <35 mg/dL (0.90 mmol/L) and/or a triglyceride level >250 mg/dL (2.82 mmol/L) {yes/no:20286} Polycystic ovary syndrome {yes/no:20286} Physical inactivity {yes/no:20286} Other clinical condition associated with insulin resistance (eg, severe obesity, acanthosis nigricans) {yes/no:20286} Previous birth of an infant weighing ?4000 g {yes/no:20286} Previous stillbirth of unknown cause {yes/no:20286} Exam   There were no vitals filed for this visit.    Uterus:     Pelvic Exam: Perineum: no hemorrhoids, normal perineum   Vulva: normal external genitalia, no lesions   Vagina:  normal mucosa, normal discharge   Cervix: no lesions and normal, pap smear done.    Adnexa: normal adnexa and no mass, fullness, tenderness    Bony Pelvis: average  System: General: well-developed, well-nourished female in no acute distress   Breast:  normal appearance, no masses or tenderness   Skin: normal coloration and turgor, no rashes   Neurologic: oriented, normal, negative, normal mood   Extremities: normal strength, tone, and muscle mass, ROM of all joints is normal   HEENT PERRLA, extraocular movement intact and sclera clear, anicteric   Mouth/Teeth mucous membranes moist, pharynx normal without lesions and dental hygiene good   Neck supple and no masses   Cardiovascular: regular rate and rhythm   Respiratory:  no respiratory distress, normal breath sounds   Abdomen: soft, non-tender; bowel sounds normal; no masses,  no organomegaly     Assessment:   Pregnancy: [id number] Patient Active Problem List   Diagnosis Date Noted   Supervision of other normal pregnancy, antepartum 04/22/2022   Encounter for induction of labor 09/23/2018   Pregnancy with adoption planned, third trimester 09/05/2018   History of cryosurgery 08/30/2018   Supervision of high risk pregnancy, antepartum 08/29/2018   Limited prenatal care in third trimester 06/27/2018   Closed displaced transverse fracture of shaft of  left femur ([place]) 09/12/2016   Closed supracondylar fracture of left femur ([place])      Plan:  There are no diagnoses linked to this encounter.   Initial labs drawn. Continue prenatal vitamins. Discussed and offered genetic screening options, including Quad screen/AFP, NIPS testing, and option to decline testing. Benefits/risks/alternatives reviewed. Pt aware that anatomy US is form of genetic screening with lower accuracy in detecting trisomies than blood work.  Pt chooses/declines genetic screening today. {Blank multiple:19196::"First trimester screen","Quad screen","NIPS"}: {requests/ordered/declines:14581}. Ultrasound discussed; fetal anatomic survey: {requests/ordered/declines:14581}. Problem list reviewed and updated. The  nature of [place] with multiple MDs and other Advanced Practice Providers was explained to patient; also emphasized that residents, students are part of our team. Routine obstetric precautions reviewed. No follow-ups on file.   Fatima Blank, CNM 05/25/22 9:53 AM

## 2022-06-01 ENCOUNTER — Other Ambulatory Visit: Payer: Self-pay | Admitting: Obstetrics and Gynecology

## 2022-06-01 ENCOUNTER — Other Ambulatory Visit (HOSPITAL_COMMUNITY)
Admission: RE | Admit: 2022-06-01 | Discharge: 2022-06-01 | Disposition: A | Discharge status: Home / Self Care | Payer: Medicaid Other | Source: Ambulatory Visit | Attending: Obstetrics and Gynecology | Admitting: Obstetrics and Gynecology

## 2022-06-01 ENCOUNTER — Ambulatory Visit (INDEPENDENT_AMBULATORY_CARE_PROVIDER_SITE_OTHER): Payer: Medicaid Other | Admitting: *Deleted

## 2022-06-01 VITALS — BP 102/65 | HR 94 | Wt 145.0 lb

## 2022-06-01 DIAGNOSIS — Z3480 Encounter for supervision of other normal pregnancy, unspecified trimester: Secondary | ICD-10-CM

## 2022-06-01 DIAGNOSIS — Z3482 Encounter for supervision of other normal pregnancy, second trimester: Secondary | ICD-10-CM | POA: Diagnosis not present

## 2022-06-01 DIAGNOSIS — Z31430 Encounter of female for testing for genetic disease carrier status for procreative management: Secondary | ICD-10-CM | POA: Diagnosis not present

## 2022-06-01 NOTE — Progress Notes (Signed)
SUBJECTIVE:  31 y.o. female who desires a STI screen. Denies abnormal vaginal discharge, bleeding or significant pelvic pain. No UTI symptoms. Here for 6 wk TOC chlamydia.  Patient's last menstrual period was 02/12/2022.  OBJECTIVE:  She appears well.   ASSESSMENT:  STI Screen   PLAN:  Pt offered STI blood screening-requested GC, chlamydia, and trichomonas probe sent to lab.  Treatment: To be determined once lab results are received.  Pt follow up as needed.  Pt is [redacted]w[redacted]d GA and New OB appt is not until 12/13. New OB labs + AFP, GC/CC, OB urine done today. Anatomy US already scheduled 12/15.

## 2022-06-01 NOTE — Progress Notes (Signed)
Agree with nurses's documentation of this patient's clinic encounter.  Rakiya Krawczyk L, MD  

## 2022-06-02 LAB — CERVICOVAGINAL ANCILLARY ONLY
Bacterial Vaginitis (gardnerella): POSITIVE — AB
Candida Glabrata: NEGATIVE
Candida Vaginitis: POSITIVE — AB
Chlamydia: NEGATIVE
Comment: NEGATIVE
Comment: NEGATIVE
Comment: NEGATIVE
Comment: NEGATIVE
Comment: NEGATIVE
Comment: NORMAL
Neisseria Gonorrhea: NEGATIVE
Trichomonas: NEGATIVE

## 2022-06-03 ENCOUNTER — Other Ambulatory Visit: Payer: Self-pay | Admitting: Emergency Medicine

## 2022-06-03 LAB — CBC/D/PLT+RPR+RH+ABO+RUBIGG...
Antibody Screen: NEGATIVE
Basophils Absolute: 0 10*3/uL (ref 0.0–0.2)
Basos: 0 %
EOS (ABSOLUTE): 0.1 10*3/uL (ref 0.0–0.4)
Eos: 1 %
HCV Ab: NONREACTIVE
HIV Screen 4th Generation wRfx: NONREACTIVE
Hematocrit: 32.8 % — ABNORMAL LOW (ref 34.0–46.6)
Hemoglobin: 11.6 g/dL (ref 11.1–15.9)
Hepatitis B Surface Ag: NEGATIVE
Immature Grans (Abs): 0 10*3/uL (ref 0.0–0.1)
Immature Granulocytes: 0 %
Lymphocytes Absolute: 1.3 10*3/uL (ref 0.7–3.1)
Lymphs: 18 %
MCH: 30.4 pg (ref 26.6–33.0)
MCHC: 35.4 g/dL (ref 31.5–35.7)
MCV: 86 fL (ref 79–97)
Monocytes Absolute: 0.5 10*3/uL (ref 0.1–0.9)
Monocytes: 6 %
Neutrophils Absolute: 5.4 10*3/uL (ref 1.4–7.0)
Neutrophils: 75 %
Platelets: 309 10*3/uL (ref 150–450)
RBC: 3.81 x10E6/uL (ref 3.77–5.28)
RDW: 11.6 % — ABNORMAL LOW (ref 11.7–15.4)
RPR Ser Ql: NONREACTIVE
Rh Factor: POSITIVE
Rubella Antibodies, IGG: 4.63 index (ref >0.99)
WBC: 7.4 10*3/uL (ref 3.4–10.8)

## 2022-06-03 LAB — AFP, SERUM, OPEN SPINA BIFIDA
AFP MoM: 0.98
AFP Value: 42 ng/mL
Gest. Age on Collection Date: 16.7 weeks
Maternal Age At EDD: 30.9 yr
OSBR Risk 1 IN: 10000
Test Results:: NEGATIVE
Weight: 140 [lb_av]

## 2022-06-03 LAB — URINE CULTURE, OB REFLEX

## 2022-06-03 LAB — HCV INTERPRETATION

## 2022-06-03 LAB — CULTURE, OB URINE

## 2022-06-03 MED ORDER — TERCONAZOLE 0.8 % VA CREA: 1.0000 | TOPICAL_CREAM | Freq: Every day | VAGINAL | 0 refills | Status: DC

## 2022-06-03 MED ORDER — METRONIDAZOLE 0.75 % VA GEL: 1.0000 | Freq: Every day | VAGINAL | 1 refills | Status: DC

## 2022-06-03 NOTE — Progress Notes (Signed)
Rx for yeast, BV per protocol.  

## 2022-06-07 LAB — PANORAMA PRENATAL TEST FULL PANEL:PANORAMA TEST PLUS 5 ADDITIONAL MICRODELETIONS: FETAL FRACTION: 11.5

## 2022-06-07 LAB — HORIZON CUSTOM: REPORT SUMMARY: NEGATIVE

## 2022-06-16 ENCOUNTER — Encounter: Payer: Self-pay | Admitting: Obstetrics

## 2022-06-16 ENCOUNTER — Other Ambulatory Visit (HOSPITAL_COMMUNITY)
Admission: RE | Admit: 2022-06-16 | Discharge: 2022-06-16 | Disposition: A | Discharge status: Home / Self Care | Payer: Medicaid Other | Source: Ambulatory Visit | Attending: Advanced Practice Midwife | Admitting: Advanced Practice Midwife

## 2022-06-16 ENCOUNTER — Ambulatory Visit (INDEPENDENT_AMBULATORY_CARE_PROVIDER_SITE_OTHER): Payer: Medicaid Other | Admitting: Obstetrics

## 2022-06-16 ENCOUNTER — Ambulatory Visit (INDEPENDENT_AMBULATORY_CARE_PROVIDER_SITE_OTHER): Payer: Medicaid Other | Admitting: Licensed Clinical Social Worker

## 2022-06-16 VITALS — BP 105/64 | HR 88 | Wt 144.7 lb

## 2022-06-16 DIAGNOSIS — Z3A18 18 weeks gestation of pregnancy: Secondary | ICD-10-CM

## 2022-06-16 DIAGNOSIS — Z3480 Encounter for supervision of other normal pregnancy, unspecified trimester: Secondary | ICD-10-CM | POA: Insufficient documentation

## 2022-06-16 DIAGNOSIS — Z3482 Encounter for supervision of other normal pregnancy, second trimester: Secondary | ICD-10-CM

## 2022-06-16 NOTE — Progress Notes (Signed)
Subjective:    Erica Clements is being seen today for her first obstetrical visit.  This is a planned pregnancy. She is at [redacted]w[redacted]d gestation. Her obstetrical history is significant for  none . Relationship with FOB: significant other, living together. Patient does intend to breast feed. Pregnancy history fully reviewed.  The information documented in the HPI was reviewed and verified.  Menstrual History: OB History     Gravida  5   Para  3   Term  3   Preterm  0   AB  1   Living  3      SAB  1   IAB  0   Ectopic  0   Multiple  0   Live Births  3          Patient's last menstrual period was 02/12/2022.    Past Medical History:  Diagnosis Date   Medical history non-contributory    Stomach ulcer     Past Surgical History:  Procedure Laterality Date   FEMUR IM NAIL Left 09/12/2016   Procedure: INTRAMEDULLARY (IM) NAIL FEMORAL;  Surgeon: Nadara Mustard, MD;  Location: [facility] OR;  Service: Orthopedics;  Laterality: Left;    (Not in a hospital admission)  No Known Allergies  Social History   Tobacco Use   Smoking status: Former    Types: Cigarettes   Smokeless tobacco: Never  Substance Use Topics   Alcohol use: Not Currently    Comment: Socially    Family History  Problem Relation Age of Onset   Cancer Paternal Grandmother    Diabetes Paternal Grandmother    Diabetes Father      Review of Systems Constitutional: negative for weight loss Gastrointestinal: negative for vomiting Genitourinary:negative for genital lesions and vaginal discharge and dysuria Musculoskeletal:negative for back pain Behavioral/Psych: negative for abusive relationship, depression, illegal drug usage and tobacco use    Objective:    BP 105/64   Pulse 88   Wt 144 lb 11.2 oz (65.6 kg)   LMP 02/12/2022   BMI 24.08 kg/m  General Appearance:    Alert, cooperative, no distress, appears stated age  Head:    Normocephalic, without obvious abnormality, atraumatic  Eyes:    PERRL,  conjunctiva/corneas clear, EOM's intact, fundi    benign, both eyes  Ears:    Normal TM's and external ear canals, both ears  Nose:   Nares normal, septum midline, mucosa normal, no drainage    or sinus tenderness  Throat:   Lips, mucosa, and tongue normal; teeth and gums normal  Neck:   Supple, symmetrical, trachea midline, no adenopathy;    thyroid:  no enlargement/tenderness/nodules; no carotid   bruit or JVD  Back:     Symmetric, no curvature, ROM normal, no CVA tenderness  Lungs:     Clear to auscultation bilaterally, respirations unlabored  Chest Wall:    No tenderness or deformity   Heart:    Regular rate and rhythm, S1 and S2 normal, no murmur, rub   or gallop  Breast Exam:    No tenderness, masses, or nipple abnormality  Abdomen:     Soft, non-tender, bowel sounds active all four quadrants,    no masses, no organomegaly  Genitalia:    Normal female without lesion, discharge or tenderness  Extremities:   Extremities normal, atraumatic, no cyanosis or edema  Pulses:   2+ and symmetric all extremities  Skin:   Skin color, texture, turgor normal, no rashes or lesions  Lymph nodes:  Cervical, supraclavicular, and axillary nodes normal  Neurologic:   CNII-XII intact, normal strength, sensation and reflexes    throughout      Lab Review Urine pregnancy test Labs reviewed yes Radiologic studies reviewed no  Assessment:    Pregnancy at [redacted]w[redacted]d weeks    Plan:    1. Supervision of other normal pregnancy, antepartum Rx: - Cytology - PAP( [place])   Prenatal vitamins.  Counseling provided regarding continued use of seat belts, cessation of alcohol consumption, smoking or use of illicit drugs; infection precautions i.e., influenza/TDAP immunizations, toxoplasmosis,CMV, parvovirus, listeria and varicella; workplace safety, exercise during pregnancy; routine dental care, safe medications, sexual activity, hot tubs, saunas, pools, travel, caffeine use, fish and methlymercury,  potential toxins, hair treatments, varicose veins Weight gain recommendations per IOM guidelines reviewed: underweight/BMI< 18.5--> gain 28 - 40 lbs; normal weight/BMI 18.5 - 24.9--> gain 25 - 35 lbs; overweight/BMI 25 - 29.9--> gain 15 - 25 lbs; obese/BMI >30->gain  11 - 20 lbs Problem list reviewed and updated. FIRST/CF mutation testing/NIPT/QUAD SCREEN/fragile X/Ashkenazi Jewish population testing/Spinal muscular atrophy discussed: requested. Role of ultrasound in pregnancy discussed; fetal survey: requested. Amniocentesis discussed: not indicated  Follow up in 4 weeks  I have spent a total of 25 minutes of face-to-face time, excluding clinical staff time, reviewing notes and preparing to see patient, ordering tests and/or medications, and counseling the patient.  Jenine Krisher A. Clearance Coots MD 06/16/2022

## 2022-06-16 NOTE — Progress Notes (Signed)
Patient presents for New OB. All labs previously completed at Intake visit. No other concerns.

## 2022-06-16 NOTE — BH Specialist Note (Signed)
Integrated Behavioral Health via Telemedicine Visit  06/16/2022 [name] [id number]  Number of Integrated Behavioral Health Clinician visits: 1 Session Start time: 11:00am  Session End time: 11:15am Total time in minutes: 15 mins via phone  Referring Provider: Aron Baba  Patient/Family location: Home  [facility] Provider location: [facility]  All persons participating in visit: Erica Clements and LCSW A. Felton Clinton  Types of Service: Telephone visit and General Behavioral Integrated Care (BHI)  I connected with Erica Clements and/or Erica Clements n/a  via  Telephone or Engineer, civil (consulting)  (Video is Caregility application) and verified that I am speaking with the correct person using two identifiers. Discussed confidentiality: Yes   I discussed the limitations of telemedicine and the availability of in person appointments.  Discussed there is a possibility of technology failure and discussed alternative modes of communication if that failure occurs.  I discussed that engaging in this telemedicine visit, they consent to the provision of behavioral healthcare and the services will be billed under their insurance.  Patient and/or legal guardian expressed understanding and consented to Telemedicine visit: Yes   Presenting Concerns: Patient and/or family reports the following symptoms/concerns: No concerns reported  Duration of problem: n/a; Severity of problem: n/a  Patient and/or Family's Strengths/Protective Factors: Concrete supports in place (healthy food, safe environments, etc.)  Goals Addressed: Patient will:  Keep medical appts  Take prenatal vitamins  Prioritize rest   They were advised to call back or seek an in-person evaluation if the symptoms worsen or if the condition fails to improve as anticipated.  Erica Saxon, LCSW

## 2022-06-18 ENCOUNTER — Ambulatory Visit: Payer: Medicaid Other

## 2022-06-18 ENCOUNTER — Ambulatory Visit: Payer: Medicaid Other | Attending: Obstetrics and Gynecology

## 2022-06-18 DIAGNOSIS — Z3480 Encounter for supervision of other normal pregnancy, unspecified trimester: Secondary | ICD-10-CM | POA: Diagnosis not present

## 2022-06-18 DIAGNOSIS — O321XX0 Maternal care for breech presentation, not applicable or unspecified: Secondary | ICD-10-CM | POA: Diagnosis not present

## 2022-06-18 DIAGNOSIS — Z363 Encounter for antenatal screening for malformations: Secondary | ICD-10-CM | POA: Insufficient documentation

## 2022-06-18 DIAGNOSIS — Z3A19 19 weeks gestation of pregnancy: Secondary | ICD-10-CM | POA: Diagnosis not present

## 2022-06-23 LAB — CYTOLOGY - PAP
Comment: NEGATIVE
Diagnosis: NEGATIVE
Diagnosis: REACTIVE
High risk HPV: NEGATIVE

## 2022-07-02 ENCOUNTER — Other Ambulatory Visit (HOSPITAL_COMMUNITY)
Admission: RE | Admit: 2022-07-02 | Discharge: 2022-07-02 | Disposition: A | Discharge status: Home / Self Care | Payer: Medicaid Other | Source: Ambulatory Visit | Attending: Obstetrics and Gynecology | Admitting: Obstetrics and Gynecology

## 2022-07-02 ENCOUNTER — Ambulatory Visit (INDEPENDENT_AMBULATORY_CARE_PROVIDER_SITE_OTHER): Payer: Medicaid Other | Admitting: General Practice

## 2022-07-02 VITALS — BP 95/59 | HR 75 | Ht 65.0 in | Wt 144.0 lb

## 2022-07-02 DIAGNOSIS — N898 Other specified noninflammatory disorders of vagina: Secondary | ICD-10-CM | POA: Insufficient documentation

## 2022-07-02 LAB — POCT URINALYSIS DIPSTICK
Bilirubin, UA: NEGATIVE
Blood, UA: NEGATIVE
Glucose, UA: NEGATIVE
Ketones, UA: NEGATIVE
Nitrite, UA: NEGATIVE
Protein, UA: POSITIVE — AB
Spec Grav, UA: 1.01 (ref 1.010–1.025)
Urobilinogen, UA: 1 E.U./dL
pH, UA: 6 (ref 5.0–8.0)

## 2022-07-02 NOTE — Progress Notes (Signed)
SUBJECTIVE:  31 y.o. female complains of white and malodorous vaginal discharge for 2 day(s). Denies abnormal vaginal bleeding or significant pelvic pain or fever. No UTI symptoms. Denies history of known exposure to STD.  Patient's last menstrual period was 02/12/2022.  OBJECTIVE:  She appears well, afebrile. Urine dipstick: positive for protein and positive for leukocytes.  ASSESSMENT:  Vaginal Discharge  Vaginal Odor   PLAN:  GC, chlamydia, trichomonas, BVAG, CVAG probe sent to lab. Treatment: To be determined once lab results are received ROV prn if symptoms persist or worsen.

## 2022-07-05 LAB — URINE CULTURE

## 2022-07-05 NOTE — L&D Delivery Note (Signed)
Delivery Note 32 y.o. [id number] at [redacted]w[redacted]d admitted following PROM earlier today.   At 6:21 PM a viable female was delivered via Vaginal, Spontaneous (Presentation: Left Occiput Anterior).  APGAR: 7, 9; weight  TBD.   Placenta status: Spontaneous, Intact.  Cord: 3 vessels with the following complications: None.  Cord pH: not collected  Anesthesia: Epidural Episiotomy: None Lacerations: None Suture Repair:  None Est. Blood Loss (mL): 52  Mom to postpartum.  Baby to Couplet care / Skin to Skin.  Sheppard Evens MD MPH OB Fellow, Faculty Practice [facility], Center for [facility] 11/06/2022

## 2022-07-06 ENCOUNTER — Other Ambulatory Visit: Payer: Self-pay | Admitting: Obstetrics and Gynecology

## 2022-07-06 LAB — CERVICOVAGINAL ANCILLARY ONLY
Bacterial Vaginitis (gardnerella): POSITIVE — AB
Candida Glabrata: NEGATIVE
Candida Vaginitis: POSITIVE — AB
Chlamydia: NEGATIVE
Comment: NEGATIVE
Comment: NEGATIVE
Comment: NEGATIVE
Comment: NEGATIVE
Comment: NEGATIVE
Comment: NORMAL
Neisseria Gonorrhea: NEGATIVE
Trichomonas: NEGATIVE

## 2022-07-06 MED ORDER — METRONIDAZOLE 500 MG PO TABS: 500.0000 mg | ORAL_TABLET | Freq: Two times a day (BID) | ORAL | 0 refills | Status: DC

## 2022-07-06 MED ORDER — TERCONAZOLE 0.8 % VA CREA: 1.0000 | TOPICAL_CREAM | Freq: Every day | VAGINAL | 0 refills | Status: DC

## 2022-07-08 ENCOUNTER — Other Ambulatory Visit: Payer: Self-pay | Admitting: Emergency Medicine

## 2022-07-08 ENCOUNTER — Other Ambulatory Visit: Payer: Self-pay | Admitting: Obstetrics and Gynecology

## 2022-07-08 MED ORDER — METRONIDAZOLE 0.75 % VA GEL: 1.0000 | Freq: Every day | VAGINAL | 1 refills | Status: DC

## 2022-07-08 MED ORDER — TERCONAZOLE 0.8 % VA CREA: 1.0000 | TOPICAL_CREAM | Freq: Every day | VAGINAL | 0 refills | Status: DC

## 2022-07-09 ENCOUNTER — Other Ambulatory Visit: Payer: Self-pay | Admitting: Obstetrics

## 2022-07-14 ENCOUNTER — Encounter: Payer: Self-pay | Admitting: Obstetrics and Gynecology

## 2022-07-14 ENCOUNTER — Ambulatory Visit (INDEPENDENT_AMBULATORY_CARE_PROVIDER_SITE_OTHER): Payer: Medicaid Other | Admitting: Obstetrics and Gynecology

## 2022-07-14 VITALS — BP 100/61 | HR 85 | Wt 148.0 lb

## 2022-07-14 DIAGNOSIS — Z3480 Encounter for supervision of other normal pregnancy, unspecified trimester: Secondary | ICD-10-CM

## 2022-07-14 DIAGNOSIS — Z3482 Encounter for supervision of other normal pregnancy, second trimester: Secondary | ICD-10-CM

## 2022-07-14 DIAGNOSIS — Z3A22 22 weeks gestation of pregnancy: Secondary | ICD-10-CM

## 2022-07-14 NOTE — Progress Notes (Signed)
Patient presents for ROB. Patient has no concerns today. 

## 2022-07-14 NOTE — Patient Instructions (Signed)
Oral Glucose Tolerance Test During Pregnancy Why am I having this test? The oral glucose tolerance test (OGTT) is done to check how your body processes blood sugar (glucose). This is one of several tests used to diagnose diabetes that develops during pregnancy (gestational diabetes mellitus). Gestational diabetes is a short-term form of diabetes that some women develop while they are pregnant. It usually occurs during the second trimester of pregnancy and goes away after delivery. Testing, or screening, for gestational diabetes usually occurs at weeks 24-28 of pregnancy. You may have the OGTT test after having a 1-hour glucose screening test if the results from that test indicate that you may have gestational diabetes. This test may also be needed if: You have a history of gestational diabetes. There is a history of giving birth to very large babies or of losing pregnancies (having stillbirths). You have signs and symptoms of diabetes, such as: Changes in your eyesight. Tingling or numbness in your hands or feet. Changes in hunger, thirst, and urination, and these are not explained by your pregnancy. What is being tested? This test measures the amount of glucose in your blood at different times during a period of 3 hours. This shows how well your body can process glucose. What kind of sample is taken?  Blood samples are required for this test. They are usually collected by inserting a needle into a blood vessel. How do I prepare for this test? For 3 days before your test, eat normally. Have plenty of carbohydrate-rich foods. Follow instructions from your health care provider about: Eating or drinking restrictions on the day of the test. You may be asked not to eat or drink anything other than water (to fast) starting 8-10 hours before the test. Changing or stopping your regular medicines. Some medicines may interfere with this test. Tell a health care provider about: All medicines you are  taking, including vitamins, herbs, eye drops, creams, and over-the-counter medicines. Any blood disorders you have. Any surgeries you have had. Any medical conditions you have. What happens during the test? First, your blood glucose will be measured. This is referred to as your fasting blood glucose because you fasted before the test. Then, you will drink a glucose solution that contains a certain amount of glucose. Your blood glucose will be measured again 1, 2, and 3 hours after you drink the solution. This test takes about 3 hours to complete. You will need to stay at the testing location during this time. During the testing period: Do not eat or drink anything other than the glucose solution. Do not exercise. Do not use any products that contain nicotine or tobacco, such as cigarettes, e-cigarettes, and chewing tobacco. These can affect your test results. If you need help quitting, ask your health care provider. The testing procedure may vary among health care providers and hospitals. How are the results reported? Your results will be reported as milligrams of glucose per deciliter of blood (mg/dL) or millimoles per liter (mmol/L). There is more than one source for screening and diagnosis reference values used to diagnose gestational diabetes. Your health care provider will compare your results to normal values that were established after testing a large group of people (reference values). Reference values may vary among labs and hospitals. For this test ([place]), reference values are: Fasting: 95 mg/dL (5.3 mmol/L). 1 hour: 180 mg/dL (10.0 mmol/L). 2 hour: 155 mg/dL (8.6 mmol/L). 3 hour: 140 mg/dL (7.8 mmol/L). What do the results mean? Results below the reference values are   considered normal. If two or more of your blood glucose levels are at or above the reference values, you may be diagnosed with gestational diabetes. If only one level is high, your health care provider may  suggest repeat testing or other tests to confirm a diagnosis. Talk with your health care provider about what your results mean. Questions to ask your health care provider Ask your health care provider, or the department that is doing the test: When will my results be ready? How will I get my results? What are my treatment options? What other tests do I need? What are my next steps? Summary The oral glucose tolerance test (OGTT) is one of several tests used to diagnose diabetes that develops during pregnancy (gestational diabetes mellitus). Gestational diabetes is a short-term form of diabetes that some women develop while they are pregnant. You may have the OGTT test after having a 1-hour glucose screening test if the results from that test show that you may have gestational diabetes. You may also have this test if you have any symptoms or risk factors for this type of diabetes. Talk with your health care provider about what your results mean. This information is not intended to replace advice given to you by your health care provider. Make sure you discuss any questions you have with your health care provider. Document Revised: 01/26/2022 Document Reviewed: 11/29/2019 Elsevier Patient Education  2023 Elsevier Inc.  

## 2022-07-16 NOTE — Progress Notes (Signed)
   LOW-RISK PREGNANCY OFFICE VISIT Patient name: Erica Clements MRN [id number]  Date of birth: September 13, 1990 Chief Complaint:   Routine Prenatal Visit  History of Present Illness:   Erica Clements is a 32 y.o. [id number] female at [redacted]w[redacted]d with an Estimated Date of Delivery: 11/11/22 being seen today for ongoing management of a low-risk pregnancy.  Today she reports no complaints. Contractions: Not present. Vag. Bleeding: None.  Movement: Present. denies leaking of fluid. Review of Systems:   Pertinent items are noted in HPI Denies abnormal vaginal discharge w/ itching/odor/irritation, headaches, visual changes, shortness of breath, chest pain, abdominal pain, severe nausea/vomiting, or problems with urination or bowel movements unless otherwise stated above. Pertinent History Reviewed:  Reviewed past medical,surgical, social, obstetrical and family history.  Reviewed problem list, medications and allergies. Physical Assessment:   Vitals:   07/14/22 1130  BP: 100/61  Pulse: 85  Weight: 148 lb (67.1 kg)  Body mass index is 24.63 kg/m.        Physical Examination:   General appearance: Well appearing, and in no distress  Mental status: Alert, oriented to person, place, and time  Skin: Warm & dry  Cardiovascular: Normal heart rate noted  Respiratory: Normal respiratory effort, no distress  Abdomen: Soft, gravid, nontender  Pelvic: Cervical exam deferred         Extremities: Edema: Trace  Fetal Status: Fetal Heart Rate (bpm): 145 Fundal Height: 22 cm Movement: Present    No results found for this or any previous visit (from the past 24 hour(s)).  Assessment & Plan:  1) Low-risk pregnancy [id number] at [redacted]w[redacted]d with an Estimated Date of Delivery: 11/11/22   2) Supervision of other normal pregnancy, antepartum - Anticipatory guidance for 2 hr GTT - advised to fast after midnight without anything to eat or drink (except for water), will have fasting blood drawn, drink the glucola drink (flavor choices:  orange or fruit punch), have a visit with a provider during the first hour of testing, wait in the lab waiting room to have blood drawn at 1 hour and then 2 hours after finishing glucola drink.   3) [redacted] weeks gestation of pregnancy    Meds: No orders of the defined types were placed in this encounter.  Labs/procedures today: none  Plan:  Continue routine obstetrical care   Reviewed: Preterm labor symptoms and general obstetric precautions including but not limited to vaginal bleeding, contractions, leaking of fluid and fetal movement were reviewed in detail with the patient.  All questions were answered. Has home bp cuff. Check bp weekly, let us know if >140/90.   Follow-up: Return in about 4 weeks (around 08/11/2022) for Return OB 2hr GTT.  No orders of the defined types were placed in this encounter.  Laury Deep MSN, CNM 07/14/2022 3:42 PM

## 2022-07-26 ENCOUNTER — Other Ambulatory Visit: Payer: Self-pay

## 2022-07-26 DIAGNOSIS — B9689 Other specified bacterial agents as the cause of diseases classified elsewhere: Secondary | ICD-10-CM

## 2022-07-26 MED ORDER — METRONIDAZOLE 500 MG PO TABS: 500.0000 mg | ORAL_TABLET | Freq: Two times a day (BID) | ORAL | 0 refills | Status: DC

## 2022-08-11 ENCOUNTER — Other Ambulatory Visit: Payer: Medicaid Other

## 2022-08-11 ENCOUNTER — Ambulatory Visit (INDEPENDENT_AMBULATORY_CARE_PROVIDER_SITE_OTHER): Payer: Medicaid Other

## 2022-08-11 VITALS — BP 110/64 | HR 94 | Wt 154.9 lb

## 2022-08-11 DIAGNOSIS — O26892 Other specified pregnancy related conditions, second trimester: Secondary | ICD-10-CM | POA: Diagnosis not present

## 2022-08-11 DIAGNOSIS — Z3480 Encounter for supervision of other normal pregnancy, unspecified trimester: Secondary | ICD-10-CM | POA: Diagnosis not present

## 2022-08-11 DIAGNOSIS — R519 Headache, unspecified: Secondary | ICD-10-CM

## 2022-08-11 DIAGNOSIS — Z3A26 26 weeks gestation of pregnancy: Secondary | ICD-10-CM

## 2022-08-11 MED ORDER — MAGNESIUM OXIDE -MG SUPPLEMENT 400 MG PO CAPS: 400.0000 mg | ORAL_CAPSULE | Freq: Every day | ORAL | 1 refills | Status: DC

## 2022-08-11 NOTE — Progress Notes (Signed)
LOW-RISK PREGNANCY OFFICE VISIT  Patient name: Erica Clements MRN [id number]  Date of birth: 22-Sep-1990 Chief Complaint:   Routine Prenatal Visit  Subjective:   [name] is a 32 y.o. [phone] female at [redacted]w[redacted]d with an Estimated Date of Delivery: 11/11/22 being seen today for ongoing management of a low-risk pregnancy aeb has Closed displaced transverse fracture of shaft of left femur ([place]); Closed supracondylar fracture of left femur ([place]); Limited prenatal care in third trimester; Supervision of high risk pregnancy, antepartum; History of cryosurgery; Pregnancy with adoption planned, third trimester; Encounter for induction of labor; and Supervision of other normal pregnancy, antepartum on their problem list.  Patient presents today, alone, with headache.  She states she has been experiencing daily HA despite tylenol usage.  She reports taking tylenol XR in the am and tylenol PM at bedtime.  She reports drinking about 3 bottles of water daily.  Patient endorses fetal movement. Patient reports abdominal cramping, but states she has rare contractions.  Patient denies vaginal concerns including abnormal discharge, leaking of fluid, and bleeding. No issues with urination, constipation, or diarrhea.    Contractions: Not present. Vag. Bleeding: None.  Movement: Present.  Reviewed past medical,surgical, social, obstetrical and family history as well as problem list, medications and allergies.  Objective   Vitals:   08/11/22 0842 08/11/22 0853  BP: 110/64 110/64  Pulse: 94 94  Weight: 154 lb 14.4 oz (70.3 kg) 154 lb 14.4 oz (70.3 kg)  Body mass index is 25.78 kg/m.  Total Weight Gain:Not found.         Physical Examination:   General appearance: Well appearing, and in no distress  Mental status: Alert, oriented to person, place, and time  Skin: Warm & dry  Cardiovascular: Normal heart rate noted  Respiratory: Normal respiratory effort, no distress  Abdomen: Soft, gravid, nontender, AGA with  Fundal Height: 26 cm  Pelvic: Cervical exam deferred           Extremities: Edema: None  Fetal Status: Fetal Heart Rate (bpm): 135  Movement: Present   No results found for this or any previous visit (from the past 24 hour(s)).  Assessment & Plan:  Low-risk pregnancy of a 32 y.o., [id number] at [redacted]w[redacted]d with an Estimated Date of Delivery: 11/11/22   1. Supervision of other normal pregnancy, antepartum -Anticipatory guidance for upcoming appts. -Patient to schedule next appt in 2 weeks for an in-person visit. -Patient problem list with h/o BUFA in previous pregnancy.  Patient confirms infant adopted by family member.   2. [redacted] weeks gestation of pregnancy -Glucola appt completed today.  -Reviewed blood draw procedures and labs which also include check of iron/HgB level, RPR, and HIV *Informed that repeat RPR/HIV are for pediatric records/compliance.  -Discussed how results of GTT are handled including diabetic education and BS testing for abnormal results and routine care for normal results.     3. Headache in pregnancy, antepartum, second trimester -Discussed increased water intake. -If no improvement after 2 days, plan to proceed with magnesium supplements.  -Instructed to contact office if no improvement after one week for neurology consult.  -Rx for magnesium sent to pharmacy on file.       Meds: No orders of the defined types were placed in this encounter.  Labs/procedures today:  Lab Orders  No laboratory test(s) ordered today     Reviewed: Preterm labor symptoms and general obstetric precautions including but not limited to vaginal bleeding, contractions, leaking of fluid and fetal movement were reviewed  in detail with the patient.  All questions were answered.  Follow-up: No follow-ups on file.  No orders of the defined types were placed in this encounter.  Maryann Conners MSN, CNM 08/11/2022

## 2022-08-11 NOTE — Progress Notes (Signed)
Pt presents for ROB visit. Pt c/o headaches daily that are not alleviated by Tylenol. Declines Tdap.

## 2022-08-12 LAB — RPR: RPR Ser Ql: NONREACTIVE

## 2022-08-12 LAB — CBC
Hematocrit: 33.5 % — ABNORMAL LOW (ref 34.0–46.6)
Hemoglobin: 11.6 g/dL (ref 11.1–15.9)
MCH: 31.1 pg (ref 26.6–33.0)
MCHC: 34.6 g/dL (ref 31.5–35.7)
MCV: 90 fL (ref 79–97)
Platelets: 332 10*3/uL (ref 150–450)
RBC: 3.73 x10E6/uL — ABNORMAL LOW (ref 3.77–5.28)
RDW: 11.6 % — ABNORMAL LOW (ref 11.7–15.4)
WBC: 6.1 10*3/uL (ref 3.4–10.8)

## 2022-08-12 LAB — GLUCOSE TOLERANCE, 2 HOURS W/ 1HR
Glucose, 1 hour: 178 mg/dL (ref 70–179)
Glucose, 2 hour: 138 mg/dL (ref 70–152)
Glucose, Fasting: 79 mg/dL (ref 70–91)

## 2022-08-12 LAB — HIV ANTIBODY (ROUTINE TESTING W REFLEX): HIV Screen 4th Generation wRfx: NONREACTIVE

## 2022-08-24 ENCOUNTER — Other Ambulatory Visit: Payer: Self-pay

## 2022-09-08 ENCOUNTER — Other Ambulatory Visit (HOSPITAL_COMMUNITY)
Admission: RE | Admit: 2022-09-08 | Discharge: 2022-09-08 | Disposition: A | Discharge status: Home / Self Care | Payer: Medicaid Other | Source: Ambulatory Visit | Attending: Obstetrics | Admitting: Obstetrics

## 2022-09-08 ENCOUNTER — Encounter: Payer: Self-pay | Admitting: Obstetrics

## 2022-09-08 ENCOUNTER — Ambulatory Visit (INDEPENDENT_AMBULATORY_CARE_PROVIDER_SITE_OTHER): Payer: Medicaid Other | Admitting: Obstetrics

## 2022-09-08 VITALS — BP 112/64 | HR 98 | Wt 164.0 lb

## 2022-09-08 DIAGNOSIS — Z3A30 30 weeks gestation of pregnancy: Secondary | ICD-10-CM

## 2022-09-08 DIAGNOSIS — Z3483 Encounter for supervision of other normal pregnancy, third trimester: Secondary | ICD-10-CM

## 2022-09-08 DIAGNOSIS — N898 Other specified noninflammatory disorders of vagina: Secondary | ICD-10-CM | POA: Insufficient documentation

## 2022-09-08 DIAGNOSIS — Z3480 Encounter for supervision of other normal pregnancy, unspecified trimester: Secondary | ICD-10-CM

## 2022-09-08 NOTE — Progress Notes (Signed)
Subjective:  Erica Clements is a 32 y.o. [id number] at 69w6dbeing seen today for ongoing prenatal care.  She is currently monitored for the following issues for this low-risk pregnancy and has [place]; Supervision of high risk pregnancy, antepartum; History of cryosurgery; Pregnancy with adoption planned, third trimester; and Supervision of other normal pregnancy, antepartum on their problem list.  Patient reports no complaints.  Contractions: Not present. Vag. Bleeding: None.  Movement: Present. Denies leaking of fluid.   The following portions of the patient's history were reviewed and updated as appropriate: allergies, current medications, past family history, past medical history, past social history, past surgical history and problem list. Problem list updated.  Objective:   Vitals:   09/08/22 0944  BP: 112/64  Pulse: 98  Weight: 164 lb (74.4 kg)    Fetal Status: Fetal Heart Rate (bpm): 140   Movement: Present     General:  Alert, oriented and cooperative. Patient is in no acute distress.  Skin: Skin is warm and dry. No rash noted.   Cardiovascular: Normal heart rate noted  Respiratory: Normal respiratory effort, no problems with respiration noted  Abdomen: Soft, gravid, appropriate for gestational age. Pain/Pressure: Present     Pelvic:  Cervical exam deferred        Extremities: Normal range of motion.  Edema: None  Mental Status: Normal mood and affect. Normal behavior. Normal judgment and thought content.   Urinalysis:      Assessment and Plan:  Pregnancy: [id number] 314w6d1. Supervision of other normal pregnancy, antepartum  2. Vaginal discharge Rx: - [place]( [place] Preterm labor symptoms and general obstetric precautions including but not limited to vaginal bleeding, contractions, leaking of fluid and fetal movement were reviewed in  detail with the patient. Please refer to After Visit Summary for other counseling recommendations.   Return in about 2 weeks (around 09/22/2022).   HaShelly BombardMD 09/08/22

## 2022-09-08 NOTE — Progress Notes (Signed)
ROB, c/o recurrent BV's, white vaginal discharge, odor.  Self swab done

## 2022-09-09 ENCOUNTER — Other Ambulatory Visit: Payer: Self-pay

## 2022-09-09 DIAGNOSIS — N760 Acute vaginitis: Secondary | ICD-10-CM

## 2022-09-09 LAB — CERVICOVAGINAL ANCILLARY ONLY
Bacterial Vaginitis (gardnerella): POSITIVE — AB
Candida Glabrata: NEGATIVE
Candida Vaginitis: NEGATIVE
Chlamydia: NEGATIVE
Comment: NEGATIVE
Comment: NEGATIVE
Comment: NEGATIVE
Comment: NEGATIVE
Comment: NEGATIVE
Comment: NORMAL
Neisseria Gonorrhea: NEGATIVE
Trichomonas: NEGATIVE

## 2022-09-09 MED ORDER — METRONIDAZOLE 0.75 % VA GEL: 1.0000 | Freq: Every day | VAGINAL | 1 refills | Status: DC

## 2022-09-11 ENCOUNTER — Other Ambulatory Visit: Payer: Self-pay | Admitting: Obstetrics

## 2022-09-15 NOTE — Progress Notes (Signed)
Advised pt of results. Has filled RX already. No problems reported.

## 2022-09-24 ENCOUNTER — Encounter: Payer: Medicaid Other | Admitting: Obstetrics

## 2022-09-24 ENCOUNTER — Telehealth: Payer: Self-pay

## 2022-09-24 NOTE — Telephone Encounter (Signed)
R/S APPT 

## 2022-09-27 ENCOUNTER — Encounter: Payer: Medicaid Other | Admitting: Obstetrics

## 2022-09-28 ENCOUNTER — Ambulatory Visit (INDEPENDENT_AMBULATORY_CARE_PROVIDER_SITE_OTHER): Payer: Medicaid Other | Admitting: Obstetrics and Gynecology

## 2022-09-28 VITALS — BP 110/71 | HR 103 | Wt 164.0 lb

## 2022-09-28 DIAGNOSIS — Z3480 Encounter for supervision of other normal pregnancy, unspecified trimester: Secondary | ICD-10-CM

## 2022-09-28 DIAGNOSIS — A568 Sexually transmitted chlamydial infection of other sites: Secondary | ICD-10-CM

## 2022-09-28 DIAGNOSIS — Z3A33 33 weeks gestation of pregnancy: Secondary | ICD-10-CM

## 2022-09-28 DIAGNOSIS — O98313 Other infections with a predominantly sexual mode of transmission complicating pregnancy, third trimester: Secondary | ICD-10-CM

## 2022-09-28 DIAGNOSIS — O98319 Other infections with a predominantly sexual mode of transmission complicating pregnancy, unspecified trimester: Secondary | ICD-10-CM | POA: Insufficient documentation

## 2022-09-28 DIAGNOSIS — Z9889 Other specified postprocedural states: Secondary | ICD-10-CM

## 2022-09-28 NOTE — Progress Notes (Signed)
   PRENATAL VISIT NOTE  Subjective:  Erica Clements is a 32 y.o. [id number] at [redacted]w[redacted]d being seen today for ongoing prenatal care.  She is currently monitored for the following issues for this low-risk pregnancy and has Closed displaced transverse fracture of shaft of left femur ([place]); Closed supracondylar fracture of left femur ([place]); History of cryosurgery; Pregnancy with adoption planned, third trimester; Supervision of other normal pregnancy, antepartum; and Chlamydia trachomatis infection in pregnancy on their problem list.  Patient reports backache and lower abdominal pain , pelvic pressure. Worse with walking.  Contractions: Not present. Vag. Bleeding: None.  Movement: Present. Denies leaking of fluid.   The following portions of the patient's history were reviewed and updated as appropriate: allergies, current medications, past family history, past medical history, past social history, past surgical history and problem list.   Objective:   Vitals:   09/28/22 1101  BP: 110/71  Pulse: (!) 103  Weight: 164 lb (74.4 kg)    Fetal Status: Fetal Heart Rate (bpm): 149 Fundal Height: 33 cm Movement: Present     General:  Alert, oriented and cooperative. Patient is in no acute distress.  Skin: Skin is warm and dry. No rash noted.   Cardiovascular: Normal heart rate noted  Respiratory: Normal respiratory effort, no problems with respiration noted  Abdomen: Soft, gravid, appropriate for gestational age.  Pain/Pressure: Present      Assessment and Plan:  Pregnancy: G5P3013 at [redacted]w[redacted]d 1. Supervision of other normal pregnancy, antepartum Discussed maternity belt, tylenol prn, and OTC topicals (e.g., Tiger Balm)  2. Chlamydia trachomatis infection in mother during first trimester of pregnancy Negative TOC  3. History of cryosurgery  Term labor symptoms and general obstetric precautions including but not limited to vaginal bleeding, contractions, leaking of fluid and fetal movement were reviewed  in detail with the patient. Please refer to After Visit Summary for other counseling recommendations.   Return in about 2 weeks (around 10/12/2022) for return OB at 35 weeks.  Future Appointments  Date Time Provider [place]  10/12/2022  2:30 PM Inez Catalina, MD [facility] None   Inez Catalina, MD

## 2022-09-28 NOTE — Patient Instructions (Signed)
Maternity support belt Tiger Balm Tylenol Extra strength every 8 hours as needed

## 2022-09-28 NOTE — Progress Notes (Signed)
ROB, c/o vaginal pain/pressure.

## 2022-10-12 ENCOUNTER — Encounter: Payer: Medicaid Other | Admitting: Obstetrics and Gynecology

## 2022-10-19 ENCOUNTER — Ambulatory Visit (INDEPENDENT_AMBULATORY_CARE_PROVIDER_SITE_OTHER): Payer: Medicaid Other | Admitting: Obstetrics and Gynecology

## 2022-10-19 ENCOUNTER — Encounter: Payer: Self-pay | Admitting: Obstetrics and Gynecology

## 2022-10-19 ENCOUNTER — Other Ambulatory Visit (HOSPITAL_COMMUNITY)
Admission: RE | Admit: 2022-10-19 | Discharge: 2022-10-19 | Disposition: A | Discharge status: Home / Self Care | Payer: Medicaid Other | Source: Ambulatory Visit | Attending: Obstetrics and Gynecology | Admitting: Obstetrics and Gynecology

## 2022-10-19 VITALS — BP 118/71 | HR 98 | Wt 164.0 lb

## 2022-10-19 DIAGNOSIS — Z3483 Encounter for supervision of other normal pregnancy, third trimester: Secondary | ICD-10-CM

## 2022-10-19 DIAGNOSIS — Z3480 Encounter for supervision of other normal pregnancy, unspecified trimester: Secondary | ICD-10-CM | POA: Diagnosis not present

## 2022-10-19 DIAGNOSIS — Z3A36 36 weeks gestation of pregnancy: Secondary | ICD-10-CM

## 2022-10-19 NOTE — Progress Notes (Signed)
Pt presents for ROB/GBS/GC/CT. Pt requests cx check today.

## 2022-10-19 NOTE — Progress Notes (Signed)
   PRENATAL VISIT NOTE  Subjective:  Erica Clements is a 32 y.o. [id number] at [redacted]w[redacted]d being seen today for ongoing prenatal care.  She is currently monitored for the following issues for this low-risk pregnancy and has Closed displaced transverse fracture of shaft of left femur; Closed supracondylar fracture of left femur; History of cryosurgery; Pregnancy with adoption planned, third trimester; Supervision of other normal pregnancy, antepartum; and Chlamydia trachomatis infection in pregnancy on their problem list.  Patient reports  more pressure .  Contractions: Irregular. Vag. Bleeding: None.  Movement: Present. Denies leaking of fluid.   The following portions of the patient's history were reviewed and updated as appropriate: allergies, current medications, past family history, past medical history, past social history, past surgical history and problem list.   Objective:   Vitals:   10/19/22 1531  BP: 118/71  Pulse: 98  Weight: 164 lb (74.4 kg)   Fetal Status: Fetal Heart Rate (bpm): 140 Fundal Height: 35 cm Movement: Present  Presentation: Vertex  General:  Alert, oriented and cooperative. Patient is in no acute distress.  Skin: Skin is warm and dry. No rash noted.   Cardiovascular: Normal heart rate noted  Respiratory: Normal respiratory effort, no problems with respiration noted  Abdomen: Soft, gravid, appropriate for gestational age.  Pain/Pressure: Present     SCE 3/50/-3 - performed in the presence of a chaperone  Assessment and Plan:  Pregnancy: [id number] at [redacted]w[redacted]d 1. Supervision of other normal pregnancy, antepartum 2. 36 weeks - Culture, beta strep (group b only) - Cervicovaginal ancillary only( [place])  Please refer to After Visit Summary for other counseling recommendations.   Return in about 1 week (around 10/26/2022) for return OB at 37 weeks.  No future appointments.  Lennart Pall, MD

## 2022-10-20 LAB — CERVICOVAGINAL ANCILLARY ONLY
Chlamydia: NEGATIVE
Comment: NEGATIVE
Comment: NORMAL
Neisseria Gonorrhea: NEGATIVE

## 2022-10-23 LAB — CULTURE, BETA STREP (GROUP B ONLY): Strep Gp B Culture: NEGATIVE

## 2022-10-26 ENCOUNTER — Ambulatory Visit (INDEPENDENT_AMBULATORY_CARE_PROVIDER_SITE_OTHER): Payer: Medicaid Other | Admitting: Obstetrics and Gynecology

## 2022-10-26 ENCOUNTER — Encounter: Payer: Medicaid Other | Admitting: Obstetrics and Gynecology

## 2022-10-26 VITALS — BP 113/71 | HR 106 | Wt 173.0 lb

## 2022-10-26 DIAGNOSIS — Z3A37 37 weeks gestation of pregnancy: Secondary | ICD-10-CM

## 2022-10-26 DIAGNOSIS — Z3480 Encounter for supervision of other normal pregnancy, unspecified trimester: Secondary | ICD-10-CM

## 2022-10-26 NOTE — Progress Notes (Signed)
   PRENATAL VISIT NOTE  Subjective:  Erica Clements is a 32 y.o. [id number] at [redacted]w[redacted]d being seen today for ongoing prenatal care.  She is currently monitored for the following issues for this low-risk pregnancy and has Closed displaced transverse fracture of shaft of left femur; Closed supracondylar fracture of left femur; History of cryosurgery; Pregnancy with adoption planned, third trimester; Supervision of other normal pregnancy, antepartum; and Chlamydia trachomatis infection in pregnancy on their problem list.  Patient reports no complaints.  Contractions: Not present. Vag. Bleeding: None.  Movement: Present. Denies leaking of fluid.   The following portions of the patient's history were reviewed and updated as appropriate: allergies, current medications, past family history, past medical history, past social history, past surgical history and problem list.   Objective:   Vitals:   10/26/22 1414  BP: 113/71  Pulse: (!) 106  Weight: 173 lb (78.5 kg)    Fetal Status: Fetal Heart Rate (bpm): 140 Fundal Height: 36 cm Movement: Present     General:  Alert, oriented and cooperative. Patient is in no acute distress.  Skin: Skin is warm and dry. No rash noted.   Cardiovascular: Normal heart rate noted  Respiratory: Normal respiratory effort, no problems with respiration noted  Abdomen: Soft, gravid, appropriate for gestational age.  Pain/Pressure: Present      Assessment and Plan:  Pregnancy: G5P3013 at [redacted]w[redacted]d 1. Supervision of other normal pregnancy, antepartum Discussed MOC. Initially interested in tubal, but after discussion of risks and LARC decided against it. Plans for Depo with possible IUD at postpartum appointment. Declines postplacental IUD. 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  Term labor symptoms and general obstetric precautions including but not limited to vaginal bleeding, contractions, leaking of fluid and fetal movement were reviewed in  detail with the patient.  Please refer to After Visit Summary for other counseling recommendations.   Return in about 1 week (around 11/02/2022) for return OB at 38 weeks.  No future appointments.  Lennart Pall, MD

## 2022-11-03 ENCOUNTER — Encounter: Payer: Self-pay | Admitting: Obstetrics and Gynecology

## 2022-11-03 ENCOUNTER — Ambulatory Visit (INDEPENDENT_AMBULATORY_CARE_PROVIDER_SITE_OTHER): Payer: Medicaid Other | Admitting: Obstetrics and Gynecology

## 2022-11-03 VITALS — BP 113/74 | HR 98 | Wt 165.0 lb

## 2022-11-03 DIAGNOSIS — Z3480 Encounter for supervision of other normal pregnancy, unspecified trimester: Secondary | ICD-10-CM

## 2022-11-03 NOTE — Progress Notes (Signed)
Pt states increase in ctx, would like cervix check today.

## 2022-11-03 NOTE — Progress Notes (Signed)
   PRENATAL VISIT NOTE  Subjective:  Erica Clements is a 32 y.o. [id number] at [redacted]w[redacted]d being seen today for ongoing prenatal care.  She is currently monitored for the following issues for this low-risk pregnancy and has Closed displaced transverse fracture of shaft of left femur ([facility]); Closed supracondylar fracture of left femur ([facility]); History of cryosurgery; Pregnancy with adoption planned, third trimester; Supervision of other normal pregnancy, antepartum; and Chlamydia trachomatis infection in pregnancy on their problem list.  Patient reports no complaints.  Contractions: Irregular. Vag. Bleeding: None.  Movement: Present. Denies leaking of fluid.   The following portions of the patient's history were reviewed and updated as appropriate: allergies, current medications, past family history, past medical history, past social history, past surgical history and problem list.   Objective:   Vitals:   11/03/22 1447  BP: 113/74  Pulse: 98  Weight: 165 lb (74.8 kg)    Fetal Status: Fetal Heart Rate (bpm): 140 Fundal Height: 37 cm Movement: Present     General:  Alert, oriented and cooperative. Patient is in no acute distress.  Skin: Skin is warm and dry. No rash noted.   Cardiovascular: Normal heart rate noted  Respiratory: Normal respiratory effort, no problems with respiration noted  Abdomen: Soft, gravid, appropriate for gestational age.  Pain/Pressure: Present     Pelvic: Cervical exam performed in the presence of a chaperone Dilation: 3 Effacement (%): 50 Station: -3  Extremities: Normal range of motion.     Mental Status: Normal mood and affect. Normal behavior. Normal judgment and thought content.   Assessment and Plan:  Pregnancy: A5W0981 at [redacted]w[redacted]d 1. Supervision of other normal pregnancy, antepartum Patient is doing well without complaints Plan for IOL at 41 weeks if no SOL  Term labor symptoms and general obstetric precautions including but not limited to vaginal bleeding,  contractions, leaking of fluid and fetal movement were reviewed in detail with the patient. Please refer to After Visit Summary for other counseling recommendations.   Return in about 1 week (around 11/10/2022) for in person, ROB, Low risk.  No future appointments.  Catalina Antigua, MD

## 2022-11-04 ENCOUNTER — Telehealth (HOSPITAL_COMMUNITY): Payer: Self-pay | Admitting: *Deleted

## 2022-11-04 ENCOUNTER — Encounter (HOSPITAL_COMMUNITY): Payer: Self-pay | Admitting: *Deleted

## 2022-11-04 NOTE — Telephone Encounter (Signed)
Preadmission screen  

## 2022-11-06 ENCOUNTER — Inpatient Hospital Stay (HOSPITAL_COMMUNITY): Payer: Medicaid Other | Admitting: Anesthesiology

## 2022-11-06 ENCOUNTER — Encounter (HOSPITAL_COMMUNITY): Payer: Self-pay | Admitting: Obstetrics and Gynecology

## 2022-11-06 ENCOUNTER — Inpatient Hospital Stay (HOSPITAL_COMMUNITY)
Admission: AD | Admit: 2022-11-06 | Discharge: 2022-11-08 | DRG: 807 | Disposition: A | Discharge status: Home / Self Care | Payer: Medicaid Other | Attending: Obstetrics and Gynecology | Admitting: Obstetrics and Gynecology

## 2022-11-06 ENCOUNTER — Other Ambulatory Visit: Payer: Self-pay

## 2022-11-06 DIAGNOSIS — Z3480 Encounter for supervision of other normal pregnancy, unspecified trimester: Secondary | ICD-10-CM

## 2022-11-06 DIAGNOSIS — O9902 Anemia complicating childbirth: Secondary | ICD-10-CM | POA: Diagnosis not present

## 2022-11-06 DIAGNOSIS — Z87891 Personal history of nicotine dependence: Secondary | ICD-10-CM | POA: Diagnosis not present

## 2022-11-06 DIAGNOSIS — O4202 Full-term premature rupture of membranes, onset of labor within 24 hours of rupture: Secondary | ICD-10-CM | POA: Diagnosis not present

## 2022-11-06 DIAGNOSIS — Z3A39 39 weeks gestation of pregnancy: Secondary | ICD-10-CM

## 2022-11-06 DIAGNOSIS — O4292 Full-term premature rupture of membranes, unspecified as to length of time between rupture and onset of labor: Secondary | ICD-10-CM | POA: Diagnosis not present

## 2022-11-06 LAB — CBC
HCT: 31.1 % — ABNORMAL LOW (ref 36.0–46.0)
Hemoglobin: 10.6 g/dL — ABNORMAL LOW (ref 12.0–15.0)
MCH: 29.6 pg (ref 26.0–34.0)
MCHC: 34.1 g/dL (ref 30.0–36.0)
MCV: 86.9 fL (ref 80.0–100.0)
Platelets: 330 10*3/uL (ref 150–400)
RBC: 3.58 MIL/uL — ABNORMAL LOW (ref 3.87–5.11)
RDW: 14.3 % (ref 11.5–15.5)
WBC: 7.6 10*3/uL (ref 4.0–10.5)
nRBC: 0 % (ref 0.0–0.2)

## 2022-11-06 LAB — TYPE AND SCREEN
ABO/RH(D): B POS
Antibody Screen: NEGATIVE

## 2022-11-06 LAB — POCT FERN TEST: POCT Fern Test: POSITIVE

## 2022-11-06 MED ORDER — OXYCODONE-ACETAMINOPHEN 5-325 MG PO TABS: 1.0000 | ORAL_TABLET | ORAL | Status: DC | PRN

## 2022-11-06 MED ORDER — ACETAMINOPHEN 325 MG PO TABS
650.0000 mg | ORAL_TABLET | ORAL | Status: DC | PRN
  Administered 2022-11-07: 650 mg via ORAL
  Filled 2022-11-06: qty 2

## 2022-11-06 MED ORDER — COCONUT OIL OIL
1.0000 | TOPICAL_OIL | Status: DC | PRN
  Administered 2022-11-07: 1 via TOPICAL

## 2022-11-06 MED ORDER — PRENATAL MULTIVITAMIN CH
1.0000 | ORAL_TABLET | Freq: Every day | ORAL | Status: DC
  Administered 2022-11-07 – 2022-11-08 (×2): 1 via ORAL
  Filled 2022-11-06 (×2): qty 1

## 2022-11-06 MED ORDER — ZOLPIDEM TARTRATE 5 MG PO TABS: 5.0000 mg | ORAL_TABLET | Freq: Every evening | ORAL | Status: DC | PRN

## 2022-11-06 MED ORDER — SOD CITRATE-CITRIC ACID 500-334 MG/5ML PO SOLN: 30.0000 mL | ORAL | Status: DC | PRN

## 2022-11-06 MED ORDER — LIDOCAINE HCL (PF) 1 % IJ SOLN: 30.0000 mL | INTRAMUSCULAR | Status: DC | PRN

## 2022-11-06 MED ORDER — PHENYLEPHRINE 80 MCG/ML (10ML) SYRINGE FOR IV PUSH (FOR BLOOD PRESSURE SUPPORT): 80.0000 ug | PREFILLED_SYRINGE | INTRAVENOUS | Status: DC | PRN

## 2022-11-06 MED ORDER — EPHEDRINE 5 MG/ML INJ: 10.0000 mg | INTRAVENOUS | Status: DC | PRN

## 2022-11-06 MED ORDER — ONDANSETRON HCL 4 MG/2ML IJ SOLN: 4.0000 mg | INTRAMUSCULAR | Status: DC | PRN

## 2022-11-06 MED ORDER — FLEET ENEMA 7-19 GM/118ML RE ENEM: 1.0000 | ENEMA | RECTAL | Status: DC | PRN

## 2022-11-06 MED ORDER — OXYCODONE-ACETAMINOPHEN 5-325 MG PO TABS: 2.0000 | ORAL_TABLET | ORAL | Status: DC | PRN

## 2022-11-06 MED ORDER — LACTATED RINGERS IV SOLN
500.0000 mL | INTRAVENOUS | Status: DC | PRN
  Administered 2022-11-06: 1000 mL via INTRAVENOUS

## 2022-11-06 MED ORDER — ONDANSETRON HCL 4 MG/2ML IJ SOLN: 4.0000 mg | Freq: Four times a day (QID) | INTRAMUSCULAR | Status: DC | PRN

## 2022-11-06 MED ORDER — SENNOSIDES-DOCUSATE SODIUM 8.6-50 MG PO TABS
2.0000 | ORAL_TABLET | ORAL | Status: DC
  Administered 2022-11-06 – 2022-11-07 (×2): 2 via ORAL
  Filled 2022-11-06 (×2): qty 2

## 2022-11-06 MED ORDER — FENTANYL-BUPIVACAINE-NACL 0.5-0.125-0.9 MG/250ML-% EP SOLN
EPIDURAL | Status: DC | PRN
  Administered 2022-11-06: 12 mL/h via EPIDURAL

## 2022-11-06 MED ORDER — LACTATED RINGERS IV SOLN: INTRAVENOUS | Status: DC

## 2022-11-06 MED ORDER — LIDOCAINE HCL (PF) 1 % IJ SOLN
INTRAMUSCULAR | Status: DC | PRN
  Administered 2022-11-06: 5 mL via EPIDURAL

## 2022-11-06 MED ORDER — TETANUS-DIPHTH-ACELL PERTUSSIS 5-2.5-18.5 LF-MCG/0.5 IM SUSY: 0.5000 mL | PREFILLED_SYRINGE | Freq: Once | INTRAMUSCULAR | Status: DC

## 2022-11-06 MED ORDER — ONDANSETRON HCL 4 MG PO TABS: 4.0000 mg | ORAL_TABLET | ORAL | Status: DC | PRN

## 2022-11-06 MED ORDER — OXYTOCIN BOLUS FROM INFUSION
333.0000 mL | Freq: Once | INTRAVENOUS | Status: AC
  Administered 2022-11-06: 333 mL via INTRAVENOUS

## 2022-11-06 MED ORDER — BENZOCAINE-MENTHOL 20-0.5 % EX AERO: 1.0000 | INHALATION_SPRAY | CUTANEOUS | Status: DC | PRN

## 2022-11-06 MED ORDER — DIPHENHYDRAMINE HCL 25 MG PO CAPS: 25.0000 mg | ORAL_CAPSULE | Freq: Four times a day (QID) | ORAL | Status: DC | PRN

## 2022-11-06 MED ORDER — DIPHENHYDRAMINE HCL 50 MG/ML IJ SOLN: 12.5000 mg | INTRAMUSCULAR | Status: DC | PRN

## 2022-11-06 MED ORDER — SIMETHICONE 80 MG PO CHEW: 80.0000 mg | CHEWABLE_TABLET | ORAL | Status: DC | PRN

## 2022-11-06 MED ORDER — DIBUCAINE (PERIANAL) 1 % EX OINT: 1.0000 | TOPICAL_OINTMENT | CUTANEOUS | Status: DC | PRN

## 2022-11-06 MED ORDER — WITCH HAZEL-GLYCERIN EX PADS: 1.0000 | MEDICATED_PAD | CUTANEOUS | Status: DC | PRN

## 2022-11-06 MED ORDER — FENTANYL-BUPIVACAINE-NACL 0.5-0.125-0.9 MG/250ML-% EP SOLN
12.0000 mL/h | EPIDURAL | Status: DC | PRN
  Filled 2022-11-06: qty 250

## 2022-11-06 MED ORDER — OXYTOCIN-SODIUM CHLORIDE 30-0.9 UT/500ML-% IV SOLN
2.5000 [IU]/h | INTRAVENOUS | Status: DC
  Administered 2022-11-06: 2.5 [IU]/h via INTRAVENOUS

## 2022-11-06 MED ORDER — ACETAMINOPHEN 325 MG PO TABS: 650.0000 mg | ORAL_TABLET | ORAL | Status: DC | PRN

## 2022-11-06 MED ORDER — LACTATED RINGERS IV SOLN: 500.0000 mL | Freq: Once | INTRAVENOUS | Status: DC

## 2022-11-06 MED ORDER — IBUPROFEN 600 MG PO TABS
600.0000 mg | ORAL_TABLET | Freq: Four times a day (QID) | ORAL | Status: DC
  Administered 2022-11-06 – 2022-11-08 (×7): 600 mg via ORAL
  Filled 2022-11-06 (×7): qty 1

## 2022-11-06 NOTE — MAU Note (Signed)
Analya Salvador is a 32 y.o. at [redacted]w[redacted]d here in MAU reporting: gush of fluid when walking, still coming.  Clear puddle in chair and on floor, no bleeding. Denies pain.  Was 3 +when last checked.  3 prior vag/term deliveries. No complications. GBS neg.  Onset of complaint: 1330 Pain score: none Vitals:   11/06/22 1400 11/06/22 1401  BP:  123/72  Pulse:  (!) 107  Resp:  17  Temp:  98.8 F (37.1 C)  SpO2: 100%      FHT:140 Lab orders placed from triage:  fern

## 2022-11-06 NOTE — MAU Note (Signed)
.  Erica Clements is a 33 y.o. at [redacted]w[redacted]d here in MAU reporting: water broke about 20 min ago clear fluid still leaking out. No ctx. Good fetal movement 3cm last week in office.   Onset of complaint: Pain score: 0 There were no vitals filed for this visit.    Lab orders placed from triage:

## 2022-11-06 NOTE — Anesthesia Preprocedure Evaluation (Signed)
Anesthesia Evaluation  Patient identified by MRN, date of birth, ID band Patient awake    Reviewed: Allergy & Precautions, NPO status , Patient's Chart, lab work & pertinent test results  Airway Mallampati: II  TM Distance: >3 FB Neck ROM: Full    Dental no notable dental hx. (+) Teeth Intact, Dental Advisory Given   Pulmonary former smoker   Pulmonary exam normal breath sounds clear to auscultation       Cardiovascular Exercise Tolerance: Good Normal cardiovascular exam Rhythm:Regular Rate:Normal     Neuro/Psych negative neurological ROS  negative psych ROS   GI/Hepatic negative GI ROS, PUD,,,  Endo/Other  negative endocrine ROS    Renal/GU negative Renal ROS     Musculoskeletal negative musculoskeletal ROS (+)    Abdominal   Peds  Hematology  (+) Blood dyscrasia, anemia Lab Results      Component                Value               Date                      WBC                      7.6                 11/06/2022                HGB                      10.6 (L)            11/06/2022                HCT                      31.1 (L)            11/06/2022                MCV                      86.9                11/06/2022                PLT                      330                 11/06/2022              Anesthesia Other Findings   Reproductive/Obstetrics (+) Pregnancy                             Anesthesia Physical Anesthesia Plan  ASA: 2  Anesthesia Plan: Epidural   Post-op Pain Management:    Induction:   PONV Risk Score and Plan:   Airway Management Planned:   Additional Equipment:   Intra-op Plan:   Post-operative Plan:   Informed Consent: I have reviewed the patients History and Physical, chart, labs and discussed the procedure including the risks, benefits and alternatives for the proposed anesthesia with the patient or authorized representative who has indicated  his/her understanding and acceptance.       Plan Discussed with:  Anesthesia Plan Comments: (39.4 wk G5p3 for LEA)        Anesthesia Quick Evaluation

## 2022-11-06 NOTE — Anesthesia Procedure Notes (Signed)
Epidural Patient location during procedure: OB Start time: 11/06/2022 3:57 PM End time: 11/06/2022 4:13 PM  Staffing Anesthesiologist: Trevor Iha, MD Performed: anesthesiologist   Preanesthetic Checklist Completed: patient identified, IV checked, site marked, risks and benefits discussed, surgical consent, monitors and equipment checked, pre-op evaluation and timeout performed  Epidural Patient position: sitting Prep: DuraPrep and site prepped and draped Patient monitoring: continuous pulse ox and blood pressure Approach: midline Location: L3-L4 Injection technique: LOR air  Needle:  Needle type: Tuohy  Needle gauge: 17 G Needle length: 9 cm and 9 Needle insertion depth: 8 cm Catheter type: closed end flexible Catheter size: 19 Gauge Catheter at skin depth: 13 cm Test dose: negative  Assessment Events: blood not aspirated, no cerebrospinal fluid, injection not painful, no injection resistance, no paresthesia and negative IV test  Additional Notes Patient identified. Risks/Benefits/Options discussed with patient including but not limited to bleeding, infection, nerve damage, paralysis, failed block, incomplete pain control, headache, blood pressure changes, nausea, vomiting, reactions to medication both or allergic, itching and postpartum back pain. Confirmed with bedside nurse the patient's most recent platelet count. Confirmed with patient that they are not currently taking any anticoagulation, have any bleeding history or any family history of bleeding disorders. Patient expressed understanding and wished to proceed. All questions were answered. Sterile technique was used throughout the entire procedure. Please see nursing notes for vital signs. Test dose was given through epidural needle and negative prior to continuing to dose epidural or start infusion. Warning signs of high block given to the patient including shortness of breath, tingling/numbness in hands, complete motor  block, or any concerning symptoms with instructions to call for help. Patient was given instructions on fall risk and not to get out of bed. All questions and concerns addressed with instructions to call with any issues.   2Attempt (S) . Patient tolerated procedure well.

## 2022-11-06 NOTE — H&P (Signed)
OBSTETRIC ADMISSION HISTORY AND PHYSICAL  Erica Clements is a 32 y.o. female [phone] with IUP at [redacted]w[redacted]d by 11 week [place] presenting to MAU following PROM that occurred about 1330 today with clear fluid. She tested ROM positive in the MAU. She reports +FMs, no VB, no blurry vision, headaches or peripheral edema, and RUQ pain.  She plans on breast feeding. She request depo for birth control, with a plan for an IUD outpatient. She received her prenatal [facility] at [facility]   Dating: By 10 week [place] --->  Estimated Date of Delivery: 11/11/22  Sono:    @[redacted]w[redacted]d , CWD, normal anatomy, cephalic presentation, 273g, 42% EFW   Prenatal History/Complications:  History of chlamydia in 1st trim, treated with negative TOC.   Nursing Staff Provider  Office Location  [place] Dating  11/19/2022, by Last Menstrual Period  [facility] Model [ X] Traditional [ ]  Centering [ ]  Mom-Baby Dyad Anatomy US  Normal  Language   English    Flu Vaccine  Declined Genetic/Carrier Screen  NIPS:  LR  AFP:   negative [place]: neg x 4  TDaP Vaccine   Declined 08-11-22 Hgb A1C or  GTT Early  Third trimester normal   COVID Vaccine  yes   LAB RESULTS   Rhogam  B/Positive/-- (11/28 1121)  Blood Type B/Positive/-- (11/28 1121)   Baby Feeding Plan  breast Antibody Negative (11/28 1121)  Contraception  Depo as bridge to possible office IUD Rubella 4.63 (11/28 1121)  Circumcision  N/a RPR Non Reactive (02/07 0837)   Pediatrician   [facility] HBsAg Negative (11/28 1121)   Support Person  [place] HCVAb Non Reactive (11/28 1121)   Prenatal Classes  HIV Non Reactive (02/07 0837)   BTL Consent  GBS Negative/-- (04/16 1603)   VBAC Consent  Pap NILM/HPV negative 06/2022       DME Rx Arly.Keller ] BP cuff [ ]  Weight Scale Waterbirth  [ ]  Class [ ]  Consent [ ]  CNM visit  PHQ9 & GAD7 Arly.Keller  ] new OB [ x ] 28 weeks  [ x ] 36 weeks Induction  [ ]  Orders Entered [ ] Foley Y/N    Past Medical History: Past Medical History:  Diagnosis Date   Medical history  non-contributory    Stomach ulcer     Past Surgical History: Past Surgical History:  Procedure Laterality Date   FEMUR IM NAIL Left 09/12/2016   Procedure: INTRAMEDULLARY (IM) NAIL FEMORAL;  Surgeon: Nadara Mustard, MD;  Location: [facility] OR;  Service: Orthopedics;  Laterality: Left;    Obstetrical History: OB History     Gravida  5   Para  3   Term  3   Preterm  0   AB  1   Living  3      SAB  1   IAB  0   Ectopic  0   Multiple  0   Live Births  3           Social History Social History   Socioeconomic History   Marital status: Single    Spouse name: Not on file   Number of children: Not on file   Years of education: Not on file   Highest education level: Not on file  Occupational History   Not on file  Tobacco Use   Smoking status: Former    Types: Cigarettes   Smokeless tobacco: Never  Vaping Use   Vaping Use: Former  Substance and Sexual Activity   Alcohol  use: Not Currently    Comment: Socially   Drug use: No   Sexual activity: Yes    Birth control/protection: None  Other Topics Concern   Not on file  Social History Narrative   ** Merged History Encounter **       Social Determinants of Health   Financial Resource Strain: Not on file  Food Insecurity: No Food Insecurity (11/06/2022)   Hunger Vital Sign    Worried About Running Out of Food in the Last Year: Never true    Ran Out of Food in the Last Year: Never true  Transportation Needs: No Transportation Needs (11/06/2022)   PRAPARE - Administrator, Civil Service (Medical): No    Lack of Transportation (Non-Medical): No  Physical Activity: Not on file  Stress: Not on file  Social Connections: Not on file    Family History: Family History  Problem Relation Age of Onset   Healthy Mother    Diabetes Father    Cancer Paternal Grandmother    Diabetes Paternal Grandmother     Allergies: No Known Allergies  Medications Prior to Admission  Medication Sig Dispense  Refill Last Dose   Magnesium Oxide -Mg Supplement 400 MG CAPS Take 400 mg by mouth daily. Increase to twice daily if symptoms persists. 60 capsule 1 Past Month   Prenatal 28-0.8 MG TABS Take 1 tablet by mouth daily. 30 tablet 12 Past Month   Blood Pressure Monitoring (BLOOD PRESSURE KIT) DEVI 1 Device by Does not apply route once a week. (Patient not taking: Reported on 07/02/2022) 1 each 0      Review of Systems   All systems reviewed and negative except as stated in HPI  Blood pressure 116/63, pulse 88, temperature 98.8 F (37.1 C), temperature source Oral, resp. rate 17, last menstrual period 02/12/2022, SpO2 100 %.  General appearance: alert, cooperative, and appears stated age Lungs: clear to auscultation bilaterally Heart: regular rate and rhythm Abdomen: soft, non-tender; bowel sounds normal Pelvic: see belwo Extremities: Homans sign is negative, no sign of DVT  Presentation: cephalic Fetal monitoring: 130 bpm. Moderate variabilty, +accels, no decels Uterine activity: contractions every 2-3 mins, painful  Cervix: 4 cm / 80% / -2  Prenatal labs: ABO, Rh: --/--/B POS (05/04 1430) Antibody: NEG (05/04 1430) Rubella: 4.63 (11/28 1121) RPR: Non Reactive (02/07 0837)  HBsAg: Negative (11/28 1121)  HIV: Non Reactive (02/07 0837)  GBS: Negative/-- (04/16 1603)  2 hr Glucola normal Genetic screening  low risk Anatomy US normal  Prenatal Transfer Tool  Maternal Diabetes: No Genetic Screening: Normal Maternal Ultrasounds/Referrals: Normal Fetal Ultrasounds or other Referrals:  None Maternal Substance Abuse:  No Significant Maternal Medications:  None Significant Maternal Lab Results:  Group B Strep negative Number of Prenatal Visits:greater than 3 verified prenatal visits Other Comments:  None  Results for orders placed or performed during the hospital encounter of 11/06/22 (from the past 24 hour(s))  Fern Test   Collection Time: 11/06/22  2:13 PM  Result Value Ref  Range   POCT Fern Test Positive = ruptured amniotic membanes   CBC   Collection Time: 11/06/22  2:20 PM  Result Value Ref Range   WBC 7.6 4.0 - 10.5 K/uL   RBC 3.58 (L) 3.87 - 5.11 MIL/uL   Hemoglobin 10.6 (L) 12.0 - 15.0 g/dL   HCT 16.1 (L) 09.6 - 04.5 %   MCV 86.9 80.0 - 100.0 fL   MCH 29.6 26.0 - 34.0 pg  MCHC 34.1 30.0 - 36.0 g/dL   RDW 16.1 09.6 - 04.5 %   Platelets 330 150 - 400 K/uL   nRBC 0.0 0.0 - 0.2 %  Type and screen [facility]   Collection Time: 11/06/22  2:30 PM  Result Value Ref Range   ABO/RH(D) B POS    Antibody Screen NEG    Sample Expiration      11/09/2022,2359 Performed at [facility], [address] [place], [place] [address]     Patient Active Problem List   Diagnosis Date Noted   Normal labor 11/06/2022   Supervision of other normal pregnancy, antepartum 04/22/2022   Pregnancy with adoption planned, third trimester 09/05/2018   History of cryosurgery 08/30/2018   Closed displaced transverse fracture of shaft of left femur ([facility]) 09/12/2016   Closed supracondylar fracture of left femur (H[facility])     Assessment/Plan:  Erica Clements is a 32 y.o. [id number] at [redacted]w[redacted]d here following PROM at 1330 today. She is having painful contractions at this time and is making cervical change.  #Labor:Admit to labor and delivery for management. Plan to encourage spontaneous labor process.  #Pain: Epidural. Ok to get now #FWB: Cat 1 #ID:  GBS neg #MOF: breast #MOC:depo > plan for outpatient IUD #Circ:  N/a  Sheppard Evens MD MPH OB Fellow, Faculty Practice [facility], [facility] for [facility] 11/06/2022

## 2022-11-06 NOTE — Discharge Summary (Shared)
Postpartum Discharge Summary  Date of Service updated***     Patient Name: Erica Clements DOB: [birth year] MRN: [id number]  Date of admission: 11/06/2022 Delivery date:11/06/2022  Delivering provider: Alfredia Ferguson  Date of discharge: 11/06/2022  Admitting diagnosis: Normal labor [O80, Z37.9] Intrauterine pregnancy: [redacted]w[redacted]d     Secondary diagnosis:  Principal Problem:   Normal labor Active Problems:   Supervision of other normal pregnancy, antepartum  Additional problems: ***    Discharge diagnosis: {DX.:23714}                                              Post partum procedures:{Postpartum procedures:23558} Augmentation:  None Complications: None  Hospital course: Onset of Labor With Vaginal Delivery      32 y.o. yo [id number] at [redacted]w[redacted]d was admitted in Active Labor following SROM on 11/06/2022. Labor course was complicated by: none  Membrane Rupture Time/Date: 1:00 PM ,11/06/2022   Delivery Method:Vaginal, Spontaneous  Episiotomy: None  Lacerations:  None  Patient had a postpartum course complicated by ***.  She is ambulating, tolerating a regular diet, passing flatus, and urinating well. Patient is discharged home in stable condition on 11/06/22.  Newborn Data: Birth date:11/06/2022  Birth time:6:21 PM  Gender:Female  Living status:Living  Apgars:7 ,9  Weight:   Magnesium Sulfate received: {Mag received:30440022} BMZ received: No Rhophylac:N/A MMR:N/A T-DaP:{Tdap:23962}declined prenatally Flu: N/A Transfusion:{Transfusion received:30440034}  Physical exam  Vitals:   11/06/22 1701 11/06/22 1731 11/06/22 1840 11/06/22 1845  BP: 102/84 (!) 113/57 114/65 116/63  Pulse: (!) 101 93 (!) 156 88  Resp:      Temp:      TempSrc:      SpO2:       General: {Exam; general:21111117} Lochia: {Desc; appropriate/inappropriate:30686::"appropriate"} Uterine Fundus: {Desc; firm/soft:30687} Incision: {Exam; incision:21111123} DVT Evaluation: {Exam; dvt:2111122} Labs: Lab Results   Component Value Date   WBC 7.6 11/06/2022   HGB 10.6 (L) 11/06/2022   HCT 31.1 (L) 11/06/2022   MCV 86.9 11/06/2022   PLT 330 11/06/2022      Latest Ref Rng & Units 01/29/2022   10:42 AM  CMP  Glucose 70 - 99 mg/dL 83   BUN 6 - 20 mg/dL 10   Creatinine 1.47 - 1.00 mg/dL 8.29   Sodium 562 - 130 mmol/L 135   Potassium 3.5 - 5.1 mmol/L 3.5   Chloride 98 - 111 mmol/L 104   CO2 22 - 32 mmol/L 24   Calcium 8.9 - 10.3 mg/dL 8.9   Total Protein 6.5 - 8.1 g/dL 7.6   Total Bilirubin 0.3 - 1.2 mg/dL 0.5   Alkaline Phos 38 - 126 U/L 44   AST 15 - 41 U/L 14   ALT 0 - 44 U/L 14    Edinburgh Score:    09/23/2018   11:22 PM  Edinburgh Postnatal Depression Scale Screening Tool  I have been able to laugh and see the funny side of things. 0  I have looked forward with enjoyment to things. 0  I have blamed myself unnecessarily when things went wrong. 0  I have been anxious or worried for no good reason. 0  I have felt scared or panicky for no good reason. 0  Things have been getting on top of me. 1  I have been so unhappy that I have had difficulty sleeping. 0  I have felt sad or  miserable. 0  I have been so unhappy that I have been crying. 1  The thought of harming myself has occurred to me. 0  Edinburgh Postnatal Depression Scale Total 2     After visit meds:  Allergies as of 11/06/2022   No Known Allergies   Med Rec must be completed prior to using this [facility]***        Discharge home in stable condition Infant Feeding: {Baby feeding:23562} Infant Disposition:{CHL IP OB HOME WITH ZOXWRU:04540} Discharge instruction: per After Visit Summary and Postpartum booklet. Activity: Advance as tolerated. Pelvic rest for 6 weeks.  Diet: {OB JWJX:91478295} Future Appointments: Future Appointments  Date Time Provider Department Center  11/10/2022  2:30 PM Brock Bad, MD [facility] None  11/18/2022  6:45 AM [facility] SCHED ROOM [facility] None   Follow up Visit:  The following  message was sent to [facility] by Gwenyth Allegra, MD  Please schedule this patient for a In person postpartum visit in 6 weeks with the following provider: Any provider. Additional Postpartum F/U: none   Low risk pregnancy complicated by:  None Delivery mode:  Vaginal, Spontaneous  Anticipated Birth Control:  Depo for now, and then out patient IUD   11/06/2022 Alfredia Ferguson, MD

## 2022-11-06 NOTE — MAU Note (Signed)
Pt informed that the ultrasound is considered a limited OB ultrasound and is not intended to be a complete ultrasound exam.  Patient also informed that the ultrasound is not being completed with the intent of assessing for fetal or placental anomalies or any pelvic abnormalities.  Explained that the purpose of today's ultrasound is to assess for presentation.  Patient acknowledges the purpose of the exam and the limitations of the study.    Vertex presentation confirmed 

## 2022-11-07 LAB — RPR: RPR Ser Ql: NONREACTIVE

## 2022-11-07 NOTE — Progress Notes (Signed)
This RN in PT"S room when pt said she felt dizzy. Lied pt down and assessed vitals. See flowsheet. All vitals normal. Will reassess pt at next rounds. Call bell at pt's bedside

## 2022-11-07 NOTE — Progress Notes (Signed)
POSTPARTUM PROGRESS NOTE  Post Partum Day 1  Subjective:  Erica Clements is a 32 y.o. [id number] s/p SVD at [redacted]w[redacted]d.  She reports she is doing well. No acute events overnight. She denies any problems with ambulating, voiding or po intake. Denies nausea or vomiting.  Pain is well controlled.  Lochia is minimal.  Objective: Blood pressure 115/77, pulse 84, temperature 97.8 F (36.6 C), temperature source Oral, resp. rate 16, last menstrual period 02/12/2022, SpO2 98 %, unknown if currently breastfeeding.  Physical Exam:  General: alert, cooperative and no distress Chest: no respiratory distress Heart:regular rate, distal pulses intact Abdomen: soft, nontender,  Uterine Fundus: firm, appropriately tender DVT Evaluation: No calf swelling or tenderness Extremities: mild edema Skin: warm, dry  Recent Labs    11/06/22 1420  HGB 10.6*  HCT 31.1*    Assessment/Plan: Erica Clements is a 32 y.o. [id number] s/p SVD at [redacted]w[redacted]d   PPD#1 - Doing well  Routine postpartum care Contraception: depo on discharge, > outpatient IUD Feeding: breast Dispo: Plan for discharge tomorrow.   LOS: 1 day   Sheppard Evens MD MPH OB Fellow, Faculty Practice [facility] 11/07/2022

## 2022-11-07 NOTE — Anesthesia Postprocedure Evaluation (Signed)
Anesthesia Post Note  Patient: Erica Clements  Procedure(s) Performed: AN AD HOC LABOR EPIDURAL     Patient location during evaluation: Mother Baby Anesthesia Type: Epidural Level of consciousness: awake Pain management: satisfactory to patient Vital Signs Assessment: post-procedure vital signs reviewed and stable Respiratory status: spontaneous breathing Cardiovascular status: stable Anesthetic complications: no  No notable events documented.  Last Vitals:  Vitals:   11/07/22 0200 11/07/22 0558  BP: 114/62 112/73  Pulse: 83 74  Resp: 18 16  Temp: 37 C 36.6 C  SpO2: 100% 98%    Last Pain:  Vitals:   11/07/22 0558  TempSrc: Oral  PainSc:    Pain Goal:                   Cephus Shelling

## 2022-11-07 NOTE — Lactation Note (Signed)
This note was copied from a baby's chart. Lactation Consultation Note  Patient Name: Erica Clements Date: 11/07/2022 Age:31 hours Reason for consult: Follow-up assessment  LC in to room to deliver stork pump. Lactating parent is Adult nurse. LP reports no issues with feedings.   Contact LC as needed for feeds/support/concerns/questions. All questions answered at this time.    Feeding Mother's Current Feeding Choice: Breast Milk and Formula Nipple Type: Slow - flow  Interventions Interventions: DEBP;Expressed milk;Education  Discharge Pump: Stork Pump  Consult Status Consult Status: Follow-up Date: 11/08/22 Follow-up type: In-patient    Erica Clements A [name] 11/07/2022, 1:17 PM

## 2022-11-07 NOTE — Lactation Note (Signed)
This note was copied from a baby's chart. Lactation Consultation Note  Patient Name: Erica Clements [id number] Date: 11/07/2022 Age:31 hours Reason for consult: Initial assessment;1st time breastfeeding;Term  Per Birth Parent, this is her 1st time breastfeeding, she did not BF her 1st and 2nd child.  Per Birth Parent she wants to breast and formula feed infant. Per Birth Parent, infant latched for 30 minutes in L&D but LC did not see documentation of feeding in infant's flow sheet. Birth Parent is concern she doesn't have colostrum, LC did not observe latch, infant recently formula fed 10 mls of 20 kcal formula. LC encouraged Birth Parent to latch infant first for every feeding to help stimulate and establish her milk supply. Birth Parent given hand pump to  pre-pump breast prior to latching infant  to help evert nipple shaft out more due having inverted nipples. LC discussed infant's input and output. LC discussed importance of maternal rest, diet and hydration. [employer] taught birth Parent hand expression using breat model and Birth Parent self express seeing that she does have colostrum . Birth Parent was  made aware of O/P services, breastfeeding support groups, community resources, and our phone # for post-discharge questions.    Birth Parent current feeding plan: 1- Birth Parent will pre-pump with hand pump prior to latching infant at the breast and  BF infant by cues, on demand, 8 to 12+ times within 24 hours, STS 2- Ask for latch assistance from RN/LC if needed. 3- Birth Parent feeding choice, after latching infant at the breast to supplement with formula. Birth Parent was given hand out  " Supplementing with Breastfeeding," Birth Parent knows on Day 1 after latching infant at the breast to supplement with (5-7 mls) of formula but if infant doesn't latch to offer 10 mls of formula or more if infant wants it.    Maternal Data Has patient been taught Hand Expression?: Yes Does the patient have  breastfeeding experience prior to this delivery?: No  Feeding Mother's Current Feeding Choice: Breast Milk and Formula Nipple Type: Slow - flow  LATCH Score  LC did not observe latch at this time.                  Lactation Tools Discussed/Used Tools: Pump Breast pump type: Manual Pump Education: Setup, frequency, and cleaning;Milk Storage Reason for Pumping: pre-pump prior to latching infant at the breast due to having inverted nipples  Interventions    Discharge Pump: Manual (LC sent referral for Ephriam Knuckles)  Consult Status Consult Status: Follow-up Date: 11/07/22 Follow-up type: In-patient    Frederico Hamman 11/07/2022, 1:39 AM

## 2022-11-08 MED ORDER — IBUPROFEN 600 MG PO TABS: 600.0000 mg | ORAL_TABLET | Freq: Four times a day (QID) | ORAL | 0 refills | Status: AC

## 2022-11-08 MED ORDER — ACETAMINOPHEN 325 MG PO TABS: 650.0000 mg | ORAL_TABLET | ORAL | 0 refills | Status: AC | PRN

## 2022-11-08 NOTE — Lactation Note (Signed)
This note was copied from a baby's chart. Lactation Consultation Note  Patient Name: Girl Ottis Critcher [id number] Date: 11/08/2022 Age:32 years Reason for consult: Follow-up assessment;Maternal discharge;Infant weight loss (-0.33 weight loss).  P4, term female infant mostly being formula feed, infant is consuming 30 to 45 mls of formula per feeding. Birth Parent BF infant twice today for 30 minutes. Given Stork DEBP ( Spectra 2). Birth Parent doesn't have any BF questions or concerns for LC at this time. Plans to latch infant more at the breast, Birth Parent has breastfeeding supplemental guidelines regarding volume amounts when breast and formula feeding infant.  Birth Parent will continue to BF infant according to hunger cues, on demand, 8 to 12+ times within 24 hours, STS.   LC discussed discharge education: Engorgement Prevention and Treatment, Warning signs of dehydration in infant, how to know if breastfeeding is going well . Birth Parent knows of community support: LC hotline, LC outpatient clinic, LC BF support group in person and online.   Maternal Data    Feeding Mother's Current Feeding Choice: Breast Milk and Formula Nipple Type: Slow - flow  LATCH Score                    Lactation Tools Discussed/Used    Interventions Interventions: Education;DEBP;Pace feeding  Discharge Discharge Education: Warning signs for feeding baby;Engorgement and breast care;Outpatient recommendation Pump: Stork Pump  Consult Status Consult Status: Complete Date: 11/08/22 Follow-up type: In-patient    Frederico Hamman 11/08/2022, 2:07 PM

## 2022-11-10 ENCOUNTER — Encounter: Payer: Medicaid Other | Admitting: Obstetrics

## 2022-11-16 ENCOUNTER — Telehealth (HOSPITAL_COMMUNITY): Payer: Self-pay | Admitting: *Deleted

## 2022-11-16 NOTE — Telephone Encounter (Signed)
Attempted hospital discharge follow-up call. Left message for patient to return RN call with any questions or concerns. Deforest Hoyles, RN, 11/16/22, [phone]

## 2022-11-18 ENCOUNTER — Inpatient Hospital Stay (HOSPITAL_COMMUNITY): Payer: Medicaid Other

## 2022-12-21 ENCOUNTER — Ambulatory Visit: Payer: Medicaid Other | Admitting: Obstetrics and Gynecology

## 2023-02-28 ENCOUNTER — Ambulatory Visit (INDEPENDENT_AMBULATORY_CARE_PROVIDER_SITE_OTHER): Payer: Medicaid Other | Admitting: Emergency Medicine

## 2023-02-28 VITALS — BP 115/70 | HR 80 | Wt 152.2 lb

## 2023-02-28 DIAGNOSIS — R350 Frequency of micturition: Secondary | ICD-10-CM | POA: Diagnosis not present

## 2023-02-28 DIAGNOSIS — R300 Dysuria: Secondary | ICD-10-CM | POA: Diagnosis not present

## 2023-02-28 DIAGNOSIS — Z3202 Encounter for pregnancy test, result negative: Secondary | ICD-10-CM | POA: Diagnosis not present

## 2023-02-28 LAB — POCT URINALYSIS DIPSTICK
Bilirubin, UA: NEGATIVE
Blood, UA: NEGATIVE
Glucose, UA: NEGATIVE
Ketones, UA: NEGATIVE
Nitrite, UA: NEGATIVE
Protein, UA: NEGATIVE
Spec Grav, UA: 1.015 (ref 1.010–1.025)
Urobilinogen, UA: 0.2 E.U./dL
pH, UA: 6 (ref 5.0–8.0)

## 2023-02-28 LAB — POCT URINE PREGNANCY: Preg Test, Ur: NEGATIVE

## 2023-02-28 NOTE — Progress Notes (Signed)
SUBJECTIVE: Erica Clements is a 32 y.o. female who complains of urinary frequency, urgency and dysuria x 7 days, without flank pain, fever, chills, or abnormal vaginal discharge or bleeding.   OBJECTIVE: Appears well, in no apparent distress.  Vital signs are normal. Urine dipstick shows positive for WBC's.    ASSESSMENT: Dysuria  PLAN: Treatment per orders.  Call or return to clinic prn if these symptoms worsen or fail to improve as anticipated.

## 2023-07-12 ENCOUNTER — Ambulatory Visit: Payer: Medicaid Other | Admitting: Obstetrics

## 2023-07-21 ENCOUNTER — Ambulatory Visit: Payer: Medicaid Other | Admitting: Obstetrics & Gynecology

## 2023-07-27 ENCOUNTER — Encounter: Payer: Self-pay | Admitting: Obstetrics & Gynecology

## 2024-08-15 ENCOUNTER — Ambulatory Visit: Payer: Self-pay | Admitting: Obstetrics
